# Patient Record
Sex: Female | Born: 1945
Health system: Southern US, Community
[De-identification: ages and names within clinical notes are randomized; demographics above are authoritative.]

## PROBLEM LIST (undated history)

## (undated) DIAGNOSIS — J45909 Unspecified asthma, uncomplicated: Secondary | ICD-10-CM

## (undated) DIAGNOSIS — G4730 Sleep apnea, unspecified: Secondary | ICD-10-CM

## (undated) DIAGNOSIS — E119 Type 2 diabetes mellitus without complications: Secondary | ICD-10-CM

## (undated) DIAGNOSIS — M1990 Unspecified osteoarthritis, unspecified site: Secondary | ICD-10-CM

## (undated) HISTORY — PX: ABDOMINAL HYSTERECTOMY: SHX81

## (undated) HISTORY — DX: Unspecified asthma, uncomplicated: J45.909

## (undated) HISTORY — PX: BACK SURGERY: SHX140

## (undated) HISTORY — PX: BREAST SURGERY: SHX581

## (undated) HISTORY — PX: NASAL SINUS SURGERY: SHX719

## (undated) HISTORY — DX: Type 2 diabetes mellitus without complications: E11.9

## (undated) HISTORY — DX: Unspecified osteoarthritis, unspecified site: M19.90

## (undated) HISTORY — PX: CHOLECYSTECTOMY: SHX55

## (undated) HISTORY — PX: SHOULDER ARTHROSCOPY W/ ROTATOR CUFF REPAIR: SHX2400

---

## 1999-03-13 DIAGNOSIS — C4491 Basal cell carcinoma of skin, unspecified: Secondary | ICD-10-CM

## 1999-03-13 HISTORY — DX: Basal cell carcinoma of skin, unspecified: C44.91

## 2001-03-04 ENCOUNTER — Other Ambulatory Visit: Admission: RE | Admit: 2001-03-04 | Discharge: 2001-03-04 | Payer: Self-pay | Admitting: Obstetrics and Gynecology

## 2004-10-12 ENCOUNTER — Ambulatory Visit: Payer: Self-pay | Admitting: Internal Medicine

## 2004-10-12 ENCOUNTER — Ambulatory Visit (HOSPITAL_COMMUNITY): Admission: RE | Admit: 2004-10-12 | Discharge: 2004-10-12 | Payer: Self-pay | Admitting: Internal Medicine

## 2006-03-21 DIAGNOSIS — D229 Melanocytic nevi, unspecified: Secondary | ICD-10-CM

## 2006-03-21 HISTORY — DX: Melanocytic nevi, unspecified: D22.9

## 2007-09-22 ENCOUNTER — Other Ambulatory Visit: Admission: RE | Admit: 2007-09-22 | Discharge: 2007-09-22 | Payer: Self-pay | Admitting: Obstetrics and Gynecology

## 2007-12-01 ENCOUNTER — Ambulatory Visit: Payer: Self-pay | Admitting: Cardiology

## 2010-05-07 ENCOUNTER — Ambulatory Visit: Payer: Self-pay | Admitting: Cardiology

## 2010-12-21 NOTE — Op Note (Signed)
NAME:  Dawn Anderson, Dawn Anderson               ACCOUNT NO.:  [id number]   MEDICAL RECORD NO.:  [id number]          PATIENT TYPE:  AMB   LOCATION:  DAY                           FACILITY:  [facility]   PHYSICIAN:  Lionel December, M.D.    DATE OF BIRTH:  [birth year]   DATE OF PROCEDURE:  10/12/2004  DATE OF DISCHARGE:                                 OPERATIVE REPORT   PROCEDURE:  Total colonoscopy.   INDICATIONS:  Dawn Anderson is a 65 year old Caucasian female who is here for  surveillance colonoscopy.  She had two small tubular adenomas removed from  her colon, last exam of January 2001.  Family history is negative for colon  carcinoma, but her mother has had polyps as well.   The procedure risks were reviewed the patient, informed consent was  obtained.   PREMEDICATION:  Demerol 25 mg IV, Versed 8 mg IV in divided dose.   FINDINGS:  Procedure performed in endoscopy suite.  The patient's vital  signs and O2 saturation were monitored during procedure and remained stable.  The patient was placed in the left lateral recumbent position and rectal  examination performed.  No abnormality noted on an external or digital exam.  The Olympus video scope was placed in the rectum and advanced under vision  into sigmoid colon and beyond.  Preparation was satisfactory.  She had mild  pigmentation mainly in distal half the colon, consistent with melanosis  coli.  Redundant colon.  The scope was advanced into cecum, which was  identified by appendiceal orifice and ileocecal valve.  Pictures taken for  the record.  As the scope was withdrawn, colonic mucosa was carefully  examined and there was only a small polyp at rectum, which was ablated via  cold biopsy, and a tiny bump next to it.  The scope was retroflexed to  examine anorectal junction, and small hemorrhoids were noted below the  dentate line.  Endoscope was straightened and withdrawn.  The patient  tolerated the procedure well.   FINAL DIAGNOSES:  1.  Redundant  colon with mild melanosis coli.  2.  Single small rectal polyp, which was ablated via cold biopsy.  3.  Small external hemorrhoids.   RECOMMENDATIONS:  1.  High-fiber diet. She will continue Metamucil or equivalent one      tablespoonful daily.  2.  I will contact the patient with biopsy results and will bring her back      in five years from now.      NR/MEDQ  D:  10/12/2004  T:  10/12/2004  Job:  [id number]   cc:   Selinda Flavin  [address]  [place]  [place] [address]  Fax: [phone]

## 2011-08-07 DIAGNOSIS — H918X9 Other specified hearing loss, unspecified ear: Secondary | ICD-10-CM | POA: Diagnosis not present

## 2011-08-07 DIAGNOSIS — R259 Unspecified abnormal involuntary movements: Secondary | ICD-10-CM | POA: Diagnosis not present

## 2011-08-07 DIAGNOSIS — R0609 Other forms of dyspnea: Secondary | ICD-10-CM | POA: Diagnosis not present

## 2011-08-13 DIAGNOSIS — D2370 Other benign neoplasm of skin of unspecified lower limb, including hip: Secondary | ICD-10-CM | POA: Diagnosis not present

## 2011-08-13 DIAGNOSIS — D485 Neoplasm of uncertain behavior of skin: Secondary | ICD-10-CM | POA: Diagnosis not present

## 2011-08-13 DIAGNOSIS — L820 Inflamed seborrheic keratosis: Secondary | ICD-10-CM | POA: Diagnosis not present

## 2011-08-15 DIAGNOSIS — J45909 Unspecified asthma, uncomplicated: Secondary | ICD-10-CM | POA: Diagnosis not present

## 2011-08-27 DIAGNOSIS — J310 Chronic rhinitis: Secondary | ICD-10-CM | POA: Diagnosis not present

## 2011-08-27 DIAGNOSIS — J3089 Other allergic rhinitis: Secondary | ICD-10-CM | POA: Diagnosis not present

## 2011-08-27 DIAGNOSIS — J301 Allergic rhinitis due to pollen: Secondary | ICD-10-CM | POA: Diagnosis not present

## 2011-08-27 DIAGNOSIS — J45909 Unspecified asthma, uncomplicated: Secondary | ICD-10-CM | POA: Diagnosis not present

## 2011-09-12 DIAGNOSIS — J45909 Unspecified asthma, uncomplicated: Secondary | ICD-10-CM | POA: Diagnosis not present

## 2011-09-26 DIAGNOSIS — J3089 Other allergic rhinitis: Secondary | ICD-10-CM | POA: Diagnosis not present

## 2011-10-18 DIAGNOSIS — J3089 Other allergic rhinitis: Secondary | ICD-10-CM | POA: Diagnosis not present

## 2011-10-18 DIAGNOSIS — J45909 Unspecified asthma, uncomplicated: Secondary | ICD-10-CM | POA: Diagnosis not present

## 2011-11-06 DIAGNOSIS — M999 Biomechanical lesion, unspecified: Secondary | ICD-10-CM | POA: Diagnosis not present

## 2011-11-06 DIAGNOSIS — S335XXA Sprain of ligaments of lumbar spine, initial encounter: Secondary | ICD-10-CM | POA: Diagnosis not present

## 2011-11-21 DIAGNOSIS — J45909 Unspecified asthma, uncomplicated: Secondary | ICD-10-CM | POA: Diagnosis not present

## 2011-11-26 DIAGNOSIS — Z1212 Encounter for screening for malignant neoplasm of rectum: Secondary | ICD-10-CM | POA: Diagnosis not present

## 2011-11-26 DIAGNOSIS — Z124 Encounter for screening for malignant neoplasm of cervix: Secondary | ICD-10-CM | POA: Diagnosis not present

## 2011-11-26 DIAGNOSIS — Z1389 Encounter for screening for other disorder: Secondary | ICD-10-CM | POA: Diagnosis not present

## 2011-12-03 DIAGNOSIS — J3081 Allergic rhinitis due to animal (cat) (dog) hair and dander: Secondary | ICD-10-CM | POA: Diagnosis not present

## 2011-12-03 DIAGNOSIS — J45909 Unspecified asthma, uncomplicated: Secondary | ICD-10-CM | POA: Diagnosis not present

## 2011-12-03 DIAGNOSIS — J310 Chronic rhinitis: Secondary | ICD-10-CM | POA: Diagnosis not present

## 2011-12-03 DIAGNOSIS — J3089 Other allergic rhinitis: Secondary | ICD-10-CM | POA: Diagnosis not present

## 2011-12-03 DIAGNOSIS — J301 Allergic rhinitis due to pollen: Secondary | ICD-10-CM | POA: Diagnosis not present

## 2011-12-09 DIAGNOSIS — M999 Biomechanical lesion, unspecified: Secondary | ICD-10-CM | POA: Diagnosis not present

## 2011-12-09 DIAGNOSIS — S335XXA Sprain of ligaments of lumbar spine, initial encounter: Secondary | ICD-10-CM | POA: Diagnosis not present

## 2011-12-23 DIAGNOSIS — J45909 Unspecified asthma, uncomplicated: Secondary | ICD-10-CM | POA: Diagnosis not present

## 2011-12-31 DIAGNOSIS — M999 Biomechanical lesion, unspecified: Secondary | ICD-10-CM | POA: Diagnosis not present

## 2011-12-31 DIAGNOSIS — S335XXA Sprain of ligaments of lumbar spine, initial encounter: Secondary | ICD-10-CM | POA: Diagnosis not present

## 2012-01-09 DIAGNOSIS — Z1231 Encounter for screening mammogram for malignant neoplasm of breast: Secondary | ICD-10-CM | POA: Diagnosis not present

## 2012-01-16 DIAGNOSIS — J45909 Unspecified asthma, uncomplicated: Secondary | ICD-10-CM | POA: Diagnosis not present

## 2012-02-03 DIAGNOSIS — M47817 Spondylosis without myelopathy or radiculopathy, lumbosacral region: Secondary | ICD-10-CM | POA: Diagnosis not present

## 2012-02-03 DIAGNOSIS — M5430 Sciatica, unspecified side: Secondary | ICD-10-CM | POA: Diagnosis not present

## 2012-02-03 DIAGNOSIS — M999 Biomechanical lesion, unspecified: Secondary | ICD-10-CM | POA: Diagnosis not present

## 2012-02-14 DIAGNOSIS — J45909 Unspecified asthma, uncomplicated: Secondary | ICD-10-CM | POA: Diagnosis not present

## 2012-03-09 DIAGNOSIS — M47817 Spondylosis without myelopathy or radiculopathy, lumbosacral region: Secondary | ICD-10-CM | POA: Diagnosis not present

## 2012-03-09 DIAGNOSIS — M999 Biomechanical lesion, unspecified: Secondary | ICD-10-CM | POA: Diagnosis not present

## 2012-03-17 DIAGNOSIS — L570 Actinic keratosis: Secondary | ICD-10-CM | POA: Diagnosis not present

## 2012-03-18 DIAGNOSIS — J45909 Unspecified asthma, uncomplicated: Secondary | ICD-10-CM | POA: Diagnosis not present

## 2012-03-23 DIAGNOSIS — M47817 Spondylosis without myelopathy or radiculopathy, lumbosacral region: Secondary | ICD-10-CM | POA: Diagnosis not present

## 2012-03-23 DIAGNOSIS — M999 Biomechanical lesion, unspecified: Secondary | ICD-10-CM | POA: Diagnosis not present

## 2012-03-25 DIAGNOSIS — M999 Biomechanical lesion, unspecified: Secondary | ICD-10-CM | POA: Diagnosis not present

## 2012-03-25 DIAGNOSIS — M47817 Spondylosis without myelopathy or radiculopathy, lumbosacral region: Secondary | ICD-10-CM | POA: Diagnosis not present

## 2012-03-27 DIAGNOSIS — M999 Biomechanical lesion, unspecified: Secondary | ICD-10-CM | POA: Diagnosis not present

## 2012-03-27 DIAGNOSIS — M47817 Spondylosis without myelopathy or radiculopathy, lumbosacral region: Secondary | ICD-10-CM | POA: Diagnosis not present

## 2012-03-30 DIAGNOSIS — M47817 Spondylosis without myelopathy or radiculopathy, lumbosacral region: Secondary | ICD-10-CM | POA: Diagnosis not present

## 2012-03-30 DIAGNOSIS — M999 Biomechanical lesion, unspecified: Secondary | ICD-10-CM | POA: Diagnosis not present

## 2012-04-01 DIAGNOSIS — M47817 Spondylosis without myelopathy or radiculopathy, lumbosacral region: Secondary | ICD-10-CM | POA: Diagnosis not present

## 2012-04-01 DIAGNOSIS — M999 Biomechanical lesion, unspecified: Secondary | ICD-10-CM | POA: Diagnosis not present

## 2012-04-14 DIAGNOSIS — L570 Actinic keratosis: Secondary | ICD-10-CM | POA: Diagnosis not present

## 2012-04-20 DIAGNOSIS — J45909 Unspecified asthma, uncomplicated: Secondary | ICD-10-CM | POA: Diagnosis not present

## 2012-04-21 DIAGNOSIS — L820 Inflamed seborrheic keratosis: Secondary | ICD-10-CM | POA: Diagnosis not present

## 2012-04-21 DIAGNOSIS — D235 Other benign neoplasm of skin of trunk: Secondary | ICD-10-CM | POA: Diagnosis not present

## 2012-04-21 DIAGNOSIS — D239 Other benign neoplasm of skin, unspecified: Secondary | ICD-10-CM | POA: Diagnosis not present

## 2012-04-21 DIAGNOSIS — D179 Benign lipomatous neoplasm, unspecified: Secondary | ICD-10-CM | POA: Diagnosis not present

## 2012-04-23 DIAGNOSIS — J45909 Unspecified asthma, uncomplicated: Secondary | ICD-10-CM | POA: Diagnosis not present

## 2012-04-23 DIAGNOSIS — J3089 Other allergic rhinitis: Secondary | ICD-10-CM | POA: Diagnosis not present

## 2012-04-23 DIAGNOSIS — J339 Nasal polyp, unspecified: Secondary | ICD-10-CM | POA: Diagnosis not present

## 2012-04-23 DIAGNOSIS — T39095A Adverse effect of salicylates, initial encounter: Secondary | ICD-10-CM | POA: Diagnosis not present

## 2012-04-28 DIAGNOSIS — L570 Actinic keratosis: Secondary | ICD-10-CM | POA: Diagnosis not present

## 2012-05-07 DIAGNOSIS — Z23 Encounter for immunization: Secondary | ICD-10-CM | POA: Diagnosis not present

## 2012-05-18 DIAGNOSIS — J45909 Unspecified asthma, uncomplicated: Secondary | ICD-10-CM | POA: Diagnosis not present

## 2012-05-26 DIAGNOSIS — L570 Actinic keratosis: Secondary | ICD-10-CM | POA: Diagnosis not present

## 2012-06-02 DIAGNOSIS — H2510 Age-related nuclear cataract, unspecified eye: Secondary | ICD-10-CM | POA: Diagnosis not present

## 2012-06-02 DIAGNOSIS — H40009 Preglaucoma, unspecified, unspecified eye: Secondary | ICD-10-CM | POA: Diagnosis not present

## 2012-06-11 DIAGNOSIS — S335XXA Sprain of ligaments of lumbar spine, initial encounter: Secondary | ICD-10-CM | POA: Diagnosis not present

## 2012-06-11 DIAGNOSIS — M999 Biomechanical lesion, unspecified: Secondary | ICD-10-CM | POA: Diagnosis not present

## 2012-06-11 DIAGNOSIS — M47817 Spondylosis without myelopathy or radiculopathy, lumbosacral region: Secondary | ICD-10-CM | POA: Diagnosis not present

## 2012-06-16 DIAGNOSIS — L570 Actinic keratosis: Secondary | ICD-10-CM | POA: Diagnosis not present

## 2012-06-18 DIAGNOSIS — J45909 Unspecified asthma, uncomplicated: Secondary | ICD-10-CM | POA: Diagnosis not present

## 2012-07-07 DIAGNOSIS — L570 Actinic keratosis: Secondary | ICD-10-CM | POA: Diagnosis not present

## 2012-07-16 DIAGNOSIS — J45909 Unspecified asthma, uncomplicated: Secondary | ICD-10-CM | POA: Diagnosis not present

## 2012-07-30 ENCOUNTER — Encounter (INDEPENDENT_AMBULATORY_CARE_PROVIDER_SITE_OTHER): Payer: Self-pay

## 2012-08-03 DIAGNOSIS — S335XXA Sprain of ligaments of lumbar spine, initial encounter: Secondary | ICD-10-CM | POA: Diagnosis not present

## 2012-08-03 DIAGNOSIS — L570 Actinic keratosis: Secondary | ICD-10-CM | POA: Diagnosis not present

## 2012-08-03 DIAGNOSIS — M999 Biomechanical lesion, unspecified: Secondary | ICD-10-CM | POA: Diagnosis not present

## 2012-08-03 DIAGNOSIS — M47817 Spondylosis without myelopathy or radiculopathy, lumbosacral region: Secondary | ICD-10-CM | POA: Diagnosis not present

## 2012-08-17 DIAGNOSIS — L570 Actinic keratosis: Secondary | ICD-10-CM | POA: Diagnosis not present

## 2012-08-21 DIAGNOSIS — R0989 Other specified symptoms and signs involving the circulatory and respiratory systems: Secondary | ICD-10-CM | POA: Diagnosis not present

## 2012-08-21 DIAGNOSIS — I6529 Occlusion and stenosis of unspecified carotid artery: Secondary | ICD-10-CM | POA: Diagnosis not present

## 2012-09-01 DIAGNOSIS — S335XXA Sprain of ligaments of lumbar spine, initial encounter: Secondary | ICD-10-CM | POA: Diagnosis not present

## 2012-09-01 DIAGNOSIS — M47817 Spondylosis without myelopathy or radiculopathy, lumbosacral region: Secondary | ICD-10-CM | POA: Diagnosis not present

## 2012-09-01 DIAGNOSIS — M999 Biomechanical lesion, unspecified: Secondary | ICD-10-CM | POA: Diagnosis not present

## 2012-09-15 DIAGNOSIS — L570 Actinic keratosis: Secondary | ICD-10-CM | POA: Diagnosis not present

## 2012-10-01 DIAGNOSIS — H6120 Impacted cerumen, unspecified ear: Secondary | ICD-10-CM | POA: Diagnosis not present

## 2012-10-01 DIAGNOSIS — H6980 Other specified disorders of Eustachian tube, unspecified ear: Secondary | ICD-10-CM | POA: Diagnosis not present

## 2012-10-06 DIAGNOSIS — L570 Actinic keratosis: Secondary | ICD-10-CM | POA: Diagnosis not present

## 2012-10-12 DIAGNOSIS — M999 Biomechanical lesion, unspecified: Secondary | ICD-10-CM | POA: Diagnosis not present

## 2012-10-12 DIAGNOSIS — M47817 Spondylosis without myelopathy or radiculopathy, lumbosacral region: Secondary | ICD-10-CM | POA: Diagnosis not present

## 2012-10-12 DIAGNOSIS — M5430 Sciatica, unspecified side: Secondary | ICD-10-CM | POA: Diagnosis not present

## 2012-10-27 DIAGNOSIS — L570 Actinic keratosis: Secondary | ICD-10-CM | POA: Diagnosis not present

## 2012-10-29 DIAGNOSIS — M5430 Sciatica, unspecified side: Secondary | ICD-10-CM | POA: Diagnosis not present

## 2012-10-29 DIAGNOSIS — M999 Biomechanical lesion, unspecified: Secondary | ICD-10-CM | POA: Diagnosis not present

## 2012-10-29 DIAGNOSIS — M47817 Spondylosis without myelopathy or radiculopathy, lumbosacral region: Secondary | ICD-10-CM | POA: Diagnosis not present

## 2012-11-02 DIAGNOSIS — M47817 Spondylosis without myelopathy or radiculopathy, lumbosacral region: Secondary | ICD-10-CM | POA: Diagnosis not present

## 2012-11-02 DIAGNOSIS — M5430 Sciatica, unspecified side: Secondary | ICD-10-CM | POA: Diagnosis not present

## 2012-11-02 DIAGNOSIS — M999 Biomechanical lesion, unspecified: Secondary | ICD-10-CM | POA: Diagnosis not present

## 2012-11-03 DIAGNOSIS — M47817 Spondylosis without myelopathy or radiculopathy, lumbosacral region: Secondary | ICD-10-CM | POA: Diagnosis not present

## 2012-11-03 DIAGNOSIS — M999 Biomechanical lesion, unspecified: Secondary | ICD-10-CM | POA: Diagnosis not present

## 2012-11-03 DIAGNOSIS — M5430 Sciatica, unspecified side: Secondary | ICD-10-CM | POA: Diagnosis not present

## 2012-11-05 DIAGNOSIS — M5430 Sciatica, unspecified side: Secondary | ICD-10-CM | POA: Diagnosis not present

## 2012-11-05 DIAGNOSIS — M47817 Spondylosis without myelopathy or radiculopathy, lumbosacral region: Secondary | ICD-10-CM | POA: Diagnosis not present

## 2012-11-05 DIAGNOSIS — M999 Biomechanical lesion, unspecified: Secondary | ICD-10-CM | POA: Diagnosis not present

## 2012-11-10 DIAGNOSIS — M47817 Spondylosis without myelopathy or radiculopathy, lumbosacral region: Secondary | ICD-10-CM | POA: Diagnosis not present

## 2012-11-10 DIAGNOSIS — M5430 Sciatica, unspecified side: Secondary | ICD-10-CM | POA: Diagnosis not present

## 2012-11-10 DIAGNOSIS — M999 Biomechanical lesion, unspecified: Secondary | ICD-10-CM | POA: Diagnosis not present

## 2012-11-17 DIAGNOSIS — M5430 Sciatica, unspecified side: Secondary | ICD-10-CM | POA: Diagnosis not present

## 2012-11-17 DIAGNOSIS — M47817 Spondylosis without myelopathy or radiculopathy, lumbosacral region: Secondary | ICD-10-CM | POA: Diagnosis not present

## 2012-11-17 DIAGNOSIS — M999 Biomechanical lesion, unspecified: Secondary | ICD-10-CM | POA: Diagnosis not present

## 2012-11-18 DIAGNOSIS — G4700 Insomnia, unspecified: Secondary | ICD-10-CM | POA: Diagnosis not present

## 2012-11-18 DIAGNOSIS — Z23 Encounter for immunization: Secondary | ICD-10-CM | POA: Diagnosis not present

## 2012-11-18 DIAGNOSIS — J45909 Unspecified asthma, uncomplicated: Secondary | ICD-10-CM | POA: Diagnosis not present

## 2012-11-19 DIAGNOSIS — J45909 Unspecified asthma, uncomplicated: Secondary | ICD-10-CM | POA: Diagnosis not present

## 2012-11-19 DIAGNOSIS — J3089 Other allergic rhinitis: Secondary | ICD-10-CM | POA: Diagnosis not present

## 2012-11-19 DIAGNOSIS — G4700 Insomnia, unspecified: Secondary | ICD-10-CM | POA: Diagnosis not present

## 2012-11-19 DIAGNOSIS — T39095A Adverse effect of salicylates, initial encounter: Secondary | ICD-10-CM | POA: Diagnosis not present

## 2012-11-19 DIAGNOSIS — J339 Nasal polyp, unspecified: Secondary | ICD-10-CM | POA: Diagnosis not present

## 2012-12-01 DIAGNOSIS — L570 Actinic keratosis: Secondary | ICD-10-CM | POA: Diagnosis not present

## 2012-12-07 DIAGNOSIS — M47817 Spondylosis without myelopathy or radiculopathy, lumbosacral region: Secondary | ICD-10-CM | POA: Diagnosis not present

## 2012-12-07 DIAGNOSIS — M999 Biomechanical lesion, unspecified: Secondary | ICD-10-CM | POA: Diagnosis not present

## 2012-12-07 DIAGNOSIS — M5430 Sciatica, unspecified side: Secondary | ICD-10-CM | POA: Diagnosis not present

## 2012-12-08 DIAGNOSIS — M5430 Sciatica, unspecified side: Secondary | ICD-10-CM | POA: Diagnosis not present

## 2012-12-08 DIAGNOSIS — M999 Biomechanical lesion, unspecified: Secondary | ICD-10-CM | POA: Diagnosis not present

## 2012-12-08 DIAGNOSIS — M47817 Spondylosis without myelopathy or radiculopathy, lumbosacral region: Secondary | ICD-10-CM | POA: Diagnosis not present

## 2012-12-16 DIAGNOSIS — J45909 Unspecified asthma, uncomplicated: Secondary | ICD-10-CM | POA: Diagnosis not present

## 2013-01-05 DIAGNOSIS — M5430 Sciatica, unspecified side: Secondary | ICD-10-CM | POA: Diagnosis not present

## 2013-01-05 DIAGNOSIS — M47817 Spondylosis without myelopathy or radiculopathy, lumbosacral region: Secondary | ICD-10-CM | POA: Diagnosis not present

## 2013-01-05 DIAGNOSIS — M999 Biomechanical lesion, unspecified: Secondary | ICD-10-CM | POA: Diagnosis not present

## 2013-01-05 DIAGNOSIS — M722 Plantar fascial fibromatosis: Secondary | ICD-10-CM | POA: Diagnosis not present

## 2013-01-11 DIAGNOSIS — Z1231 Encounter for screening mammogram for malignant neoplasm of breast: Secondary | ICD-10-CM | POA: Diagnosis not present

## 2013-01-14 DIAGNOSIS — J45909 Unspecified asthma, uncomplicated: Secondary | ICD-10-CM | POA: Diagnosis not present

## 2013-02-16 DIAGNOSIS — J45909 Unspecified asthma, uncomplicated: Secondary | ICD-10-CM | POA: Diagnosis not present

## 2013-03-22 DIAGNOSIS — J45909 Unspecified asthma, uncomplicated: Secondary | ICD-10-CM | POA: Diagnosis not present

## 2013-03-29 DIAGNOSIS — M5430 Sciatica, unspecified side: Secondary | ICD-10-CM | POA: Diagnosis not present

## 2013-03-29 DIAGNOSIS — M47817 Spondylosis without myelopathy or radiculopathy, lumbosacral region: Secondary | ICD-10-CM | POA: Diagnosis not present

## 2013-03-29 DIAGNOSIS — M999 Biomechanical lesion, unspecified: Secondary | ICD-10-CM | POA: Diagnosis not present

## 2013-03-29 DIAGNOSIS — M722 Plantar fascial fibromatosis: Secondary | ICD-10-CM | POA: Diagnosis not present

## 2013-03-30 DIAGNOSIS — D235 Other benign neoplasm of skin of trunk: Secondary | ICD-10-CM | POA: Diagnosis not present

## 2013-03-30 DIAGNOSIS — L820 Inflamed seborrheic keratosis: Secondary | ICD-10-CM | POA: Diagnosis not present

## 2013-04-19 DIAGNOSIS — H6980 Other specified disorders of Eustachian tube, unspecified ear: Secondary | ICD-10-CM | POA: Diagnosis not present

## 2013-04-22 DIAGNOSIS — M5430 Sciatica, unspecified side: Secondary | ICD-10-CM | POA: Diagnosis not present

## 2013-04-22 DIAGNOSIS — M722 Plantar fascial fibromatosis: Secondary | ICD-10-CM | POA: Diagnosis not present

## 2013-04-22 DIAGNOSIS — M47817 Spondylosis without myelopathy or radiculopathy, lumbosacral region: Secondary | ICD-10-CM | POA: Diagnosis not present

## 2013-04-22 DIAGNOSIS — M999 Biomechanical lesion, unspecified: Secondary | ICD-10-CM | POA: Diagnosis not present

## 2013-04-26 DIAGNOSIS — J45909 Unspecified asthma, uncomplicated: Secondary | ICD-10-CM | POA: Diagnosis not present

## 2013-05-20 DIAGNOSIS — J339 Nasal polyp, unspecified: Secondary | ICD-10-CM | POA: Diagnosis not present

## 2013-05-20 DIAGNOSIS — T39095A Adverse effect of salicylates, initial encounter: Secondary | ICD-10-CM | POA: Diagnosis not present

## 2013-05-20 DIAGNOSIS — J3089 Other allergic rhinitis: Secondary | ICD-10-CM | POA: Diagnosis not present

## 2013-05-20 DIAGNOSIS — J45909 Unspecified asthma, uncomplicated: Secondary | ICD-10-CM | POA: Diagnosis not present

## 2013-05-20 DIAGNOSIS — Z23 Encounter for immunization: Secondary | ICD-10-CM | POA: Diagnosis not present

## 2013-05-24 DIAGNOSIS — J45909 Unspecified asthma, uncomplicated: Secondary | ICD-10-CM | POA: Diagnosis not present

## 2013-05-27 DIAGNOSIS — H2510 Age-related nuclear cataract, unspecified eye: Secondary | ICD-10-CM | POA: Diagnosis not present

## 2013-05-27 DIAGNOSIS — H538 Other visual disturbances: Secondary | ICD-10-CM | POA: Diagnosis not present

## 2013-05-27 DIAGNOSIS — H40009 Preglaucoma, unspecified, unspecified eye: Secondary | ICD-10-CM | POA: Diagnosis not present

## 2013-06-24 DIAGNOSIS — J45909 Unspecified asthma, uncomplicated: Secondary | ICD-10-CM | POA: Diagnosis not present

## 2013-07-07 DIAGNOSIS — M47817 Spondylosis without myelopathy or radiculopathy, lumbosacral region: Secondary | ICD-10-CM | POA: Diagnosis not present

## 2013-07-07 DIAGNOSIS — M722 Plantar fascial fibromatosis: Secondary | ICD-10-CM | POA: Diagnosis not present

## 2013-07-07 DIAGNOSIS — M999 Biomechanical lesion, unspecified: Secondary | ICD-10-CM | POA: Diagnosis not present

## 2013-07-07 DIAGNOSIS — M5430 Sciatica, unspecified side: Secondary | ICD-10-CM | POA: Diagnosis not present

## 2013-07-19 DIAGNOSIS — M9981 Other biomechanical lesions of cervical region: Secondary | ICD-10-CM | POA: Diagnosis not present

## 2013-07-19 DIAGNOSIS — M999 Biomechanical lesion, unspecified: Secondary | ICD-10-CM | POA: Diagnosis not present

## 2013-07-19 DIAGNOSIS — M4712 Other spondylosis with myelopathy, cervical region: Secondary | ICD-10-CM | POA: Diagnosis not present

## 2013-07-19 DIAGNOSIS — M546 Pain in thoracic spine: Secondary | ICD-10-CM | POA: Diagnosis not present

## 2013-07-19 DIAGNOSIS — M722 Plantar fascial fibromatosis: Secondary | ICD-10-CM | POA: Diagnosis not present

## 2013-07-19 DIAGNOSIS — M47817 Spondylosis without myelopathy or radiculopathy, lumbosacral region: Secondary | ICD-10-CM | POA: Diagnosis not present

## 2013-07-22 DIAGNOSIS — M1710 Unilateral primary osteoarthritis, unspecified knee: Secondary | ICD-10-CM | POA: Diagnosis not present

## 2013-07-22 DIAGNOSIS — J45909 Unspecified asthma, uncomplicated: Secondary | ICD-10-CM | POA: Diagnosis not present

## 2013-08-25 DIAGNOSIS — J45909 Unspecified asthma, uncomplicated: Secondary | ICD-10-CM | POA: Diagnosis not present

## 2013-09-23 DIAGNOSIS — J45909 Unspecified asthma, uncomplicated: Secondary | ICD-10-CM | POA: Diagnosis not present

## 2013-09-27 DIAGNOSIS — M722 Plantar fascial fibromatosis: Secondary | ICD-10-CM | POA: Diagnosis not present

## 2013-09-27 DIAGNOSIS — M9981 Other biomechanical lesions of cervical region: Secondary | ICD-10-CM | POA: Diagnosis not present

## 2013-09-27 DIAGNOSIS — M4712 Other spondylosis with myelopathy, cervical region: Secondary | ICD-10-CM | POA: Diagnosis not present

## 2013-09-27 DIAGNOSIS — M999 Biomechanical lesion, unspecified: Secondary | ICD-10-CM | POA: Diagnosis not present

## 2013-09-27 DIAGNOSIS — M47817 Spondylosis without myelopathy or radiculopathy, lumbosacral region: Secondary | ICD-10-CM | POA: Diagnosis not present

## 2013-09-27 DIAGNOSIS — M546 Pain in thoracic spine: Secondary | ICD-10-CM | POA: Diagnosis not present

## 2013-10-04 DIAGNOSIS — M722 Plantar fascial fibromatosis: Secondary | ICD-10-CM | POA: Diagnosis not present

## 2013-10-04 DIAGNOSIS — M4712 Other spondylosis with myelopathy, cervical region: Secondary | ICD-10-CM | POA: Diagnosis not present

## 2013-10-04 DIAGNOSIS — M999 Biomechanical lesion, unspecified: Secondary | ICD-10-CM | POA: Diagnosis not present

## 2013-10-04 DIAGNOSIS — M9981 Other biomechanical lesions of cervical region: Secondary | ICD-10-CM | POA: Diagnosis not present

## 2013-10-04 DIAGNOSIS — M546 Pain in thoracic spine: Secondary | ICD-10-CM | POA: Diagnosis not present

## 2013-10-04 DIAGNOSIS — M47817 Spondylosis without myelopathy or radiculopathy, lumbosacral region: Secondary | ICD-10-CM | POA: Diagnosis not present

## 2013-10-06 DIAGNOSIS — M546 Pain in thoracic spine: Secondary | ICD-10-CM | POA: Diagnosis not present

## 2013-10-06 DIAGNOSIS — M4712 Other spondylosis with myelopathy, cervical region: Secondary | ICD-10-CM | POA: Diagnosis not present

## 2013-10-06 DIAGNOSIS — M722 Plantar fascial fibromatosis: Secondary | ICD-10-CM | POA: Diagnosis not present

## 2013-10-06 DIAGNOSIS — M47817 Spondylosis without myelopathy or radiculopathy, lumbosacral region: Secondary | ICD-10-CM | POA: Diagnosis not present

## 2013-10-06 DIAGNOSIS — M9981 Other biomechanical lesions of cervical region: Secondary | ICD-10-CM | POA: Diagnosis not present

## 2013-10-06 DIAGNOSIS — M999 Biomechanical lesion, unspecified: Secondary | ICD-10-CM | POA: Diagnosis not present

## 2013-10-18 DIAGNOSIS — M4712 Other spondylosis with myelopathy, cervical region: Secondary | ICD-10-CM | POA: Diagnosis not present

## 2013-10-18 DIAGNOSIS — M722 Plantar fascial fibromatosis: Secondary | ICD-10-CM | POA: Diagnosis not present

## 2013-10-18 DIAGNOSIS — M546 Pain in thoracic spine: Secondary | ICD-10-CM | POA: Diagnosis not present

## 2013-10-18 DIAGNOSIS — M5430 Sciatica, unspecified side: Secondary | ICD-10-CM | POA: Diagnosis not present

## 2013-10-18 DIAGNOSIS — M999 Biomechanical lesion, unspecified: Secondary | ICD-10-CM | POA: Diagnosis not present

## 2013-10-18 DIAGNOSIS — M47817 Spondylosis without myelopathy or radiculopathy, lumbosacral region: Secondary | ICD-10-CM | POA: Diagnosis not present

## 2013-10-18 DIAGNOSIS — M9981 Other biomechanical lesions of cervical region: Secondary | ICD-10-CM | POA: Diagnosis not present

## 2013-10-20 DIAGNOSIS — J45909 Unspecified asthma, uncomplicated: Secondary | ICD-10-CM | POA: Diagnosis not present

## 2013-11-12 DIAGNOSIS — L259 Unspecified contact dermatitis, unspecified cause: Secondary | ICD-10-CM | POA: Diagnosis not present

## 2013-11-12 DIAGNOSIS — L919 Hypertrophic disorder of the skin, unspecified: Secondary | ICD-10-CM | POA: Diagnosis not present

## 2013-11-12 DIAGNOSIS — L909 Atrophic disorder of skin, unspecified: Secondary | ICD-10-CM | POA: Diagnosis not present

## 2013-11-12 DIAGNOSIS — D235 Other benign neoplasm of skin of trunk: Secondary | ICD-10-CM | POA: Diagnosis not present

## 2013-11-22 DIAGNOSIS — J45909 Unspecified asthma, uncomplicated: Secondary | ICD-10-CM | POA: Diagnosis not present

## 2013-11-23 DIAGNOSIS — T39095A Adverse effect of salicylates, initial encounter: Secondary | ICD-10-CM | POA: Diagnosis not present

## 2013-11-23 DIAGNOSIS — J45909 Unspecified asthma, uncomplicated: Secondary | ICD-10-CM | POA: Diagnosis not present

## 2013-11-23 DIAGNOSIS — J339 Nasal polyp, unspecified: Secondary | ICD-10-CM | POA: Diagnosis not present

## 2013-11-23 DIAGNOSIS — J3089 Other allergic rhinitis: Secondary | ICD-10-CM | POA: Diagnosis not present

## 2013-11-29 ENCOUNTER — Ambulatory Visit (INDEPENDENT_AMBULATORY_CARE_PROVIDER_SITE_OTHER): Payer: Medicare Other | Admitting: Adult Health

## 2013-11-29 ENCOUNTER — Encounter: Payer: Self-pay | Admitting: Adult Health

## 2013-11-29 VITALS — BP 132/84 | Ht 68.5 in

## 2013-11-29 DIAGNOSIS — Z01419 Encounter for gynecological examination (general) (routine) without abnormal findings: Secondary | ICD-10-CM

## 2013-11-29 DIAGNOSIS — Z1212 Encounter for screening for malignant neoplasm of rectum: Secondary | ICD-10-CM

## 2013-11-29 NOTE — Progress Notes (Signed)
Patient ID: Dawn Anderson, female   DOB: 1945-12-08, 68 y.o.   MRN: [id number] History of Present Illness: Dawn Anderson is a 68 year old white female, in for a physical.No complaints today.   Current Medications, Allergies, Past Medical History, Past Surgical History, Family History and Social History were reviewed in [employer] record.   Past Medical History  Diagnosis Date  . Asthma    Past Surgical History  Procedure Laterality Date  . Abdominal hysterectomy    . Shoulder arthroscopy w/ rotator cuff repair Right   . Back surgery      x2  . Breast surgery Left     lump removed  . Cholecystectomy    . Nasal sinus surgery    Current outpatient prescriptions:montelukast (SINGULAIR) 10 MG tablet, , Disp: , Rfl: ;  SYMBICORT 160-4.5 MCG/ACT inhaler, , Disp: , Rfl: ;  XOLAIR 150 MG injection, , Disp: , Rfl: ;  XOPENEX HFA 45 MCG/ACT inhaler, , Disp: , Rfl:   Review of Systems: Patient denies any headaches, blurred vision, shortness of breath, chest pain, abdominal pain, problems with bowel movements, urination, or intercourse, not having sex.No joint swelling or mood swings.Son in [place] had [place] in February and she has cared for him.    Physical Exam:BP 132/84  Ht 5' 8.5" (1.74 m) General:  Well developed, well nourished, no acute distress Skin:  Warm and dry Neck:  Midline trachea, normal thyroid Lungs; Clear to auscultation bilaterally Breast:  No dominant palpable mass, retraction, or nipple discharge Cardiovascular: Regular rate and rhythm Abdomen:  Soft, non tender, no hepatosplenomegaly Pelvic:  External genitalia is normal in appearance.  The vagina is normal in appearance for age.. The cervix and uterus are absent.No  adnexal masses or tenderness noted. Rectal: Good sphincter tone, no polyps, or hemorrhoids felt.  Hemoccult negative. Extremities:  No swelling or varicosities noted Psych:  No mood changes,alert and cooperative,seems  happy   Impression:  Yearly gyn exam no pap   Plan: Physical in 2 years Mammogram yearly Colonoscopy per Dr Laural Golden Labs with Dr Nadara Mustard

## 2013-11-29 NOTE — Patient Instructions (Signed)
Physical in  2 years Mammogram yearly  Colonoscopy as per Dr Trenton Gammon with PCP

## 2013-12-16 DIAGNOSIS — Z23 Encounter for immunization: Secondary | ICD-10-CM | POA: Diagnosis not present

## 2013-12-16 DIAGNOSIS — J45909 Unspecified asthma, uncomplicated: Secondary | ICD-10-CM | POA: Diagnosis not present

## 2014-01-06 DIAGNOSIS — J45902 Unspecified asthma with status asthmaticus: Secondary | ICD-10-CM | POA: Diagnosis not present

## 2014-01-06 DIAGNOSIS — J0110 Acute frontal sinusitis, unspecified: Secondary | ICD-10-CM | POA: Diagnosis not present

## 2014-01-12 DIAGNOSIS — M47817 Spondylosis without myelopathy or radiculopathy, lumbosacral region: Secondary | ICD-10-CM | POA: Diagnosis not present

## 2014-01-12 DIAGNOSIS — M4712 Other spondylosis with myelopathy, cervical region: Secondary | ICD-10-CM | POA: Diagnosis not present

## 2014-01-12 DIAGNOSIS — M722 Plantar fascial fibromatosis: Secondary | ICD-10-CM | POA: Diagnosis not present

## 2014-01-12 DIAGNOSIS — Z1231 Encounter for screening mammogram for malignant neoplasm of breast: Secondary | ICD-10-CM | POA: Diagnosis not present

## 2014-01-12 DIAGNOSIS — M9981 Other biomechanical lesions of cervical region: Secondary | ICD-10-CM | POA: Diagnosis not present

## 2014-01-12 DIAGNOSIS — M999 Biomechanical lesion, unspecified: Secondary | ICD-10-CM | POA: Diagnosis not present

## 2014-01-12 DIAGNOSIS — M546 Pain in thoracic spine: Secondary | ICD-10-CM | POA: Diagnosis not present

## 2014-01-13 DIAGNOSIS — J45909 Unspecified asthma, uncomplicated: Secondary | ICD-10-CM | POA: Diagnosis not present

## 2014-02-15 DIAGNOSIS — J45909 Unspecified asthma, uncomplicated: Secondary | ICD-10-CM | POA: Diagnosis not present

## 2014-02-17 DIAGNOSIS — M546 Pain in thoracic spine: Secondary | ICD-10-CM | POA: Diagnosis not present

## 2014-02-17 DIAGNOSIS — M722 Plantar fascial fibromatosis: Secondary | ICD-10-CM | POA: Diagnosis not present

## 2014-02-17 DIAGNOSIS — M47817 Spondylosis without myelopathy or radiculopathy, lumbosacral region: Secondary | ICD-10-CM | POA: Diagnosis not present

## 2014-02-17 DIAGNOSIS — M9981 Other biomechanical lesions of cervical region: Secondary | ICD-10-CM | POA: Diagnosis not present

## 2014-02-17 DIAGNOSIS — M4712 Other spondylosis with myelopathy, cervical region: Secondary | ICD-10-CM | POA: Diagnosis not present

## 2014-02-17 DIAGNOSIS — M999 Biomechanical lesion, unspecified: Secondary | ICD-10-CM | POA: Diagnosis not present

## 2014-03-07 DIAGNOSIS — M999 Biomechanical lesion, unspecified: Secondary | ICD-10-CM | POA: Diagnosis not present

## 2014-03-07 DIAGNOSIS — M546 Pain in thoracic spine: Secondary | ICD-10-CM | POA: Diagnosis not present

## 2014-03-07 DIAGNOSIS — M9981 Other biomechanical lesions of cervical region: Secondary | ICD-10-CM | POA: Diagnosis not present

## 2014-03-07 DIAGNOSIS — M47817 Spondylosis without myelopathy or radiculopathy, lumbosacral region: Secondary | ICD-10-CM | POA: Diagnosis not present

## 2014-03-07 DIAGNOSIS — M722 Plantar fascial fibromatosis: Secondary | ICD-10-CM | POA: Diagnosis not present

## 2014-03-07 DIAGNOSIS — M4712 Other spondylosis with myelopathy, cervical region: Secondary | ICD-10-CM | POA: Diagnosis not present

## 2014-03-14 DIAGNOSIS — J45909 Unspecified asthma, uncomplicated: Secondary | ICD-10-CM | POA: Diagnosis not present

## 2014-03-28 DIAGNOSIS — M47817 Spondylosis without myelopathy or radiculopathy, lumbosacral region: Secondary | ICD-10-CM | POA: Diagnosis not present

## 2014-03-28 DIAGNOSIS — M9981 Other biomechanical lesions of cervical region: Secondary | ICD-10-CM | POA: Diagnosis not present

## 2014-03-28 DIAGNOSIS — M722 Plantar fascial fibromatosis: Secondary | ICD-10-CM | POA: Diagnosis not present

## 2014-03-28 DIAGNOSIS — M999 Biomechanical lesion, unspecified: Secondary | ICD-10-CM | POA: Diagnosis not present

## 2014-03-28 DIAGNOSIS — M546 Pain in thoracic spine: Secondary | ICD-10-CM | POA: Diagnosis not present

## 2014-03-28 DIAGNOSIS — M4712 Other spondylosis with myelopathy, cervical region: Secondary | ICD-10-CM | POA: Diagnosis not present

## 2014-04-18 DIAGNOSIS — J45909 Unspecified asthma, uncomplicated: Secondary | ICD-10-CM | POA: Diagnosis not present

## 2014-05-11 DIAGNOSIS — Z23 Encounter for immunization: Secondary | ICD-10-CM | POA: Diagnosis not present

## 2014-05-11 DIAGNOSIS — J4540 Moderate persistent asthma, uncomplicated: Secondary | ICD-10-CM | POA: Diagnosis not present

## 2014-05-11 DIAGNOSIS — T39095A Adverse effect of salicylates, initial encounter: Secondary | ICD-10-CM | POA: Diagnosis not present

## 2014-05-11 DIAGNOSIS — J309 Allergic rhinitis, unspecified: Secondary | ICD-10-CM | POA: Diagnosis not present

## 2014-05-19 DIAGNOSIS — J4542 Moderate persistent asthma with status asthmaticus: Secondary | ICD-10-CM | POA: Diagnosis not present

## 2014-06-06 ENCOUNTER — Encounter: Payer: Self-pay | Admitting: Adult Health

## 2014-06-15 DIAGNOSIS — J4542 Moderate persistent asthma with status asthmaticus: Secondary | ICD-10-CM | POA: Diagnosis not present

## 2014-06-21 DIAGNOSIS — J4542 Moderate persistent asthma with status asthmaticus: Secondary | ICD-10-CM | POA: Diagnosis not present

## 2014-06-22 DIAGNOSIS — H04123 Dry eye syndrome of bilateral lacrimal glands: Secondary | ICD-10-CM | POA: Diagnosis not present

## 2014-06-22 DIAGNOSIS — H2513 Age-related nuclear cataract, bilateral: Secondary | ICD-10-CM | POA: Diagnosis not present

## 2014-06-22 DIAGNOSIS — H538 Other visual disturbances: Secondary | ICD-10-CM | POA: Diagnosis not present

## 2014-06-24 DIAGNOSIS — J4540 Moderate persistent asthma, uncomplicated: Secondary | ICD-10-CM | POA: Diagnosis not present

## 2014-07-18 DIAGNOSIS — H40003 Preglaucoma, unspecified, bilateral: Secondary | ICD-10-CM | POA: Diagnosis not present

## 2014-07-25 DIAGNOSIS — J4550 Severe persistent asthma, uncomplicated: Secondary | ICD-10-CM | POA: Diagnosis not present

## 2014-07-25 DIAGNOSIS — H40003 Preglaucoma, unspecified, bilateral: Secondary | ICD-10-CM | POA: Diagnosis not present

## 2014-07-26 DIAGNOSIS — L859 Epidermal thickening, unspecified: Secondary | ICD-10-CM | POA: Diagnosis not present

## 2014-07-26 DIAGNOSIS — L820 Inflamed seborrheic keratosis: Secondary | ICD-10-CM | POA: Diagnosis not present

## 2014-07-26 DIAGNOSIS — D485 Neoplasm of uncertain behavior of skin: Secondary | ICD-10-CM | POA: Diagnosis not present

## 2014-07-26 DIAGNOSIS — D239 Other benign neoplasm of skin, unspecified: Secondary | ICD-10-CM | POA: Diagnosis not present

## 2014-08-01 DIAGNOSIS — Z9102 Food additives allergy status: Secondary | ICD-10-CM | POA: Diagnosis not present

## 2014-08-01 DIAGNOSIS — R0602 Shortness of breath: Secondary | ICD-10-CM | POA: Diagnosis not present

## 2014-08-01 DIAGNOSIS — E669 Obesity, unspecified: Secondary | ICD-10-CM | POA: Diagnosis not present

## 2014-08-01 DIAGNOSIS — Z885 Allergy status to narcotic agent status: Secondary | ICD-10-CM | POA: Diagnosis not present

## 2014-08-01 DIAGNOSIS — Z79899 Other long term (current) drug therapy: Secondary | ICD-10-CM | POA: Diagnosis not present

## 2014-08-01 DIAGNOSIS — M5137 Other intervertebral disc degeneration, lumbosacral region: Secondary | ICD-10-CM | POA: Diagnosis not present

## 2014-08-01 DIAGNOSIS — Z880 Allergy status to penicillin: Secondary | ICD-10-CM | POA: Diagnosis not present

## 2014-08-01 DIAGNOSIS — R200 Anesthesia of skin: Secondary | ICD-10-CM | POA: Diagnosis not present

## 2014-08-01 DIAGNOSIS — I10 Essential (primary) hypertension: Secondary | ICD-10-CM | POA: Diagnosis not present

## 2014-08-01 DIAGNOSIS — R079 Chest pain, unspecified: Secondary | ICD-10-CM | POA: Diagnosis not present

## 2014-08-01 DIAGNOSIS — J45909 Unspecified asthma, uncomplicated: Secondary | ICD-10-CM | POA: Diagnosis not present

## 2014-08-01 DIAGNOSIS — R05 Cough: Secondary | ICD-10-CM | POA: Diagnosis not present

## 2014-08-11 DIAGNOSIS — R9431 Abnormal electrocardiogram [ECG] [EKG]: Secondary | ICD-10-CM | POA: Diagnosis not present

## 2014-08-11 DIAGNOSIS — R0602 Shortness of breath: Secondary | ICD-10-CM | POA: Diagnosis not present

## 2014-08-11 DIAGNOSIS — R079 Chest pain, unspecified: Secondary | ICD-10-CM | POA: Diagnosis not present

## 2014-08-25 DIAGNOSIS — J4550 Severe persistent asthma, uncomplicated: Secondary | ICD-10-CM | POA: Diagnosis not present

## 2014-08-25 DIAGNOSIS — R0602 Shortness of breath: Secondary | ICD-10-CM | POA: Diagnosis not present

## 2014-08-25 DIAGNOSIS — I480 Paroxysmal atrial fibrillation: Secondary | ICD-10-CM | POA: Diagnosis not present

## 2014-08-25 DIAGNOSIS — R079 Chest pain, unspecified: Secondary | ICD-10-CM | POA: Diagnosis not present

## 2014-08-25 DIAGNOSIS — R9431 Abnormal electrocardiogram [ECG] [EKG]: Secondary | ICD-10-CM | POA: Diagnosis not present

## 2014-08-25 DIAGNOSIS — I4891 Unspecified atrial fibrillation: Secondary | ICD-10-CM | POA: Diagnosis not present

## 2014-09-28 DIAGNOSIS — J4550 Severe persistent asthma, uncomplicated: Secondary | ICD-10-CM | POA: Diagnosis not present

## 2014-09-29 DIAGNOSIS — I480 Paroxysmal atrial fibrillation: Secondary | ICD-10-CM | POA: Diagnosis not present

## 2014-09-29 DIAGNOSIS — I10 Essential (primary) hypertension: Secondary | ICD-10-CM | POA: Diagnosis not present

## 2014-10-12 DIAGNOSIS — M9902 Segmental and somatic dysfunction of thoracic region: Secondary | ICD-10-CM | POA: Diagnosis not present

## 2014-10-12 DIAGNOSIS — M47816 Spondylosis without myelopathy or radiculopathy, lumbar region: Secondary | ICD-10-CM | POA: Diagnosis not present

## 2014-10-12 DIAGNOSIS — M47812 Spondylosis without myelopathy or radiculopathy, cervical region: Secondary | ICD-10-CM | POA: Diagnosis not present

## 2014-10-12 DIAGNOSIS — M9903 Segmental and somatic dysfunction of lumbar region: Secondary | ICD-10-CM | POA: Diagnosis not present

## 2014-10-12 DIAGNOSIS — M9901 Segmental and somatic dysfunction of cervical region: Secondary | ICD-10-CM | POA: Diagnosis not present

## 2014-10-12 DIAGNOSIS — M4004 Postural kyphosis, thoracic region: Secondary | ICD-10-CM | POA: Diagnosis not present

## 2014-10-13 DIAGNOSIS — M9901 Segmental and somatic dysfunction of cervical region: Secondary | ICD-10-CM | POA: Diagnosis not present

## 2014-10-13 DIAGNOSIS — M47816 Spondylosis without myelopathy or radiculopathy, lumbar region: Secondary | ICD-10-CM | POA: Diagnosis not present

## 2014-10-13 DIAGNOSIS — M5441 Lumbago with sciatica, right side: Secondary | ICD-10-CM | POA: Diagnosis not present

## 2014-10-13 DIAGNOSIS — M47812 Spondylosis without myelopathy or radiculopathy, cervical region: Secondary | ICD-10-CM | POA: Diagnosis not present

## 2014-10-13 DIAGNOSIS — M9903 Segmental and somatic dysfunction of lumbar region: Secondary | ICD-10-CM | POA: Diagnosis not present

## 2014-10-13 DIAGNOSIS — M9902 Segmental and somatic dysfunction of thoracic region: Secondary | ICD-10-CM | POA: Diagnosis not present

## 2014-10-13 DIAGNOSIS — M4004 Postural kyphosis, thoracic region: Secondary | ICD-10-CM | POA: Diagnosis not present

## 2014-10-14 DIAGNOSIS — M9903 Segmental and somatic dysfunction of lumbar region: Secondary | ICD-10-CM | POA: Diagnosis not present

## 2014-10-14 DIAGNOSIS — M5441 Lumbago with sciatica, right side: Secondary | ICD-10-CM | POA: Diagnosis not present

## 2014-10-14 DIAGNOSIS — M9901 Segmental and somatic dysfunction of cervical region: Secondary | ICD-10-CM | POA: Diagnosis not present

## 2014-10-14 DIAGNOSIS — M47816 Spondylosis without myelopathy or radiculopathy, lumbar region: Secondary | ICD-10-CM | POA: Diagnosis not present

## 2014-10-14 DIAGNOSIS — M47812 Spondylosis without myelopathy or radiculopathy, cervical region: Secondary | ICD-10-CM | POA: Diagnosis not present

## 2014-10-14 DIAGNOSIS — M9902 Segmental and somatic dysfunction of thoracic region: Secondary | ICD-10-CM | POA: Diagnosis not present

## 2014-10-14 DIAGNOSIS — M4004 Postural kyphosis, thoracic region: Secondary | ICD-10-CM | POA: Diagnosis not present

## 2014-10-17 DIAGNOSIS — M9902 Segmental and somatic dysfunction of thoracic region: Secondary | ICD-10-CM | POA: Diagnosis not present

## 2014-10-17 DIAGNOSIS — M47812 Spondylosis without myelopathy or radiculopathy, cervical region: Secondary | ICD-10-CM | POA: Diagnosis not present

## 2014-10-17 DIAGNOSIS — M25561 Pain in right knee: Secondary | ICD-10-CM | POA: Diagnosis not present

## 2014-10-17 DIAGNOSIS — M5441 Lumbago with sciatica, right side: Secondary | ICD-10-CM | POA: Diagnosis not present

## 2014-10-17 DIAGNOSIS — M47816 Spondylosis without myelopathy or radiculopathy, lumbar region: Secondary | ICD-10-CM | POA: Diagnosis not present

## 2014-10-17 DIAGNOSIS — M4004 Postural kyphosis, thoracic region: Secondary | ICD-10-CM | POA: Diagnosis not present

## 2014-10-17 DIAGNOSIS — Z981 Arthrodesis status: Secondary | ICD-10-CM | POA: Diagnosis not present

## 2014-10-17 DIAGNOSIS — M9901 Segmental and somatic dysfunction of cervical region: Secondary | ICD-10-CM | POA: Diagnosis not present

## 2014-10-17 DIAGNOSIS — M9903 Segmental and somatic dysfunction of lumbar region: Secondary | ICD-10-CM | POA: Diagnosis not present

## 2014-10-17 DIAGNOSIS — M1611 Unilateral primary osteoarthritis, right hip: Secondary | ICD-10-CM | POA: Diagnosis not present

## 2014-10-18 DIAGNOSIS — M9902 Segmental and somatic dysfunction of thoracic region: Secondary | ICD-10-CM | POA: Diagnosis not present

## 2014-10-18 DIAGNOSIS — M47816 Spondylosis without myelopathy or radiculopathy, lumbar region: Secondary | ICD-10-CM | POA: Diagnosis not present

## 2014-10-18 DIAGNOSIS — M47812 Spondylosis without myelopathy or radiculopathy, cervical region: Secondary | ICD-10-CM | POA: Diagnosis not present

## 2014-10-18 DIAGNOSIS — M9903 Segmental and somatic dysfunction of lumbar region: Secondary | ICD-10-CM | POA: Diagnosis not present

## 2014-10-18 DIAGNOSIS — M9901 Segmental and somatic dysfunction of cervical region: Secondary | ICD-10-CM | POA: Diagnosis not present

## 2014-10-18 DIAGNOSIS — M5441 Lumbago with sciatica, right side: Secondary | ICD-10-CM | POA: Diagnosis not present

## 2014-10-18 DIAGNOSIS — M4004 Postural kyphosis, thoracic region: Secondary | ICD-10-CM | POA: Diagnosis not present

## 2014-10-19 DIAGNOSIS — M47816 Spondylosis without myelopathy or radiculopathy, lumbar region: Secondary | ICD-10-CM | POA: Diagnosis not present

## 2014-10-19 DIAGNOSIS — M9901 Segmental and somatic dysfunction of cervical region: Secondary | ICD-10-CM | POA: Diagnosis not present

## 2014-10-19 DIAGNOSIS — M9903 Segmental and somatic dysfunction of lumbar region: Secondary | ICD-10-CM | POA: Diagnosis not present

## 2014-10-19 DIAGNOSIS — M5441 Lumbago with sciatica, right side: Secondary | ICD-10-CM | POA: Diagnosis not present

## 2014-10-19 DIAGNOSIS — M47812 Spondylosis without myelopathy or radiculopathy, cervical region: Secondary | ICD-10-CM | POA: Diagnosis not present

## 2014-10-19 DIAGNOSIS — M9902 Segmental and somatic dysfunction of thoracic region: Secondary | ICD-10-CM | POA: Diagnosis not present

## 2014-10-19 DIAGNOSIS — M4004 Postural kyphosis, thoracic region: Secondary | ICD-10-CM | POA: Diagnosis not present

## 2014-10-20 DIAGNOSIS — S336XXA Sprain of sacroiliac joint, initial encounter: Secondary | ICD-10-CM | POA: Diagnosis not present

## 2014-10-20 DIAGNOSIS — M9901 Segmental and somatic dysfunction of cervical region: Secondary | ICD-10-CM | POA: Diagnosis not present

## 2014-10-20 DIAGNOSIS — M9902 Segmental and somatic dysfunction of thoracic region: Secondary | ICD-10-CM | POA: Diagnosis not present

## 2014-10-20 DIAGNOSIS — M47816 Spondylosis without myelopathy or radiculopathy, lumbar region: Secondary | ICD-10-CM | POA: Diagnosis not present

## 2014-10-20 DIAGNOSIS — M9903 Segmental and somatic dysfunction of lumbar region: Secondary | ICD-10-CM | POA: Diagnosis not present

## 2014-10-20 DIAGNOSIS — M5441 Lumbago with sciatica, right side: Secondary | ICD-10-CM | POA: Diagnosis not present

## 2014-10-20 DIAGNOSIS — M47812 Spondylosis without myelopathy or radiculopathy, cervical region: Secondary | ICD-10-CM | POA: Diagnosis not present

## 2014-10-20 DIAGNOSIS — M4004 Postural kyphosis, thoracic region: Secondary | ICD-10-CM | POA: Diagnosis not present

## 2014-10-21 DIAGNOSIS — M9903 Segmental and somatic dysfunction of lumbar region: Secondary | ICD-10-CM | POA: Diagnosis not present

## 2014-10-21 DIAGNOSIS — M5441 Lumbago with sciatica, right side: Secondary | ICD-10-CM | POA: Diagnosis not present

## 2014-10-21 DIAGNOSIS — M47816 Spondylosis without myelopathy or radiculopathy, lumbar region: Secondary | ICD-10-CM | POA: Diagnosis not present

## 2014-10-21 DIAGNOSIS — M9902 Segmental and somatic dysfunction of thoracic region: Secondary | ICD-10-CM | POA: Diagnosis not present

## 2014-10-21 DIAGNOSIS — M47812 Spondylosis without myelopathy or radiculopathy, cervical region: Secondary | ICD-10-CM | POA: Diagnosis not present

## 2014-10-21 DIAGNOSIS — M9901 Segmental and somatic dysfunction of cervical region: Secondary | ICD-10-CM | POA: Diagnosis not present

## 2014-10-21 DIAGNOSIS — M4004 Postural kyphosis, thoracic region: Secondary | ICD-10-CM | POA: Diagnosis not present

## 2014-10-21 DIAGNOSIS — S336XXA Sprain of sacroiliac joint, initial encounter: Secondary | ICD-10-CM | POA: Diagnosis not present

## 2014-10-24 DIAGNOSIS — M9902 Segmental and somatic dysfunction of thoracic region: Secondary | ICD-10-CM | POA: Diagnosis not present

## 2014-10-24 DIAGNOSIS — S336XXA Sprain of sacroiliac joint, initial encounter: Secondary | ICD-10-CM | POA: Diagnosis not present

## 2014-10-24 DIAGNOSIS — M9903 Segmental and somatic dysfunction of lumbar region: Secondary | ICD-10-CM | POA: Diagnosis not present

## 2014-10-24 DIAGNOSIS — M47812 Spondylosis without myelopathy or radiculopathy, cervical region: Secondary | ICD-10-CM | POA: Diagnosis not present

## 2014-10-24 DIAGNOSIS — M4004 Postural kyphosis, thoracic region: Secondary | ICD-10-CM | POA: Diagnosis not present

## 2014-10-24 DIAGNOSIS — M47816 Spondylosis without myelopathy or radiculopathy, lumbar region: Secondary | ICD-10-CM | POA: Diagnosis not present

## 2014-10-24 DIAGNOSIS — M9901 Segmental and somatic dysfunction of cervical region: Secondary | ICD-10-CM | POA: Diagnosis not present

## 2014-10-24 DIAGNOSIS — M5441 Lumbago with sciatica, right side: Secondary | ICD-10-CM | POA: Diagnosis not present

## 2014-10-25 DIAGNOSIS — J4550 Severe persistent asthma, uncomplicated: Secondary | ICD-10-CM | POA: Diagnosis not present

## 2014-10-26 DIAGNOSIS — M9902 Segmental and somatic dysfunction of thoracic region: Secondary | ICD-10-CM | POA: Diagnosis not present

## 2014-10-26 DIAGNOSIS — M47816 Spondylosis without myelopathy or radiculopathy, lumbar region: Secondary | ICD-10-CM | POA: Diagnosis not present

## 2014-10-26 DIAGNOSIS — S336XXA Sprain of sacroiliac joint, initial encounter: Secondary | ICD-10-CM | POA: Diagnosis not present

## 2014-10-26 DIAGNOSIS — M9903 Segmental and somatic dysfunction of lumbar region: Secondary | ICD-10-CM | POA: Diagnosis not present

## 2014-10-26 DIAGNOSIS — M47812 Spondylosis without myelopathy or radiculopathy, cervical region: Secondary | ICD-10-CM | POA: Diagnosis not present

## 2014-10-26 DIAGNOSIS — M5441 Lumbago with sciatica, right side: Secondary | ICD-10-CM | POA: Diagnosis not present

## 2014-10-26 DIAGNOSIS — M9901 Segmental and somatic dysfunction of cervical region: Secondary | ICD-10-CM | POA: Diagnosis not present

## 2014-10-26 DIAGNOSIS — M4004 Postural kyphosis, thoracic region: Secondary | ICD-10-CM | POA: Diagnosis not present

## 2014-10-31 ENCOUNTER — Encounter (INDEPENDENT_AMBULATORY_CARE_PROVIDER_SITE_OTHER): Payer: Self-pay | Admitting: *Deleted

## 2014-11-01 DIAGNOSIS — M9901 Segmental and somatic dysfunction of cervical region: Secondary | ICD-10-CM | POA: Diagnosis not present

## 2014-11-01 DIAGNOSIS — M47812 Spondylosis without myelopathy or radiculopathy, cervical region: Secondary | ICD-10-CM | POA: Diagnosis not present

## 2014-11-01 DIAGNOSIS — M4004 Postural kyphosis, thoracic region: Secondary | ICD-10-CM | POA: Diagnosis not present

## 2014-11-01 DIAGNOSIS — M9903 Segmental and somatic dysfunction of lumbar region: Secondary | ICD-10-CM | POA: Diagnosis not present

## 2014-11-01 DIAGNOSIS — S336XXA Sprain of sacroiliac joint, initial encounter: Secondary | ICD-10-CM | POA: Diagnosis not present

## 2014-11-01 DIAGNOSIS — M47816 Spondylosis without myelopathy or radiculopathy, lumbar region: Secondary | ICD-10-CM | POA: Diagnosis not present

## 2014-11-01 DIAGNOSIS — M5441 Lumbago with sciatica, right side: Secondary | ICD-10-CM | POA: Diagnosis not present

## 2014-11-01 DIAGNOSIS — M9902 Segmental and somatic dysfunction of thoracic region: Secondary | ICD-10-CM | POA: Diagnosis not present

## 2014-11-04 DIAGNOSIS — M9903 Segmental and somatic dysfunction of lumbar region: Secondary | ICD-10-CM | POA: Diagnosis not present

## 2014-11-04 DIAGNOSIS — S336XXA Sprain of sacroiliac joint, initial encounter: Secondary | ICD-10-CM | POA: Diagnosis not present

## 2014-11-04 DIAGNOSIS — M9902 Segmental and somatic dysfunction of thoracic region: Secondary | ICD-10-CM | POA: Diagnosis not present

## 2014-11-04 DIAGNOSIS — M47816 Spondylosis without myelopathy or radiculopathy, lumbar region: Secondary | ICD-10-CM | POA: Diagnosis not present

## 2014-11-04 DIAGNOSIS — M9901 Segmental and somatic dysfunction of cervical region: Secondary | ICD-10-CM | POA: Diagnosis not present

## 2014-11-04 DIAGNOSIS — M5441 Lumbago with sciatica, right side: Secondary | ICD-10-CM | POA: Diagnosis not present

## 2014-11-04 DIAGNOSIS — M4004 Postural kyphosis, thoracic region: Secondary | ICD-10-CM | POA: Diagnosis not present

## 2014-11-04 DIAGNOSIS — M47812 Spondylosis without myelopathy or radiculopathy, cervical region: Secondary | ICD-10-CM | POA: Diagnosis not present

## 2014-11-07 DIAGNOSIS — M9903 Segmental and somatic dysfunction of lumbar region: Secondary | ICD-10-CM | POA: Diagnosis not present

## 2014-11-07 DIAGNOSIS — M47816 Spondylosis without myelopathy or radiculopathy, lumbar region: Secondary | ICD-10-CM | POA: Diagnosis not present

## 2014-11-07 DIAGNOSIS — M47812 Spondylosis without myelopathy or radiculopathy, cervical region: Secondary | ICD-10-CM | POA: Diagnosis not present

## 2014-11-07 DIAGNOSIS — M9901 Segmental and somatic dysfunction of cervical region: Secondary | ICD-10-CM | POA: Diagnosis not present

## 2014-11-07 DIAGNOSIS — R42 Dizziness and giddiness: Secondary | ICD-10-CM | POA: Diagnosis not present

## 2014-11-07 DIAGNOSIS — M546 Pain in thoracic spine: Secondary | ICD-10-CM | POA: Diagnosis not present

## 2014-11-07 DIAGNOSIS — M5441 Lumbago with sciatica, right side: Secondary | ICD-10-CM | POA: Diagnosis not present

## 2014-11-07 DIAGNOSIS — M4004 Postural kyphosis, thoracic region: Secondary | ICD-10-CM | POA: Diagnosis not present

## 2014-11-07 DIAGNOSIS — M9902 Segmental and somatic dysfunction of thoracic region: Secondary | ICD-10-CM | POA: Diagnosis not present

## 2014-11-07 DIAGNOSIS — S336XXA Sprain of sacroiliac joint, initial encounter: Secondary | ICD-10-CM | POA: Diagnosis not present

## 2014-11-10 DIAGNOSIS — M47816 Spondylosis without myelopathy or radiculopathy, lumbar region: Secondary | ICD-10-CM | POA: Diagnosis not present

## 2014-11-10 DIAGNOSIS — M5441 Lumbago with sciatica, right side: Secondary | ICD-10-CM | POA: Diagnosis not present

## 2014-11-10 DIAGNOSIS — S336XXA Sprain of sacroiliac joint, initial encounter: Secondary | ICD-10-CM | POA: Diagnosis not present

## 2014-11-10 DIAGNOSIS — R42 Dizziness and giddiness: Secondary | ICD-10-CM | POA: Diagnosis not present

## 2014-11-10 DIAGNOSIS — M47812 Spondylosis without myelopathy or radiculopathy, cervical region: Secondary | ICD-10-CM | POA: Diagnosis not present

## 2014-11-10 DIAGNOSIS — M9903 Segmental and somatic dysfunction of lumbar region: Secondary | ICD-10-CM | POA: Diagnosis not present

## 2014-11-10 DIAGNOSIS — M4004 Postural kyphosis, thoracic region: Secondary | ICD-10-CM | POA: Diagnosis not present

## 2014-11-10 DIAGNOSIS — M9902 Segmental and somatic dysfunction of thoracic region: Secondary | ICD-10-CM | POA: Diagnosis not present

## 2014-11-10 DIAGNOSIS — M9901 Segmental and somatic dysfunction of cervical region: Secondary | ICD-10-CM | POA: Diagnosis not present

## 2014-11-10 DIAGNOSIS — M546 Pain in thoracic spine: Secondary | ICD-10-CM | POA: Diagnosis not present

## 2014-11-14 DIAGNOSIS — M9903 Segmental and somatic dysfunction of lumbar region: Secondary | ICD-10-CM | POA: Diagnosis not present

## 2014-11-14 DIAGNOSIS — M4004 Postural kyphosis, thoracic region: Secondary | ICD-10-CM | POA: Diagnosis not present

## 2014-11-14 DIAGNOSIS — M5441 Lumbago with sciatica, right side: Secondary | ICD-10-CM | POA: Diagnosis not present

## 2014-11-14 DIAGNOSIS — M9901 Segmental and somatic dysfunction of cervical region: Secondary | ICD-10-CM | POA: Diagnosis not present

## 2014-11-14 DIAGNOSIS — M47812 Spondylosis without myelopathy or radiculopathy, cervical region: Secondary | ICD-10-CM | POA: Diagnosis not present

## 2014-11-14 DIAGNOSIS — M546 Pain in thoracic spine: Secondary | ICD-10-CM | POA: Diagnosis not present

## 2014-11-14 DIAGNOSIS — S336XXA Sprain of sacroiliac joint, initial encounter: Secondary | ICD-10-CM | POA: Diagnosis not present

## 2014-11-14 DIAGNOSIS — M9902 Segmental and somatic dysfunction of thoracic region: Secondary | ICD-10-CM | POA: Diagnosis not present

## 2014-11-14 DIAGNOSIS — M47816 Spondylosis without myelopathy or radiculopathy, lumbar region: Secondary | ICD-10-CM | POA: Diagnosis not present

## 2014-11-14 DIAGNOSIS — R42 Dizziness and giddiness: Secondary | ICD-10-CM | POA: Diagnosis not present

## 2014-11-21 DIAGNOSIS — M9901 Segmental and somatic dysfunction of cervical region: Secondary | ICD-10-CM | POA: Diagnosis not present

## 2014-11-21 DIAGNOSIS — M9902 Segmental and somatic dysfunction of thoracic region: Secondary | ICD-10-CM | POA: Diagnosis not present

## 2014-11-21 DIAGNOSIS — M546 Pain in thoracic spine: Secondary | ICD-10-CM | POA: Diagnosis not present

## 2014-11-21 DIAGNOSIS — M9903 Segmental and somatic dysfunction of lumbar region: Secondary | ICD-10-CM | POA: Diagnosis not present

## 2014-11-21 DIAGNOSIS — M5441 Lumbago with sciatica, right side: Secondary | ICD-10-CM | POA: Diagnosis not present

## 2014-11-21 DIAGNOSIS — M47816 Spondylosis without myelopathy or radiculopathy, lumbar region: Secondary | ICD-10-CM | POA: Diagnosis not present

## 2014-11-21 DIAGNOSIS — M47812 Spondylosis without myelopathy or radiculopathy, cervical region: Secondary | ICD-10-CM | POA: Diagnosis not present

## 2014-11-21 DIAGNOSIS — R42 Dizziness and giddiness: Secondary | ICD-10-CM | POA: Diagnosis not present

## 2014-11-21 DIAGNOSIS — M4004 Postural kyphosis, thoracic region: Secondary | ICD-10-CM | POA: Diagnosis not present

## 2014-11-21 DIAGNOSIS — S336XXA Sprain of sacroiliac joint, initial encounter: Secondary | ICD-10-CM | POA: Diagnosis not present

## 2014-11-23 ENCOUNTER — Other Ambulatory Visit (INDEPENDENT_AMBULATORY_CARE_PROVIDER_SITE_OTHER): Payer: Self-pay | Admitting: *Deleted

## 2014-11-23 DIAGNOSIS — Z86010 Personal history of colonic polyps: Secondary | ICD-10-CM

## 2014-11-23 DIAGNOSIS — J4550 Severe persistent asthma, uncomplicated: Secondary | ICD-10-CM | POA: Diagnosis not present

## 2014-11-28 DIAGNOSIS — M9901 Segmental and somatic dysfunction of cervical region: Secondary | ICD-10-CM | POA: Diagnosis not present

## 2014-11-28 DIAGNOSIS — M47816 Spondylosis without myelopathy or radiculopathy, lumbar region: Secondary | ICD-10-CM | POA: Diagnosis not present

## 2014-11-28 DIAGNOSIS — M9903 Segmental and somatic dysfunction of lumbar region: Secondary | ICD-10-CM | POA: Diagnosis not present

## 2014-11-28 DIAGNOSIS — R42 Dizziness and giddiness: Secondary | ICD-10-CM | POA: Diagnosis not present

## 2014-11-28 DIAGNOSIS — M546 Pain in thoracic spine: Secondary | ICD-10-CM | POA: Diagnosis not present

## 2014-11-28 DIAGNOSIS — M9902 Segmental and somatic dysfunction of thoracic region: Secondary | ICD-10-CM | POA: Diagnosis not present

## 2014-11-28 DIAGNOSIS — M5441 Lumbago with sciatica, right side: Secondary | ICD-10-CM | POA: Diagnosis not present

## 2014-11-28 DIAGNOSIS — S336XXA Sprain of sacroiliac joint, initial encounter: Secondary | ICD-10-CM | POA: Diagnosis not present

## 2014-11-28 DIAGNOSIS — M47812 Spondylosis without myelopathy or radiculopathy, cervical region: Secondary | ICD-10-CM | POA: Diagnosis not present

## 2014-11-28 DIAGNOSIS — M4004 Postural kyphosis, thoracic region: Secondary | ICD-10-CM | POA: Diagnosis not present

## 2014-12-02 ENCOUNTER — Telehealth (INDEPENDENT_AMBULATORY_CARE_PROVIDER_SITE_OTHER): Payer: Self-pay | Admitting: *Deleted

## 2014-12-02 DIAGNOSIS — Z1211 Encounter for screening for malignant neoplasm of colon: Secondary | ICD-10-CM

## 2014-12-02 MED ORDER — PEG 3350-KCL-NA BICARB-NACL 420 G PO SOLR: 4000.0000 mL | Freq: Once | ORAL | Status: DC

## 2014-12-02 NOTE — Telephone Encounter (Signed)
Patient needs trilyte 

## 2014-12-05 DIAGNOSIS — M9902 Segmental and somatic dysfunction of thoracic region: Secondary | ICD-10-CM | POA: Diagnosis not present

## 2014-12-05 DIAGNOSIS — S336XXA Sprain of sacroiliac joint, initial encounter: Secondary | ICD-10-CM | POA: Diagnosis not present

## 2014-12-05 DIAGNOSIS — M9903 Segmental and somatic dysfunction of lumbar region: Secondary | ICD-10-CM | POA: Diagnosis not present

## 2014-12-05 DIAGNOSIS — R42 Dizziness and giddiness: Secondary | ICD-10-CM | POA: Diagnosis not present

## 2014-12-05 DIAGNOSIS — M47812 Spondylosis without myelopathy or radiculopathy, cervical region: Secondary | ICD-10-CM | POA: Diagnosis not present

## 2014-12-05 DIAGNOSIS — M9901 Segmental and somatic dysfunction of cervical region: Secondary | ICD-10-CM | POA: Diagnosis not present

## 2014-12-05 DIAGNOSIS — M546 Pain in thoracic spine: Secondary | ICD-10-CM | POA: Diagnosis not present

## 2014-12-05 DIAGNOSIS — M5441 Lumbago with sciatica, right side: Secondary | ICD-10-CM | POA: Diagnosis not present

## 2014-12-05 DIAGNOSIS — M47816 Spondylosis without myelopathy or radiculopathy, lumbar region: Secondary | ICD-10-CM | POA: Diagnosis not present

## 2014-12-05 DIAGNOSIS — M4004 Postural kyphosis, thoracic region: Secondary | ICD-10-CM | POA: Diagnosis not present

## 2014-12-14 DIAGNOSIS — M9901 Segmental and somatic dysfunction of cervical region: Secondary | ICD-10-CM | POA: Diagnosis not present

## 2014-12-14 DIAGNOSIS — M47816 Spondylosis without myelopathy or radiculopathy, lumbar region: Secondary | ICD-10-CM | POA: Diagnosis not present

## 2014-12-14 DIAGNOSIS — M4004 Postural kyphosis, thoracic region: Secondary | ICD-10-CM | POA: Diagnosis not present

## 2014-12-14 DIAGNOSIS — M9902 Segmental and somatic dysfunction of thoracic region: Secondary | ICD-10-CM | POA: Diagnosis not present

## 2014-12-14 DIAGNOSIS — M9903 Segmental and somatic dysfunction of lumbar region: Secondary | ICD-10-CM | POA: Diagnosis not present

## 2014-12-14 DIAGNOSIS — S336XXA Sprain of sacroiliac joint, initial encounter: Secondary | ICD-10-CM | POA: Diagnosis not present

## 2014-12-14 DIAGNOSIS — M546 Pain in thoracic spine: Secondary | ICD-10-CM | POA: Diagnosis not present

## 2014-12-14 DIAGNOSIS — M47812 Spondylosis without myelopathy or radiculopathy, cervical region: Secondary | ICD-10-CM | POA: Diagnosis not present

## 2014-12-14 DIAGNOSIS — R42 Dizziness and giddiness: Secondary | ICD-10-CM | POA: Diagnosis not present

## 2014-12-14 DIAGNOSIS — M5441 Lumbago with sciatica, right side: Secondary | ICD-10-CM | POA: Diagnosis not present

## 2014-12-20 ENCOUNTER — Telehealth (INDEPENDENT_AMBULATORY_CARE_PROVIDER_SITE_OTHER): Payer: Self-pay | Admitting: *Deleted

## 2014-12-20 NOTE — Telephone Encounter (Signed)
Referring MD/PCP: howard   Procedure: tcs  Reason/Indication:  Hx polyps  Has patient had this procedure before?  Yes, 2011 -- scanned  If so, when, by whom and where?    Is there a family history of colon cancer?  no  Who?  What age when diagnosed?    Is patient diabetic?   no      Does patient have prosthetic heart valve?  no  Do you have a pacemaker?  no  Has patient ever had endocarditis? no  Has patient had joint replacement within last 12 months?  no  Does patient tend to be constipated or take laxatives? sometimes  Is patient on Coumadin, Plavix and/or Aspirin? yes  Medications: xarelto 20 mg daily, symbicort 2 uffs bid, singulair 10 mg daily, zopinex prn, xolair injection 1 monthly, vitamins  Allergies: asa, most pain meds, pcn  Medication Adjustment: xarelto 2 days  Procedure date & time: 01/19/15 at 830

## 2014-12-21 DIAGNOSIS — J4550 Severe persistent asthma, uncomplicated: Secondary | ICD-10-CM | POA: Diagnosis not present

## 2014-12-21 DIAGNOSIS — M9901 Segmental and somatic dysfunction of cervical region: Secondary | ICD-10-CM | POA: Diagnosis not present

## 2014-12-21 DIAGNOSIS — M47812 Spondylosis without myelopathy or radiculopathy, cervical region: Secondary | ICD-10-CM | POA: Diagnosis not present

## 2014-12-21 DIAGNOSIS — M4004 Postural kyphosis, thoracic region: Secondary | ICD-10-CM | POA: Diagnosis not present

## 2014-12-21 DIAGNOSIS — M5441 Lumbago with sciatica, right side: Secondary | ICD-10-CM | POA: Diagnosis not present

## 2014-12-21 DIAGNOSIS — M9902 Segmental and somatic dysfunction of thoracic region: Secondary | ICD-10-CM | POA: Diagnosis not present

## 2014-12-21 DIAGNOSIS — M47816 Spondylosis without myelopathy or radiculopathy, lumbar region: Secondary | ICD-10-CM | POA: Diagnosis not present

## 2014-12-21 DIAGNOSIS — M546 Pain in thoracic spine: Secondary | ICD-10-CM | POA: Diagnosis not present

## 2014-12-21 DIAGNOSIS — M9903 Segmental and somatic dysfunction of lumbar region: Secondary | ICD-10-CM | POA: Diagnosis not present

## 2014-12-21 DIAGNOSIS — S336XXA Sprain of sacroiliac joint, initial encounter: Secondary | ICD-10-CM | POA: Diagnosis not present

## 2014-12-21 DIAGNOSIS — R42 Dizziness and giddiness: Secondary | ICD-10-CM | POA: Diagnosis not present

## 2014-12-21 NOTE — Telephone Encounter (Signed)
agree

## 2014-12-28 DIAGNOSIS — H2513 Age-related nuclear cataract, bilateral: Secondary | ICD-10-CM | POA: Diagnosis not present

## 2014-12-28 DIAGNOSIS — I480 Paroxysmal atrial fibrillation: Secondary | ICD-10-CM | POA: Insufficient documentation

## 2015-01-04 DIAGNOSIS — R42 Dizziness and giddiness: Secondary | ICD-10-CM | POA: Diagnosis not present

## 2015-01-04 DIAGNOSIS — M47812 Spondylosis without myelopathy or radiculopathy, cervical region: Secondary | ICD-10-CM | POA: Diagnosis not present

## 2015-01-04 DIAGNOSIS — M47816 Spondylosis without myelopathy or radiculopathy, lumbar region: Secondary | ICD-10-CM | POA: Diagnosis not present

## 2015-01-04 DIAGNOSIS — S336XXA Sprain of sacroiliac joint, initial encounter: Secondary | ICD-10-CM | POA: Diagnosis not present

## 2015-01-04 DIAGNOSIS — M546 Pain in thoracic spine: Secondary | ICD-10-CM | POA: Diagnosis not present

## 2015-01-04 DIAGNOSIS — M4004 Postural kyphosis, thoracic region: Secondary | ICD-10-CM | POA: Diagnosis not present

## 2015-01-04 DIAGNOSIS — M9901 Segmental and somatic dysfunction of cervical region: Secondary | ICD-10-CM | POA: Diagnosis not present

## 2015-01-04 DIAGNOSIS — M5441 Lumbago with sciatica, right side: Secondary | ICD-10-CM | POA: Diagnosis not present

## 2015-01-04 DIAGNOSIS — M9903 Segmental and somatic dysfunction of lumbar region: Secondary | ICD-10-CM | POA: Diagnosis not present

## 2015-01-04 DIAGNOSIS — M9902 Segmental and somatic dysfunction of thoracic region: Secondary | ICD-10-CM | POA: Diagnosis not present

## 2015-01-18 DIAGNOSIS — R42 Dizziness and giddiness: Secondary | ICD-10-CM | POA: Diagnosis not present

## 2015-01-18 DIAGNOSIS — S336XXA Sprain of sacroiliac joint, initial encounter: Secondary | ICD-10-CM | POA: Diagnosis not present

## 2015-01-18 DIAGNOSIS — J4550 Severe persistent asthma, uncomplicated: Secondary | ICD-10-CM | POA: Diagnosis not present

## 2015-01-18 DIAGNOSIS — M47816 Spondylosis without myelopathy or radiculopathy, lumbar region: Secondary | ICD-10-CM | POA: Diagnosis not present

## 2015-01-18 DIAGNOSIS — M9903 Segmental and somatic dysfunction of lumbar region: Secondary | ICD-10-CM | POA: Diagnosis not present

## 2015-01-18 DIAGNOSIS — M9902 Segmental and somatic dysfunction of thoracic region: Secondary | ICD-10-CM | POA: Diagnosis not present

## 2015-01-18 DIAGNOSIS — M5441 Lumbago with sciatica, right side: Secondary | ICD-10-CM | POA: Diagnosis not present

## 2015-01-18 DIAGNOSIS — M47812 Spondylosis without myelopathy or radiculopathy, cervical region: Secondary | ICD-10-CM | POA: Diagnosis not present

## 2015-01-18 DIAGNOSIS — M9901 Segmental and somatic dysfunction of cervical region: Secondary | ICD-10-CM | POA: Diagnosis not present

## 2015-01-18 DIAGNOSIS — M546 Pain in thoracic spine: Secondary | ICD-10-CM | POA: Diagnosis not present

## 2015-01-18 DIAGNOSIS — M4004 Postural kyphosis, thoracic region: Secondary | ICD-10-CM | POA: Diagnosis not present

## 2015-01-31 ENCOUNTER — Encounter (INDEPENDENT_AMBULATORY_CARE_PROVIDER_SITE_OTHER): Payer: Self-pay | Admitting: *Deleted

## 2015-02-07 DIAGNOSIS — Z1231 Encounter for screening mammogram for malignant neoplasm of breast: Secondary | ICD-10-CM | POA: Diagnosis not present

## 2015-02-07 DIAGNOSIS — R928 Other abnormal and inconclusive findings on diagnostic imaging of breast: Secondary | ICD-10-CM | POA: Diagnosis not present

## 2015-02-08 DIAGNOSIS — M5136 Other intervertebral disc degeneration, lumbar region: Secondary | ICD-10-CM | POA: Diagnosis not present

## 2015-02-08 DIAGNOSIS — M47812 Spondylosis without myelopathy or radiculopathy, cervical region: Secondary | ICD-10-CM | POA: Diagnosis not present

## 2015-02-08 DIAGNOSIS — M4004 Postural kyphosis, thoracic region: Secondary | ICD-10-CM | POA: Diagnosis not present

## 2015-02-08 DIAGNOSIS — R42 Dizziness and giddiness: Secondary | ICD-10-CM | POA: Diagnosis not present

## 2015-02-08 DIAGNOSIS — M9902 Segmental and somatic dysfunction of thoracic region: Secondary | ICD-10-CM | POA: Diagnosis not present

## 2015-02-08 DIAGNOSIS — M9903 Segmental and somatic dysfunction of lumbar region: Secondary | ICD-10-CM | POA: Diagnosis not present

## 2015-02-08 DIAGNOSIS — M546 Pain in thoracic spine: Secondary | ICD-10-CM | POA: Diagnosis not present

## 2015-02-08 DIAGNOSIS — M9901 Segmental and somatic dysfunction of cervical region: Secondary | ICD-10-CM | POA: Diagnosis not present

## 2015-02-08 DIAGNOSIS — M47816 Spondylosis without myelopathy or radiculopathy, lumbar region: Secondary | ICD-10-CM | POA: Diagnosis not present

## 2015-02-08 DIAGNOSIS — S336XXA Sprain of sacroiliac joint, initial encounter: Secondary | ICD-10-CM | POA: Diagnosis not present

## 2015-02-08 DIAGNOSIS — M5441 Lumbago with sciatica, right side: Secondary | ICD-10-CM | POA: Diagnosis not present

## 2015-02-15 DIAGNOSIS — N6001 Solitary cyst of right breast: Secondary | ICD-10-CM | POA: Diagnosis not present

## 2015-02-15 DIAGNOSIS — J4550 Severe persistent asthma, uncomplicated: Secondary | ICD-10-CM | POA: Diagnosis not present

## 2015-02-15 DIAGNOSIS — R922 Inconclusive mammogram: Secondary | ICD-10-CM | POA: Diagnosis not present

## 2015-02-20 ENCOUNTER — Telehealth (INDEPENDENT_AMBULATORY_CARE_PROVIDER_SITE_OTHER): Payer: Self-pay | Admitting: *Deleted

## 2015-02-20 NOTE — Telephone Encounter (Signed)
Referring MD/PCP: howard   Procedure: tcs  Reason/Indication:  Hx polyps  Has patient had this procedure before?  Yes, 2011 -- scanned  If so, when, by whom and where?    Is there a family history of colon cancer?  no  Who?  What age when diagnosed?    Is patient diabetic?   no      Does patient have prosthetic heart valve?  no  Do you have a pacemaker?  no  Has patient ever had endocarditis? no  Has patient had joint replacement within last 12 months?  no  Does patient tend to be constipated or take laxatives? sometime  Is patient on Coumadin, Plavix and/or Aspirin? yes  Medications: xarelto 20 mg daily, symbicort 2 puffs bid, singulair 10 mg daily, zopinex prn, xolair injection monthly, vitamins  Allergies: asa most pain meds, pcn  Medication Adjustment: xarelto 2 days  Procedure date & time: 03/22/15 at 730

## 2015-02-22 DIAGNOSIS — I480 Paroxysmal atrial fibrillation: Secondary | ICD-10-CM | POA: Diagnosis not present

## 2015-02-22 DIAGNOSIS — T887XXA Unspecified adverse effect of drug or medicament, initial encounter: Secondary | ICD-10-CM | POA: Diagnosis not present

## 2015-02-22 DIAGNOSIS — J309 Allergic rhinitis, unspecified: Secondary | ICD-10-CM | POA: Diagnosis not present

## 2015-02-22 DIAGNOSIS — J4540 Moderate persistent asthma, uncomplicated: Secondary | ICD-10-CM | POA: Diagnosis not present

## 2015-02-22 DIAGNOSIS — Z886 Allergy status to analgesic agent status: Secondary | ICD-10-CM | POA: Diagnosis not present

## 2015-02-22 NOTE — Telephone Encounter (Signed)
agree

## 2015-02-23 DIAGNOSIS — R42 Dizziness and giddiness: Secondary | ICD-10-CM | POA: Diagnosis not present

## 2015-02-23 DIAGNOSIS — M9901 Segmental and somatic dysfunction of cervical region: Secondary | ICD-10-CM | POA: Diagnosis not present

## 2015-02-23 DIAGNOSIS — M47816 Spondylosis without myelopathy or radiculopathy, lumbar region: Secondary | ICD-10-CM | POA: Diagnosis not present

## 2015-02-23 DIAGNOSIS — M9902 Segmental and somatic dysfunction of thoracic region: Secondary | ICD-10-CM | POA: Diagnosis not present

## 2015-02-23 DIAGNOSIS — M9903 Segmental and somatic dysfunction of lumbar region: Secondary | ICD-10-CM | POA: Diagnosis not present

## 2015-02-23 DIAGNOSIS — M5136 Other intervertebral disc degeneration, lumbar region: Secondary | ICD-10-CM | POA: Diagnosis not present

## 2015-02-23 DIAGNOSIS — S336XXA Sprain of sacroiliac joint, initial encounter: Secondary | ICD-10-CM | POA: Diagnosis not present

## 2015-02-23 DIAGNOSIS — M546 Pain in thoracic spine: Secondary | ICD-10-CM | POA: Diagnosis not present

## 2015-02-23 DIAGNOSIS — M4004 Postural kyphosis, thoracic region: Secondary | ICD-10-CM | POA: Diagnosis not present

## 2015-02-23 DIAGNOSIS — M5441 Lumbago with sciatica, right side: Secondary | ICD-10-CM | POA: Diagnosis not present

## 2015-02-23 DIAGNOSIS — M47812 Spondylosis without myelopathy or radiculopathy, cervical region: Secondary | ICD-10-CM | POA: Diagnosis not present

## 2015-03-13 DIAGNOSIS — M5136 Other intervertebral disc degeneration, lumbar region: Secondary | ICD-10-CM | POA: Diagnosis not present

## 2015-03-13 DIAGNOSIS — M9901 Segmental and somatic dysfunction of cervical region: Secondary | ICD-10-CM | POA: Diagnosis not present

## 2015-03-13 DIAGNOSIS — R42 Dizziness and giddiness: Secondary | ICD-10-CM | POA: Diagnosis not present

## 2015-03-13 DIAGNOSIS — M9902 Segmental and somatic dysfunction of thoracic region: Secondary | ICD-10-CM | POA: Diagnosis not present

## 2015-03-13 DIAGNOSIS — S336XXA Sprain of sacroiliac joint, initial encounter: Secondary | ICD-10-CM | POA: Diagnosis not present

## 2015-03-13 DIAGNOSIS — M47816 Spondylosis without myelopathy or radiculopathy, lumbar region: Secondary | ICD-10-CM | POA: Diagnosis not present

## 2015-03-13 DIAGNOSIS — M546 Pain in thoracic spine: Secondary | ICD-10-CM | POA: Diagnosis not present

## 2015-03-13 DIAGNOSIS — M9903 Segmental and somatic dysfunction of lumbar region: Secondary | ICD-10-CM | POA: Diagnosis not present

## 2015-03-13 DIAGNOSIS — M47812 Spondylosis without myelopathy or radiculopathy, cervical region: Secondary | ICD-10-CM | POA: Diagnosis not present

## 2015-03-13 DIAGNOSIS — M5441 Lumbago with sciatica, right side: Secondary | ICD-10-CM | POA: Diagnosis not present

## 2015-03-13 DIAGNOSIS — M4004 Postural kyphosis, thoracic region: Secondary | ICD-10-CM | POA: Diagnosis not present

## 2015-03-14 DIAGNOSIS — M546 Pain in thoracic spine: Secondary | ICD-10-CM | POA: Diagnosis not present

## 2015-03-14 DIAGNOSIS — M47816 Spondylosis without myelopathy or radiculopathy, lumbar region: Secondary | ICD-10-CM | POA: Diagnosis not present

## 2015-03-14 DIAGNOSIS — M5441 Lumbago with sciatica, right side: Secondary | ICD-10-CM | POA: Diagnosis not present

## 2015-03-14 DIAGNOSIS — R42 Dizziness and giddiness: Secondary | ICD-10-CM | POA: Diagnosis not present

## 2015-03-14 DIAGNOSIS — M47812 Spondylosis without myelopathy or radiculopathy, cervical region: Secondary | ICD-10-CM | POA: Diagnosis not present

## 2015-03-14 DIAGNOSIS — M9901 Segmental and somatic dysfunction of cervical region: Secondary | ICD-10-CM | POA: Diagnosis not present

## 2015-03-14 DIAGNOSIS — M9903 Segmental and somatic dysfunction of lumbar region: Secondary | ICD-10-CM | POA: Diagnosis not present

## 2015-03-14 DIAGNOSIS — M4004 Postural kyphosis, thoracic region: Secondary | ICD-10-CM | POA: Diagnosis not present

## 2015-03-14 DIAGNOSIS — M9902 Segmental and somatic dysfunction of thoracic region: Secondary | ICD-10-CM | POA: Diagnosis not present

## 2015-03-14 DIAGNOSIS — M5136 Other intervertebral disc degeneration, lumbar region: Secondary | ICD-10-CM | POA: Diagnosis not present

## 2015-03-14 DIAGNOSIS — S336XXA Sprain of sacroiliac joint, initial encounter: Secondary | ICD-10-CM | POA: Diagnosis not present

## 2015-03-15 DIAGNOSIS — M9902 Segmental and somatic dysfunction of thoracic region: Secondary | ICD-10-CM | POA: Diagnosis not present

## 2015-03-15 DIAGNOSIS — M5136 Other intervertebral disc degeneration, lumbar region: Secondary | ICD-10-CM | POA: Diagnosis not present

## 2015-03-15 DIAGNOSIS — M9903 Segmental and somatic dysfunction of lumbar region: Secondary | ICD-10-CM | POA: Diagnosis not present

## 2015-03-15 DIAGNOSIS — M5441 Lumbago with sciatica, right side: Secondary | ICD-10-CM | POA: Diagnosis not present

## 2015-03-15 DIAGNOSIS — M47816 Spondylosis without myelopathy or radiculopathy, lumbar region: Secondary | ICD-10-CM | POA: Diagnosis not present

## 2015-03-15 DIAGNOSIS — S336XXA Sprain of sacroiliac joint, initial encounter: Secondary | ICD-10-CM | POA: Diagnosis not present

## 2015-03-15 DIAGNOSIS — M9901 Segmental and somatic dysfunction of cervical region: Secondary | ICD-10-CM | POA: Diagnosis not present

## 2015-03-15 DIAGNOSIS — M47812 Spondylosis without myelopathy or radiculopathy, cervical region: Secondary | ICD-10-CM | POA: Diagnosis not present

## 2015-03-15 DIAGNOSIS — M546 Pain in thoracic spine: Secondary | ICD-10-CM | POA: Diagnosis not present

## 2015-03-15 DIAGNOSIS — R42 Dizziness and giddiness: Secondary | ICD-10-CM | POA: Diagnosis not present

## 2015-03-15 DIAGNOSIS — M4004 Postural kyphosis, thoracic region: Secondary | ICD-10-CM | POA: Diagnosis not present

## 2015-03-17 DIAGNOSIS — M4004 Postural kyphosis, thoracic region: Secondary | ICD-10-CM | POA: Diagnosis not present

## 2015-03-17 DIAGNOSIS — M5136 Other intervertebral disc degeneration, lumbar region: Secondary | ICD-10-CM | POA: Diagnosis not present

## 2015-03-17 DIAGNOSIS — R42 Dizziness and giddiness: Secondary | ICD-10-CM | POA: Diagnosis not present

## 2015-03-17 DIAGNOSIS — M9903 Segmental and somatic dysfunction of lumbar region: Secondary | ICD-10-CM | POA: Diagnosis not present

## 2015-03-17 DIAGNOSIS — M5441 Lumbago with sciatica, right side: Secondary | ICD-10-CM | POA: Diagnosis not present

## 2015-03-17 DIAGNOSIS — M9902 Segmental and somatic dysfunction of thoracic region: Secondary | ICD-10-CM | POA: Diagnosis not present

## 2015-03-17 DIAGNOSIS — S336XXA Sprain of sacroiliac joint, initial encounter: Secondary | ICD-10-CM | POA: Diagnosis not present

## 2015-03-17 DIAGNOSIS — M546 Pain in thoracic spine: Secondary | ICD-10-CM | POA: Diagnosis not present

## 2015-03-17 DIAGNOSIS — M47816 Spondylosis without myelopathy or radiculopathy, lumbar region: Secondary | ICD-10-CM | POA: Diagnosis not present

## 2015-03-17 DIAGNOSIS — M47812 Spondylosis without myelopathy or radiculopathy, cervical region: Secondary | ICD-10-CM | POA: Diagnosis not present

## 2015-03-17 DIAGNOSIS — M9901 Segmental and somatic dysfunction of cervical region: Secondary | ICD-10-CM | POA: Diagnosis not present

## 2015-03-20 DIAGNOSIS — M47816 Spondylosis without myelopathy or radiculopathy, lumbar region: Secondary | ICD-10-CM | POA: Diagnosis not present

## 2015-03-20 DIAGNOSIS — M5441 Lumbago with sciatica, right side: Secondary | ICD-10-CM | POA: Diagnosis not present

## 2015-03-20 DIAGNOSIS — R42 Dizziness and giddiness: Secondary | ICD-10-CM | POA: Diagnosis not present

## 2015-03-20 DIAGNOSIS — M9901 Segmental and somatic dysfunction of cervical region: Secondary | ICD-10-CM | POA: Diagnosis not present

## 2015-03-20 DIAGNOSIS — M9903 Segmental and somatic dysfunction of lumbar region: Secondary | ICD-10-CM | POA: Diagnosis not present

## 2015-03-20 DIAGNOSIS — M5136 Other intervertebral disc degeneration, lumbar region: Secondary | ICD-10-CM | POA: Diagnosis not present

## 2015-03-20 DIAGNOSIS — M4004 Postural kyphosis, thoracic region: Secondary | ICD-10-CM | POA: Diagnosis not present

## 2015-03-20 DIAGNOSIS — M47812 Spondylosis without myelopathy or radiculopathy, cervical region: Secondary | ICD-10-CM | POA: Diagnosis not present

## 2015-03-20 DIAGNOSIS — S336XXA Sprain of sacroiliac joint, initial encounter: Secondary | ICD-10-CM | POA: Diagnosis not present

## 2015-03-20 DIAGNOSIS — M546 Pain in thoracic spine: Secondary | ICD-10-CM | POA: Diagnosis not present

## 2015-03-20 DIAGNOSIS — M9902 Segmental and somatic dysfunction of thoracic region: Secondary | ICD-10-CM | POA: Diagnosis not present

## 2015-03-21 DIAGNOSIS — M170 Bilateral primary osteoarthritis of knee: Secondary | ICD-10-CM | POA: Diagnosis not present

## 2015-03-21 DIAGNOSIS — I10 Essential (primary) hypertension: Secondary | ICD-10-CM | POA: Diagnosis not present

## 2015-03-21 DIAGNOSIS — J4542 Moderate persistent asthma with status asthmaticus: Secondary | ICD-10-CM | POA: Diagnosis not present

## 2015-03-22 ENCOUNTER — Encounter (HOSPITAL_COMMUNITY)
Admission: RE | Disposition: A | Discharge status: Home / Self Care | Payer: Self-pay | Source: Ambulatory Visit | Attending: Internal Medicine

## 2015-03-22 ENCOUNTER — Ambulatory Visit (HOSPITAL_COMMUNITY)
Admission: RE | Admit: 2015-03-22 | Discharge: 2015-03-22 | Disposition: A | Discharge status: Home / Self Care | Payer: Medicare Other | Source: Ambulatory Visit | Attending: Internal Medicine | Admitting: Internal Medicine

## 2015-03-22 DIAGNOSIS — D128 Benign neoplasm of rectum: Secondary | ICD-10-CM | POA: Diagnosis not present

## 2015-03-22 DIAGNOSIS — Z86010 Personal history of colonic polyps: Secondary | ICD-10-CM

## 2015-03-22 DIAGNOSIS — J45909 Unspecified asthma, uncomplicated: Secondary | ICD-10-CM | POA: Insufficient documentation

## 2015-03-22 DIAGNOSIS — Z79899 Other long term (current) drug therapy: Secondary | ICD-10-CM | POA: Insufficient documentation

## 2015-03-22 DIAGNOSIS — K644 Residual hemorrhoidal skin tags: Secondary | ICD-10-CM | POA: Diagnosis not present

## 2015-03-22 DIAGNOSIS — K621 Rectal polyp: Secondary | ICD-10-CM | POA: Diagnosis not present

## 2015-03-22 DIAGNOSIS — Z1211 Encounter for screening for malignant neoplasm of colon: Secondary | ICD-10-CM | POA: Insufficient documentation

## 2015-03-22 HISTORY — PX: COLONOSCOPY: SHX5424

## 2015-03-22 SURGERY — COLONOSCOPY
Anesthesia: Moderate Sedation

## 2015-03-22 MED ORDER — MIDAZOLAM HCL 5 MG/5ML IJ SOLN
INTRAMUSCULAR | Status: DC | PRN
  Administered 2015-03-22: 2 mg via INTRAVENOUS
  Administered 2015-03-22: 3 mg via INTRAVENOUS
  Administered 2015-03-22 (×2): 2 mg via INTRAVENOUS
  Administered 2015-03-22: 1 mg via INTRAVENOUS
  Administered 2015-03-22: 2 mg via INTRAVENOUS

## 2015-03-22 MED ORDER — MEPERIDINE HCL 50 MG/ML IJ SOLN
INTRAMUSCULAR | Status: AC
  Filled 2015-03-22: qty 1

## 2015-03-22 MED ORDER — SODIUM CHLORIDE 0.9 % IV SOLN
INTRAVENOUS | Status: DC
  Administered 2015-03-22: 07:00:00 via INTRAVENOUS

## 2015-03-22 MED ORDER — STERILE WATER FOR IRRIGATION IR SOLN
Status: DC | PRN
  Administered 2015-03-22: 08:00:00

## 2015-03-22 MED ORDER — MIDAZOLAM HCL 5 MG/5ML IJ SOLN
INTRAMUSCULAR | Status: AC
  Filled 2015-03-22: qty 5

## 2015-03-22 MED ORDER — MIDAZOLAM HCL 5 MG/5ML IJ SOLN
INTRAMUSCULAR | Status: AC
  Filled 2015-03-22: qty 10

## 2015-03-22 MED ORDER — MEPERIDINE HCL 50 MG/ML IJ SOLN
INTRAMUSCULAR | Status: DC | PRN
  Administered 2015-03-22 (×2): 25 mg via INTRAVENOUS

## 2015-03-22 NOTE — Discharge Instructions (Signed)
Resume usual medications including Xarelto. Resume usual diet. No driving for 24 hours. Physician will call with biopsy results.  Colonoscopy, Care After These instructions give you information on caring for yourself after your procedure. Your doctor may also give you more specific instructions. Call your doctor if you have any problems or questions after your procedure. HOME CARE  Do not drive for 24 hours.  Do not sign important papers or use machinery for 24 hours.  You may shower.  You may go back to your usual activities, but go slower for the first 24 hours.  Take rest breaks often during the first 24 hours.  Walk around or use warm packs on your belly (abdomen) if you have belly cramping or gas.  Drink enough fluids to keep your pee (urine) clear or pale yellow.  Resume your normal diet. Avoid heavy or fried foods.  Avoid drinking alcohol for 24 hours or as told by your doctor.  Only take medicines as told by your doctor. If a tissue sample (biopsy) was taken during the procedure:   Do not take aspirin or blood thinners for 7 days, or as told by your doctor.  Do not drink alcohol for 7 days, or as told by your doctor.  Eat soft foods for the first 24 hours. GET HELP IF: You still have a small amount of blood in your poop (stool) 2-3 days after the procedure. GET HELP RIGHT AWAY IF:  You have more than a small amount of blood in your poop.  You see clumps of tissue (blood clots) in your poop.  Your belly is puffy (swollen).  You feel sick to your stomach (nauseous) or throw up (vomit).  You have a fever.  You have belly pain that gets worse and medicine does not help. MAKE SURE YOU:  Understand these instructions.  Will watch your condition.  Will get help right away if you are not doing well or get worse. Document Released: 08/24/2010 Document Revised: 07/27/2013 Document Reviewed: 03/29/2013 [facility] Patient Information 2015 [place], [place]. This  information is not intended to replace advice given to you by your health care provider. Make sure you discuss any questions you have with your health care provider.

## 2015-03-22 NOTE — Op Note (Signed)
COLONOSCOPY PROCEDURE REPORT  PATIENT:  Dawn Anderson  MR#:  [id number] Birthdate:  1945-10-06, 69 y.o., female Endoscopist:  Dr. Rogene Houston, MD Referred By:  Dr. Rory Percy, MD  Procedure Date: 03/22/2015  Procedure:   Colonoscopy  Indications:  Patient is 69 year old Caucasian female was history of colonic adenomas and is here for surveillance colonoscopy. Last exam was in April 2011 with removal of small polyp which was hyperplastic but she's had adenomas removed on prior exam.  Informed Consent:  The procedure and risks were reviewed with the patient and informed consent was obtained.  Medications:  Demerol 50 mg IV Versed 12 mg IV  Description of procedure:  After a digital rectal exam was performed, that colonoscope was advanced from the anus through the rectum and colon to the area of the cecum, ileocecal valve and appendiceal orifice. The cecum was deeply intubated. These structures were well-seen and photographed for the record. From the level of the cecum and ileocecal valve, the scope was slowly and cautiously withdrawn. The mucosal surfaces were carefully surveyed utilizing scope tip to flexion to facilitate fold flattening as needed. The scope was pulled down into the rectum where a thorough exam including retroflexion was performed.  Findings:   Prep satisfactory. Normal mucosa of cecum, ascending colon, hepatic flexure, transverse colon, splenic flexure, descending and sigmoid colon. Small rectal polyp was cold snared and Tuesday other polyps or ablated via cold biopsy. These polyps were submitted together. Small hemorrhoids below the dentate line.   Therapeutic/Diagnostic Maneuvers Performed:  See above  Complications:  None  Cecal Withdrawal Time:  11 minutes  Impression:  Examination performed to cecum. Three small polyps removed from rectum and submitted together. One was cold snared and the others ablated with cold biopsy forceps. Small external  hemorrhoids  Recommendations:  Standard instructions given. I will contact patient with biopsy results and further recommendations.  REHMAN,NAJEEB U  03/22/2015 9:44 AM  CC: Dr. Rory Percy, MD & Dr. Rayne Du ref. provider found

## 2015-03-22 NOTE — H&P (Signed)
Dawn Anderson is an 69 y.o. female.   Chief Complaint: Patient's here for colonoscopy. HPI: Patient is 69 year old Caucasian female with history of colonic adenomas and is here for surveillance colonoscopy. She denies abdominal pain change in her bowel habits or rectal bleeding. She is on Xarelto for history of A. fib she developed transiently while doing a stress test. Last colonoscopy was April 2011 with removal of single polyp that was hyperplastic. Family history is negative for CRC.  Past Medical History  Diagnosis Date  . Asthma     Past Surgical History  Procedure Laterality Date  . Abdominal hysterectomy    . Shoulder arthroscopy w/ rotator cuff repair Right   . Back surgery      x2  . Breast surgery Left     lump removed  . Cholecystectomy    . Nasal sinus surgery      Family History  Problem Relation Age of Onset  . Kidney disease Father   . Heart disease Father   . Ehlers-Danlos syndrome Son    Social History:  reports that she has never smoked. She has never used smokeless tobacco. She reports that she drinks alcohol. She reports that she does not use illicit drugs.  Allergies:  Allergies  Allergen Reactions  . Asa [Aspirin]   . Penicillins Nausea Only    Medications Prior to Admission  Medication Sig Dispense Refill  . B Complex Vitamins (VITAMIN-B COMPLEX PO) Take 1 tablet by mouth daily.    . montelukast (SINGULAIR) 10 MG tablet Take 10 mg by mouth at bedtime.     . polyethylene glycol-electrolytes (NULYTELY/GOLYTELY) 420 G solution Take 4,000 mLs by mouth once. 4000 mL 0  . rivaroxaban (XARELTO) 20 MG TABS tablet Take 20 mg by mouth daily with supper.    . SYMBICORT 160-4.5 MCG/ACT inhaler Inhale 2 puffs into the lungs 2 (two) times daily.     Dawn Anderson Right 150 MG injection Inject 150 mg into the skin every 28 (twenty-eight) days.     Dawn Anderson HFA 45 MCG/ACT inhaler Inhale 2 puffs into the lungs 4 (four) times daily.       No results found for this or  any previous visit (from the past 48 hour(s)). No results found.  ROS  Blood pressure 157/74, pulse 82, temperature 99.1 F (37.3 C), temperature source Oral, resp. rate 17, height 5' 8.5" (1.74 m), weight 217 lb (98.431 kg), SpO2 95 %. Physical Exam  Constitutional: She appears well-developed and well-nourished.  HENT:  Mouth/Throat: Oropharynx is clear and moist.  Eyes: Conjunctivae are normal. No scleral icterus.  Neck: No thyromegaly present.  Cardiovascular: Normal rate, regular rhythm and normal heart sounds.   No murmur heard. Respiratory: Effort normal and breath sounds normal.  GI: Soft. She exhibits no distension and no mass. There is no tenderness.  Musculoskeletal: She exhibits no edema.  Lymphadenopathy:    She has no cervical adenopathy.  Neurological: She is alert.  Skin: Skin is warm and dry.     Assessment/Plan History of colonic adenomas. Surveillance colonoscopy.  Jawaan Adachi U 03/22/2015, 7:32 AM

## 2015-03-23 DIAGNOSIS — M9901 Segmental and somatic dysfunction of cervical region: Secondary | ICD-10-CM | POA: Diagnosis not present

## 2015-03-23 DIAGNOSIS — M4004 Postural kyphosis, thoracic region: Secondary | ICD-10-CM | POA: Diagnosis not present

## 2015-03-23 DIAGNOSIS — M5441 Lumbago with sciatica, right side: Secondary | ICD-10-CM | POA: Diagnosis not present

## 2015-03-23 DIAGNOSIS — M9902 Segmental and somatic dysfunction of thoracic region: Secondary | ICD-10-CM | POA: Diagnosis not present

## 2015-03-23 DIAGNOSIS — S336XXA Sprain of sacroiliac joint, initial encounter: Secondary | ICD-10-CM | POA: Diagnosis not present

## 2015-03-23 DIAGNOSIS — M5136 Other intervertebral disc degeneration, lumbar region: Secondary | ICD-10-CM | POA: Diagnosis not present

## 2015-03-23 DIAGNOSIS — M546 Pain in thoracic spine: Secondary | ICD-10-CM | POA: Diagnosis not present

## 2015-03-23 DIAGNOSIS — R42 Dizziness and giddiness: Secondary | ICD-10-CM | POA: Diagnosis not present

## 2015-03-23 DIAGNOSIS — M9903 Segmental and somatic dysfunction of lumbar region: Secondary | ICD-10-CM | POA: Diagnosis not present

## 2015-03-23 DIAGNOSIS — M47816 Spondylosis without myelopathy or radiculopathy, lumbar region: Secondary | ICD-10-CM | POA: Diagnosis not present

## 2015-03-23 DIAGNOSIS — M47812 Spondylosis without myelopathy or radiculopathy, cervical region: Secondary | ICD-10-CM | POA: Diagnosis not present

## 2015-03-24 ENCOUNTER — Encounter (HOSPITAL_COMMUNITY): Payer: Self-pay | Admitting: Internal Medicine

## 2015-04-21 DIAGNOSIS — J4542 Moderate persistent asthma with status asthmaticus: Secondary | ICD-10-CM | POA: Diagnosis not present

## 2015-04-21 DIAGNOSIS — M170 Bilateral primary osteoarthritis of knee: Secondary | ICD-10-CM | POA: Diagnosis not present

## 2015-04-21 DIAGNOSIS — I10 Essential (primary) hypertension: Secondary | ICD-10-CM | POA: Diagnosis not present

## 2015-05-01 DIAGNOSIS — M47812 Spondylosis without myelopathy or radiculopathy, cervical region: Secondary | ICD-10-CM | POA: Diagnosis not present

## 2015-05-01 DIAGNOSIS — R42 Dizziness and giddiness: Secondary | ICD-10-CM | POA: Diagnosis not present

## 2015-05-01 DIAGNOSIS — S336XXA Sprain of sacroiliac joint, initial encounter: Secondary | ICD-10-CM | POA: Diagnosis not present

## 2015-05-01 DIAGNOSIS — M5136 Other intervertebral disc degeneration, lumbar region: Secondary | ICD-10-CM | POA: Diagnosis not present

## 2015-05-01 DIAGNOSIS — M546 Pain in thoracic spine: Secondary | ICD-10-CM | POA: Diagnosis not present

## 2015-05-01 DIAGNOSIS — M5441 Lumbago with sciatica, right side: Secondary | ICD-10-CM | POA: Diagnosis not present

## 2015-05-01 DIAGNOSIS — M9903 Segmental and somatic dysfunction of lumbar region: Secondary | ICD-10-CM | POA: Diagnosis not present

## 2015-05-01 DIAGNOSIS — M4004 Postural kyphosis, thoracic region: Secondary | ICD-10-CM | POA: Diagnosis not present

## 2015-05-01 DIAGNOSIS — M9901 Segmental and somatic dysfunction of cervical region: Secondary | ICD-10-CM | POA: Diagnosis not present

## 2015-05-01 DIAGNOSIS — M47816 Spondylosis without myelopathy or radiculopathy, lumbar region: Secondary | ICD-10-CM | POA: Diagnosis not present

## 2015-05-01 DIAGNOSIS — M9902 Segmental and somatic dysfunction of thoracic region: Secondary | ICD-10-CM | POA: Diagnosis not present

## 2015-05-05 DIAGNOSIS — D239 Other benign neoplasm of skin, unspecified: Secondary | ICD-10-CM | POA: Diagnosis not present

## 2015-05-05 DIAGNOSIS — L570 Actinic keratosis: Secondary | ICD-10-CM | POA: Diagnosis not present

## 2015-05-22 DIAGNOSIS — J4542 Moderate persistent asthma with status asthmaticus: Secondary | ICD-10-CM | POA: Diagnosis not present

## 2015-05-22 DIAGNOSIS — M170 Bilateral primary osteoarthritis of knee: Secondary | ICD-10-CM | POA: Diagnosis not present

## 2015-05-22 DIAGNOSIS — I10 Essential (primary) hypertension: Secondary | ICD-10-CM | POA: Diagnosis not present

## 2015-06-08 DIAGNOSIS — L820 Inflamed seborrheic keratosis: Secondary | ICD-10-CM | POA: Diagnosis not present

## 2015-06-08 DIAGNOSIS — L570 Actinic keratosis: Secondary | ICD-10-CM | POA: Diagnosis not present

## 2015-06-12 DIAGNOSIS — M5441 Lumbago with sciatica, right side: Secondary | ICD-10-CM | POA: Diagnosis not present

## 2015-06-12 DIAGNOSIS — M9902 Segmental and somatic dysfunction of thoracic region: Secondary | ICD-10-CM | POA: Diagnosis not present

## 2015-06-12 DIAGNOSIS — H6692 Otitis media, unspecified, left ear: Secondary | ICD-10-CM | POA: Diagnosis not present

## 2015-06-12 DIAGNOSIS — M5136 Other intervertebral disc degeneration, lumbar region: Secondary | ICD-10-CM | POA: Diagnosis not present

## 2015-06-12 DIAGNOSIS — J0110 Acute frontal sinusitis, unspecified: Secondary | ICD-10-CM | POA: Diagnosis not present

## 2015-06-12 DIAGNOSIS — S336XXA Sprain of sacroiliac joint, initial encounter: Secondary | ICD-10-CM | POA: Diagnosis not present

## 2015-06-12 DIAGNOSIS — M47816 Spondylosis without myelopathy or radiculopathy, lumbar region: Secondary | ICD-10-CM | POA: Diagnosis not present

## 2015-06-12 DIAGNOSIS — R42 Dizziness and giddiness: Secondary | ICD-10-CM | POA: Diagnosis not present

## 2015-06-12 DIAGNOSIS — M47812 Spondylosis without myelopathy or radiculopathy, cervical region: Secondary | ICD-10-CM | POA: Diagnosis not present

## 2015-06-12 DIAGNOSIS — M4004 Postural kyphosis, thoracic region: Secondary | ICD-10-CM | POA: Diagnosis not present

## 2015-06-12 DIAGNOSIS — M546 Pain in thoracic spine: Secondary | ICD-10-CM | POA: Diagnosis not present

## 2015-06-12 DIAGNOSIS — M9903 Segmental and somatic dysfunction of lumbar region: Secondary | ICD-10-CM | POA: Diagnosis not present

## 2015-06-12 DIAGNOSIS — M9901 Segmental and somatic dysfunction of cervical region: Secondary | ICD-10-CM | POA: Diagnosis not present

## 2015-06-22 DIAGNOSIS — M170 Bilateral primary osteoarthritis of knee: Secondary | ICD-10-CM | POA: Diagnosis not present

## 2015-06-22 DIAGNOSIS — Z23 Encounter for immunization: Secondary | ICD-10-CM | POA: Diagnosis not present

## 2015-06-22 DIAGNOSIS — I10 Essential (primary) hypertension: Secondary | ICD-10-CM | POA: Diagnosis not present

## 2015-06-22 DIAGNOSIS — J4542 Moderate persistent asthma with status asthmaticus: Secondary | ICD-10-CM | POA: Diagnosis not present

## 2015-07-11 DIAGNOSIS — J4540 Moderate persistent asthma, uncomplicated: Secondary | ICD-10-CM | POA: Diagnosis not present

## 2015-07-11 DIAGNOSIS — J0110 Acute frontal sinusitis, unspecified: Secondary | ICD-10-CM | POA: Diagnosis not present

## 2015-07-11 DIAGNOSIS — I10 Essential (primary) hypertension: Secondary | ICD-10-CM | POA: Diagnosis not present

## 2015-07-25 DIAGNOSIS — M5441 Lumbago with sciatica, right side: Secondary | ICD-10-CM | POA: Diagnosis not present

## 2015-07-25 DIAGNOSIS — M546 Pain in thoracic spine: Secondary | ICD-10-CM | POA: Diagnosis not present

## 2015-07-25 DIAGNOSIS — M9903 Segmental and somatic dysfunction of lumbar region: Secondary | ICD-10-CM | POA: Diagnosis not present

## 2015-07-25 DIAGNOSIS — M5136 Other intervertebral disc degeneration, lumbar region: Secondary | ICD-10-CM | POA: Diagnosis not present

## 2015-07-25 DIAGNOSIS — M47812 Spondylosis without myelopathy or radiculopathy, cervical region: Secondary | ICD-10-CM | POA: Diagnosis not present

## 2015-07-25 DIAGNOSIS — M9901 Segmental and somatic dysfunction of cervical region: Secondary | ICD-10-CM | POA: Diagnosis not present

## 2015-07-25 DIAGNOSIS — R42 Dizziness and giddiness: Secondary | ICD-10-CM | POA: Diagnosis not present

## 2015-07-25 DIAGNOSIS — S336XXA Sprain of sacroiliac joint, initial encounter: Secondary | ICD-10-CM | POA: Diagnosis not present

## 2015-07-25 DIAGNOSIS — M47816 Spondylosis without myelopathy or radiculopathy, lumbar region: Secondary | ICD-10-CM | POA: Diagnosis not present

## 2015-07-25 DIAGNOSIS — M9902 Segmental and somatic dysfunction of thoracic region: Secondary | ICD-10-CM | POA: Diagnosis not present

## 2015-07-25 DIAGNOSIS — M4004 Postural kyphosis, thoracic region: Secondary | ICD-10-CM | POA: Diagnosis not present

## 2015-07-26 DIAGNOSIS — J4542 Moderate persistent asthma with status asthmaticus: Secondary | ICD-10-CM | POA: Diagnosis not present

## 2015-08-03 DIAGNOSIS — H6532 Chronic mucoid otitis media, left ear: Secondary | ICD-10-CM | POA: Diagnosis not present

## 2015-08-30 DIAGNOSIS — M546 Pain in thoracic spine: Secondary | ICD-10-CM | POA: Diagnosis not present

## 2015-08-30 DIAGNOSIS — M9902 Segmental and somatic dysfunction of thoracic region: Secondary | ICD-10-CM | POA: Diagnosis not present

## 2015-08-30 DIAGNOSIS — S338XXA Sprain of other parts of lumbar spine and pelvis, initial encounter: Secondary | ICD-10-CM | POA: Diagnosis not present

## 2015-08-30 DIAGNOSIS — S134XXA Sprain of ligaments of cervical spine, initial encounter: Secondary | ICD-10-CM | POA: Diagnosis not present

## 2015-08-30 DIAGNOSIS — M9901 Segmental and somatic dysfunction of cervical region: Secondary | ICD-10-CM | POA: Diagnosis not present

## 2015-08-30 DIAGNOSIS — M9903 Segmental and somatic dysfunction of lumbar region: Secondary | ICD-10-CM | POA: Diagnosis not present

## 2015-10-16 DIAGNOSIS — M546 Pain in thoracic spine: Secondary | ICD-10-CM | POA: Diagnosis not present

## 2015-10-16 DIAGNOSIS — M47816 Spondylosis without myelopathy or radiculopathy, lumbar region: Secondary | ICD-10-CM | POA: Diagnosis not present

## 2015-10-16 DIAGNOSIS — M9902 Segmental and somatic dysfunction of thoracic region: Secondary | ICD-10-CM | POA: Diagnosis not present

## 2015-10-16 DIAGNOSIS — M9903 Segmental and somatic dysfunction of lumbar region: Secondary | ICD-10-CM | POA: Diagnosis not present

## 2015-10-16 DIAGNOSIS — S134XXA Sprain of ligaments of cervical spine, initial encounter: Secondary | ICD-10-CM | POA: Diagnosis not present

## 2015-10-16 DIAGNOSIS — M9901 Segmental and somatic dysfunction of cervical region: Secondary | ICD-10-CM | POA: Diagnosis not present

## 2015-10-16 DIAGNOSIS — S338XXA Sprain of other parts of lumbar spine and pelvis, initial encounter: Secondary | ICD-10-CM | POA: Diagnosis not present

## 2015-10-17 DIAGNOSIS — M546 Pain in thoracic spine: Secondary | ICD-10-CM | POA: Diagnosis not present

## 2015-10-17 DIAGNOSIS — M9902 Segmental and somatic dysfunction of thoracic region: Secondary | ICD-10-CM | POA: Diagnosis not present

## 2015-10-17 DIAGNOSIS — M9901 Segmental and somatic dysfunction of cervical region: Secondary | ICD-10-CM | POA: Diagnosis not present

## 2015-10-17 DIAGNOSIS — S338XXA Sprain of other parts of lumbar spine and pelvis, initial encounter: Secondary | ICD-10-CM | POA: Diagnosis not present

## 2015-10-17 DIAGNOSIS — S134XXA Sprain of ligaments of cervical spine, initial encounter: Secondary | ICD-10-CM | POA: Diagnosis not present

## 2015-10-17 DIAGNOSIS — M9903 Segmental and somatic dysfunction of lumbar region: Secondary | ICD-10-CM | POA: Diagnosis not present

## 2015-10-17 DIAGNOSIS — M47816 Spondylosis without myelopathy or radiculopathy, lumbar region: Secondary | ICD-10-CM | POA: Diagnosis not present

## 2015-10-18 DIAGNOSIS — S338XXA Sprain of other parts of lumbar spine and pelvis, initial encounter: Secondary | ICD-10-CM | POA: Diagnosis not present

## 2015-10-18 DIAGNOSIS — M9902 Segmental and somatic dysfunction of thoracic region: Secondary | ICD-10-CM | POA: Diagnosis not present

## 2015-10-18 DIAGNOSIS — S134XXA Sprain of ligaments of cervical spine, initial encounter: Secondary | ICD-10-CM | POA: Diagnosis not present

## 2015-10-18 DIAGNOSIS — M9903 Segmental and somatic dysfunction of lumbar region: Secondary | ICD-10-CM | POA: Diagnosis not present

## 2015-10-18 DIAGNOSIS — M546 Pain in thoracic spine: Secondary | ICD-10-CM | POA: Diagnosis not present

## 2015-10-18 DIAGNOSIS — M9901 Segmental and somatic dysfunction of cervical region: Secondary | ICD-10-CM | POA: Diagnosis not present

## 2015-10-18 DIAGNOSIS — M47816 Spondylosis without myelopathy or radiculopathy, lumbar region: Secondary | ICD-10-CM | POA: Diagnosis not present

## 2015-10-19 DIAGNOSIS — M9902 Segmental and somatic dysfunction of thoracic region: Secondary | ICD-10-CM | POA: Diagnosis not present

## 2015-10-19 DIAGNOSIS — M9903 Segmental and somatic dysfunction of lumbar region: Secondary | ICD-10-CM | POA: Diagnosis not present

## 2015-10-19 DIAGNOSIS — M546 Pain in thoracic spine: Secondary | ICD-10-CM | POA: Diagnosis not present

## 2015-10-19 DIAGNOSIS — S338XXA Sprain of other parts of lumbar spine and pelvis, initial encounter: Secondary | ICD-10-CM | POA: Diagnosis not present

## 2015-10-19 DIAGNOSIS — M9901 Segmental and somatic dysfunction of cervical region: Secondary | ICD-10-CM | POA: Diagnosis not present

## 2015-10-19 DIAGNOSIS — S134XXA Sprain of ligaments of cervical spine, initial encounter: Secondary | ICD-10-CM | POA: Diagnosis not present

## 2015-10-19 DIAGNOSIS — M47816 Spondylosis without myelopathy or radiculopathy, lumbar region: Secondary | ICD-10-CM | POA: Diagnosis not present

## 2015-10-23 DIAGNOSIS — S338XXA Sprain of other parts of lumbar spine and pelvis, initial encounter: Secondary | ICD-10-CM | POA: Diagnosis not present

## 2015-10-23 DIAGNOSIS — S134XXA Sprain of ligaments of cervical spine, initial encounter: Secondary | ICD-10-CM | POA: Diagnosis not present

## 2015-10-23 DIAGNOSIS — M9903 Segmental and somatic dysfunction of lumbar region: Secondary | ICD-10-CM | POA: Diagnosis not present

## 2015-10-23 DIAGNOSIS — M546 Pain in thoracic spine: Secondary | ICD-10-CM | POA: Diagnosis not present

## 2015-10-23 DIAGNOSIS — M9902 Segmental and somatic dysfunction of thoracic region: Secondary | ICD-10-CM | POA: Diagnosis not present

## 2015-10-23 DIAGNOSIS — M9901 Segmental and somatic dysfunction of cervical region: Secondary | ICD-10-CM | POA: Diagnosis not present

## 2015-10-23 DIAGNOSIS — M47816 Spondylosis without myelopathy or radiculopathy, lumbar region: Secondary | ICD-10-CM | POA: Diagnosis not present

## 2015-11-07 DIAGNOSIS — S134XXA Sprain of ligaments of cervical spine, initial encounter: Secondary | ICD-10-CM | POA: Diagnosis not present

## 2015-11-07 DIAGNOSIS — M546 Pain in thoracic spine: Secondary | ICD-10-CM | POA: Diagnosis not present

## 2015-11-07 DIAGNOSIS — M9902 Segmental and somatic dysfunction of thoracic region: Secondary | ICD-10-CM | POA: Diagnosis not present

## 2015-11-07 DIAGNOSIS — M47816 Spondylosis without myelopathy or radiculopathy, lumbar region: Secondary | ICD-10-CM | POA: Diagnosis not present

## 2015-11-07 DIAGNOSIS — M9901 Segmental and somatic dysfunction of cervical region: Secondary | ICD-10-CM | POA: Diagnosis not present

## 2015-11-07 DIAGNOSIS — M9903 Segmental and somatic dysfunction of lumbar region: Secondary | ICD-10-CM | POA: Diagnosis not present

## 2015-11-07 DIAGNOSIS — S338XXA Sprain of other parts of lumbar spine and pelvis, initial encounter: Secondary | ICD-10-CM | POA: Diagnosis not present

## 2015-12-04 ENCOUNTER — Ambulatory Visit (INDEPENDENT_AMBULATORY_CARE_PROVIDER_SITE_OTHER): Payer: PPO | Admitting: Adult Health

## 2015-12-04 ENCOUNTER — Encounter: Payer: Self-pay | Admitting: Adult Health

## 2015-12-04 VITALS — BP 130/70 | HR 86 | Ht 69.0 in

## 2015-12-04 DIAGNOSIS — Z01419 Encounter for gynecological examination (general) (routine) without abnormal findings: Secondary | ICD-10-CM | POA: Diagnosis not present

## 2015-12-04 DIAGNOSIS — Z1211 Encounter for screening for malignant neoplasm of colon: Secondary | ICD-10-CM

## 2015-12-04 LAB — HEMOCCULT GUIAC POC 1CARD (OFFICE): Fecal Occult Blood, POC: NEGATIVE

## 2015-12-04 NOTE — Progress Notes (Signed)
Patient ID: [name], female   DOB: 05-18-1946, 70 y.o.   MRN: [id number] History of Present Illness:  Dawn Anderson is a 70 year old white female in for a well  Woman gyn exam, she is sp hysterectomy. PCP is Dr Nadara Mustard.  Current Medications, Allergies, Past Medical History, Past Surgical History, Family History and Social History were reviewed in [employer] record.     Review of Systems: Patient denies any headaches, hearing loss, fatigue, blurred vision, shortness of breath, chest pain, abdominal pain, problems with bowel movements(occasional constipation), urination, or intercourse.Not having sex. No joint pain or mood swings.    Physical Exam:BP 130/70 mmHg  Pulse 86  Ht 5\' 9"  (1.753 m) General:  Well developed, well nourished, no acute distress Skin:  Warm and dry Neck:  Midline trachea, normal thyroid, good ROM, no lymphadenopathy,no carotid bruits heard Lungs; Clear to auscultation bilaterally Breast:  No dominant palpable mass, retraction, or nipple discharge Cardiovascular: Regular rate and rhythm Abdomen:  Soft, non tender, no hepatosplenomegaly Pelvic:  External genitalia is normal in appearance, no lesions.  The vagina has decreased color, moisture and rugae. Urethra has no lesions or masses.The cervix and uterus are absent.  No adnexal masses or tenderness noted.Bladder is non tender, no masses felt. Rectal: Good sphincter tone, no polyps, or hemorrhoids felt.  Hemoccult negative. Extremities/musculoskeletal:  No swelling or varicosities noted, no clubbing or cyanosis Psych:  No mood changes, alert and cooperative,seems happy   Impression: Well woman gyn exam no pap    Plan: Physical in 2 years Mammogram yearly Labs with PCP

## 2015-12-04 NOTE — Patient Instructions (Signed)
Physical in 2 years Mammogram yearly  Labs with PCP 

## 2015-12-18 DIAGNOSIS — I10 Essential (primary) hypertension: Secondary | ICD-10-CM | POA: Diagnosis not present

## 2015-12-18 DIAGNOSIS — I480 Paroxysmal atrial fibrillation: Secondary | ICD-10-CM | POA: Diagnosis not present

## 2015-12-27 ENCOUNTER — Telehealth: Payer: Self-pay | Admitting: *Deleted

## 2015-12-27 NOTE — Telephone Encounter (Signed)
Pt informed no pap done at her 12/04/2015 annual exam per Derrek Monaco, NP note.

## 2016-05-07 ENCOUNTER — Other Ambulatory Visit: Payer: Self-pay | Admitting: Physician Assistant

## 2016-06-17 ENCOUNTER — Ambulatory Visit: Payer: Medicare Other | Admitting: Cardiovascular Disease

## 2016-06-18 ENCOUNTER — Ambulatory Visit: Payer: Medicare Other | Admitting: Cardiovascular Disease

## 2016-07-12 ENCOUNTER — Ambulatory Visit (INDEPENDENT_AMBULATORY_CARE_PROVIDER_SITE_OTHER): Payer: PPO | Admitting: Cardiovascular Disease

## 2016-07-12 ENCOUNTER — Encounter: Payer: Self-pay | Admitting: Cardiovascular Disease

## 2016-07-12 VITALS — BP 148/70 | HR 91 | Ht 68.5 in | Wt 200.0 lb

## 2016-07-12 DIAGNOSIS — Z5181 Encounter for therapeutic drug level monitoring: Secondary | ICD-10-CM

## 2016-07-12 DIAGNOSIS — I10 Essential (primary) hypertension: Secondary | ICD-10-CM

## 2016-07-12 DIAGNOSIS — Z7901 Long term (current) use of anticoagulants: Secondary | ICD-10-CM | POA: Diagnosis not present

## 2016-07-12 DIAGNOSIS — I480 Paroxysmal atrial fibrillation: Secondary | ICD-10-CM | POA: Diagnosis not present

## 2016-07-12 NOTE — Patient Instructions (Signed)
Your physician wants you to follow-up in: 1 YEAR WITH DR KONESWARAN You will receive a reminder letter in the mail two months in advance. If you don't receive a letter, please call our office to schedule the follow-up appointment.  Your physician recommends that you continue on your current medications as directed. Please refer to the Current Medication list given to you today.  Thank you for choosing [facility]!!    

## 2016-07-12 NOTE — Progress Notes (Signed)
CARDIOLOGY CONSULT NOTE  Patient ID: Dawn Anderson MRN: [id number] DOB/AGE: [birth year] 70 y.o.  Admit date: (Not on file) Primary Physician: Rory Percy, MD Referring Physician:   Reason for Consultation: PAF  HPI: The patient is a 70 year old woman with a history of asthma and paroxysmal atrial fibrillation. She is anticoagulated with Xarelto. She was previously seen by [facility].  She has multiple drug and food allergies. She says oregano is deadly. She can eat up to one strawberry without having difficulties.  She currently denies exertional chest pain. She has chronic exertional dyspnea due to asthma.  She says her blood pressure fluctuates. Yesterday it was 127/70.  ECG performed today shows sinus rhythm with possible old anteroseptal infarct. No known history of prior MI.  Soc: Retired high school world history Pharmacist, hospital. 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.   Allergies  Allergen Reactions  . Apple Anaphylaxis  . Asa [Aspirin] Anaphylaxis  . Coconut Oil Itching  . Origanum Oil Anaphylaxis  . Pineapple Anaphylaxis  . Plastibase Itching  . Prunus Persica Anaphylaxis  . Salicylates Anaphylaxis  . Salmeterol Anaphylaxis  . Strawberry (Diagnostic) Anaphylaxis  . Sulfamethizole Itching  . Chocolate Hives  . Clarithromycin Rash  . Penicillins Nausea Only and Rash    Current Outpatient Prescriptions  Medication Sig Dispense Refill  . B Complex Vitamins (VITAMIN-B COMPLEX PO) Take 1 tablet by mouth daily.    Marland Kitchen MILK THISTLE PO Take by mouth daily.    . montelukast (SINGULAIR) 10 MG tablet Take 10 mg by mouth at bedtime.     . Multiple Vitamin (MULTIVITAMIN) tablet Take 1 tablet by mouth daily.    . rivaroxaban (XARELTO) 20 MG TABS tablet Take 20 mg by mouth daily with supper.    . SYMBICORT 160-4.5 MCG/ACT inhaler Inhale 2 puffs into the lungs 2 (two) times daily.     [name] 150 MG injection Inject 150 mg into the skin every 28  (twenty-eight) days.     [name] HFA 45 MCG/ACT inhaler Inhale 2 puffs into the lungs as needed.      No current facility-administered medications for this visit.     Past Medical History:  Diagnosis Date  . Asthma     Past Surgical History:  Procedure Laterality Date  . ABDOMINAL HYSTERECTOMY    . BACK SURGERY     x2  . BREAST SURGERY Left    lump removed  . CHOLECYSTECTOMY    . COLONOSCOPY N/A 03/22/2015   Procedure: COLONOSCOPY;  Surgeon: Rogene Houston, MD;  Location: [facility] SUITE;  Service: Endoscopy;  Laterality: N/A;  830 - moved to 8/17 @ 7:30 - Ann to notify pt  . NASAL SINUS SURGERY    . SHOULDER ARTHROSCOPY W/ ROTATOR CUFF REPAIR Right     Social History   Social History  . Marital status: Widowed    Spouse name: N/A  . Number of children: N/A  . Years of education: N/A   Occupational History  . Not on file.   Social History Main Topics  . Smoking status: Never Smoker  . Smokeless tobacco: Never Used  . Alcohol use Yes     Comment: occ.   . Drug use: No  . Sexual activity: Not Currently    Birth control/ protection: Surgical     Comment: hyst   Other Topics Concern  . Not on file   Social History Narrative  . No narrative on  file     No family history of premature CAD in 1st degree relatives.  Prior to Admission medications   Medication Sig Start Date End Date Taking? Authorizing Provider  B Complex Vitamins (VITAMIN-B COMPLEX PO) Take 1 tablet by mouth daily.    Historical Provider, MD  GARLIC PO Take by mouth daily.    Historical Provider, MD  MILK THISTLE PO Take by mouth daily.    Historical Provider, MD  montelukast (SINGULAIR) 10 MG tablet Take 10 mg by mouth at bedtime.  11/03/13   Historical Provider, MD  Multiple Vitamin (MULTIVITAMIN) tablet Take 1 tablet by mouth daily.    Historical Provider, MD  rivaroxaban (XARELTO) 20 MG TABS tablet Take 20 mg by mouth daily with supper.    Historical Provider, MD  SYMBICORT 160-4.5 MCG/ACT  inhaler Inhale 2 puffs into the lungs 2 (two) times daily.  11/15/13   Historical Provider, MD  XOLAIR 150 MG injection Inject 150 mg into the skin every 28 (twenty-eight) days.  10/18/13   Historical Provider, MD  XOPENEX HFA 45 MCG/ACT inhaler Inhale 2 puffs into the lungs as needed.  11/15/13   Historical Provider, MD     Review of systems complete and found to be negative unless listed above in HPI     Physical exam Blood pressure (!) 148/70, pulse 91, height 5' 8.5" (1.74 m), weight 200 lb (90.7 kg), SpO2 98 %. General: NAD Neck: No JVD, no thyromegaly or thyroid nodule.  Lungs: No rales/wheezes. CV: Nondisplaced PMI. Regular rate and rhythm, normal S1/S2, no S3/S4, no murmur.  No peripheral edema.  No carotid bruit.  Normal pedal pulses.  Abdomen: Soft, nontender, no hepatosplenomegaly, no distention.  Skin: Intact without lesions or rashes.  Neurologic: Alert and oriented x 3.  Psych: Normal affect. Extremities: No clubbing or cyanosis.  HEENT: Normal.   ECG: Most recent ECG reviewed.  Labs:  No results found for: WBC, HGB, HCT, MCV, PLT No results for input(s): NA, K, CL, CO2, BUN, CREATININE, CALCIUM, PROT, BILITOT, ALKPHOS, ALT, AST, GLUCOSE in the last 168 hours.  Invalid input(s): LABALBU No results found for: CKTOTAL, CKMB, CKMBINDEX, TROPONINI No results found for: CHOL No results found for: HDL No results found for: LDLCALC No results found for: TRIG No results found for: CHOLHDL No results found for: LDLDIRECT       Studies: No results found.  ASSESSMENT AND PLAN:  1. Paroxysmal atrial fibrillation: Continue Xarelto to reduce thromboembolic risk. CHADSVASC at least 2 (age, gender) if not 3 (HTN).  2. Essential HTN: Elevated and not on antihypertensive therapy. BP reportedly fluctuates. Will monitor.  Dispo: fu 1 yr.   Signed: Kate Sable, M.D., F.A.C.C.  07/12/2016, 9:25 AM

## 2016-08-29 ENCOUNTER — Other Ambulatory Visit: Payer: Self-pay | Admitting: Physician Assistant

## 2016-09-05 ENCOUNTER — Other Ambulatory Visit: Payer: Self-pay | Admitting: Otolaryngology

## 2016-09-05 DIAGNOSIS — H9190 Unspecified hearing loss, unspecified ear: Secondary | ICD-10-CM

## 2016-09-12 ENCOUNTER — Other Ambulatory Visit: Payer: PPO

## 2016-10-02 ENCOUNTER — Other Ambulatory Visit: Payer: Self-pay | Admitting: Cardiovascular Disease

## 2016-10-02 MED ORDER — RIVAROXABAN 20 MG PO TABS: 20.0000 mg | ORAL_TABLET | Freq: Every day | ORAL | 6 refills | Status: DC

## 2016-10-02 NOTE — Telephone Encounter (Signed)
Medication sent to pharmacy  

## 2016-10-02 NOTE — Telephone Encounter (Signed)
rivaroxaban (XARELTO) 20 MG TABS tablet   [facility]

## 2016-10-30 ENCOUNTER — Other Ambulatory Visit: Payer: Self-pay | Admitting: Physician Assistant

## 2017-02-21 ENCOUNTER — Other Ambulatory Visit: Payer: Self-pay | Admitting: Physician Assistant

## 2017-04-11 ENCOUNTER — Other Ambulatory Visit: Payer: Self-pay | Admitting: Cardiovascular Disease

## 2017-04-11 MED ORDER — RIVAROXABAN 20 MG PO TABS: 20.0000 mg | ORAL_TABLET | Freq: Every day | ORAL | 3 refills | Status: DC

## 2017-04-11 NOTE — Telephone Encounter (Signed)
°*  STAT* If patient is at the pharmacy, call can be transferred to refill team.   1. Which medications need to be refilled? rivaroxaban (XARELTO) 20 MG TABS tablet    2. Which pharmacy/location (including street and city if local pharmacy) is medication to be sent to  [facility]  3. Do they need a 30 day or 90 day supply?

## 2017-04-11 NOTE — Telephone Encounter (Signed)
Done

## 2017-05-01 ENCOUNTER — Other Ambulatory Visit: Payer: Self-pay | Admitting: Cardiology

## 2017-05-29 ENCOUNTER — Other Ambulatory Visit: Payer: Self-pay | Admitting: Physician Assistant

## 2017-06-30 ENCOUNTER — Telehealth: Payer: Self-pay | Admitting: Cardiovascular Disease

## 2017-06-30 ENCOUNTER — Encounter: Payer: Self-pay | Admitting: *Deleted

## 2017-06-30 NOTE — Telephone Encounter (Signed)
Records not available in [place] will request. Pt says she went for chest pain and EKG and chest CT were done and was told everything was normal. Was not admitted. Pt denies any symptoms or chest pain since discharge. Pt is scheduled with Dr Bronson Ing on 12/18. Will request records and see if Dr Bronson Ing would like to see her prior to scheduled appt.

## 2017-06-30 NOTE — Telephone Encounter (Signed)
Was in [place] over the weekend,   Was told to contact our office to see if she needs to see Dr Bronson Ing or just change meds?   Dawn Anderson that records should be in [employer]

## 2017-07-22 ENCOUNTER — Encounter: Payer: Self-pay | Admitting: Cardiovascular Disease

## 2017-07-22 ENCOUNTER — Ambulatory Visit: Payer: PPO | Admitting: Cardiovascular Disease

## 2017-07-22 VITALS — BP 150/88 | HR 101 | Ht 67.0 in

## 2017-07-22 DIAGNOSIS — Z9289 Personal history of other medical treatment: Secondary | ICD-10-CM

## 2017-07-22 DIAGNOSIS — I10 Essential (primary) hypertension: Secondary | ICD-10-CM

## 2017-07-22 DIAGNOSIS — I480 Paroxysmal atrial fibrillation: Secondary | ICD-10-CM

## 2017-07-22 DIAGNOSIS — R079 Chest pain, unspecified: Secondary | ICD-10-CM

## 2017-07-22 NOTE — Patient Instructions (Signed)
Medication Instructions:  Continue all current medications.  Labwork: none  Testing/Procedures: none  Follow-Up: 2 months   Any Other Special Instructions Will Be Listed Below (If Applicable).  If you need a refill on your cardiac medications before your next appointment, please call your pharmacy.  

## 2017-07-22 NOTE — Progress Notes (Signed)
SUBJECTIVE: The patient presents for follow-up of paroxysmal atrial fibrillation.  She is anticoagulated with Xarelto.  She has multiple drug and food allergies.  ECG performed today which I personally interpreted demonstrated sinus rhythm with old anteroseptal infarct pattern.  She was apparently evaluated at [facility] a week before Thanksgiving for chest pain.  She tells me that they did blood tests, and ECG, and a CT angiogram of the chest and she was told all findings were normal.  I have requested records.  Upon further speaking with her, she had some cough, rhinorrhea, and nasal congestion a week before hand.  Her chest pain improved until the past 2 days when she has had some residual right-sided mild chest pressure.  It is worse with deep inspiration.    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.   Review of Systems: As per "subjective", otherwise negative.  Allergies  Allergen Reactions  . Apple Anaphylaxis  . Asa [Aspirin] Anaphylaxis  . Coconut Oil Itching  . Origanum Oil Anaphylaxis  . Pineapple Anaphylaxis  . Plastibase Itching  . Prunus Persica Anaphylaxis  . Salicylates Anaphylaxis  . Salmeterol Anaphylaxis  . Strawberry (Diagnostic) Anaphylaxis  . Sulfamethizole Itching  . Chocolate Hives  . Clarithromycin Rash  . Penicillins Nausea Only and Rash    Current Outpatient Medications  Medication Sig Dispense Refill  . B Complex Vitamins (VITAMIN-B COMPLEX PO) Take 1 tablet by mouth daily.    Marland Kitchen MILK THISTLE PO Take by mouth daily.    . montelukast (SINGULAIR) 10 MG tablet Take 10 mg by mouth at bedtime.     . Multiple Vitamin (MULTIVITAMIN) tablet Take 1 tablet by mouth daily.    . NON FORMULARY CPAP MACHINE    . SYMBICORT 160-4.5 MCG/ACT inhaler Inhale 2 puffs into the lungs 2 (two) times daily.     Alveda Reasons 20 MG TABS tablet TAKE 1 TABLET BY MOUTH DAILY WITH SUPPER. 30 tablet 3  . XOLAIR  150 MG injection Inject 150 mg into the skin every 28 (twenty-eight) days.     [name] HFA 45 MCG/ACT inhaler Inhale 2 puffs into the lungs as needed.      No current facility-administered medications for this visit.     Past Medical History:  Diagnosis Date  . Asthma     Past Surgical History:  Procedure Laterality Date  . ABDOMINAL HYSTERECTOMY    . BACK SURGERY     x2  . BREAST SURGERY Left    lump removed  . CHOLECYSTECTOMY    . COLONOSCOPY N/A 03/22/2015   Procedure: COLONOSCOPY;  Surgeon: Rogene Houston, MD;  Location[facility];  Service: Endoscopy;  Laterality: N/A;  830 - moved to 8/17 @ 7:30 - Ann to notify pt  . NASAL SINUS SURGERY    . SHOULDER ARTHROSCOPY W/ ROTATOR CUFF REPAIR Right     Social History   Socioeconomic History  . Marital status: Widowed    Spouse name: Not on file  . Number of children: Not on file  . Years of education: Not on file  . Highest education level: Not on file  Social Needs  . Financial resource strain: Not on file  . Food insecurity - worry: Not on file  . Food insecurity - inability: Not on file  . Transportation needs - medical: Not on file  . Transportation needs - non-medical: Not on file  Occupational  History  . Not on file  Tobacco Use  . Smoking status: Never Smoker  . Smokeless tobacco: Never Used  Substance and Sexual Activity  . Alcohol use: Yes    Comment: occ.   . Drug use: No  . Sexual activity: Not Currently    Birth control/protection: Surgical    Comment: hyst  Other Topics Concern  . Not on file  Social History Narrative  . Not on file     Vitals:   07/22/17 1558  BP: (!) 150/88  Pulse: (!) 101  SpO2: 98%  Height: 5\' 7"  (1.702 m)    Wt Readings from Last 3 Encounters:  07/12/16 200 lb (90.7 kg)  03/22/15 217 lb (98.4 kg)     PHYSICAL EXAM General: NAD HEENT: Normal. Neck: No JVD, no thyromegaly. Lungs: Clear to auscultation bilaterally with normal respiratory effort. CV:  Regular rate and rhythm, normal S1/S2, no S3/S4, no murmur. No pretibial or periankle edema. Abdomen: Soft, nontender, no distention.  Neurologic: Alert and oriented.  Psych: Normal affect. Skin: Normal. Musculoskeletal: No gross deformities.    ECG: Most recent ECG reviewed.   Labs: No results found for: K, BUN, CREATININE, ALT, TSH, HGB   Lipids: No results found for: LDLCALC, LDLDIRECT, CHOL, TRIG, HDL     ASSESSMENT AND PLAN: 1. Paroxysmal atrial fibrillation: Symptomatically stable.  Continue Xarelto to reduce thromboembolic risk. CHADSVASC is 3.  2. Essential HTN: Blood pressure is again elevated.  She said she was rushed today.  I recommended she continue to monitor her blood pressure as she may require antihypertensive therapy.  3.  Chest pain: I will request records (ECG, blood test, documentation, CT angiogram of the chest).  Given the fact that is worse with deep inspiration and it was preceded by what sounds like an upper respiratory viral infection, she likely has some degree of pleurisy.  There are no ECG findings nor pericardial rub to suggest pericarditis.  She is intolerant of salicylates/NSAIDs.  I told her should symptoms persist or worsen, one could consider treatment with prednisone.   Disposition: Follow up 2 months  Time spent: 40 minutes, of which greater than 50% was spent reviewing symptoms, relevant blood tests and studies, and discussing management plan with the patient.   Kate Sable, M.D., F.A.C.C.

## 2017-07-23 ENCOUNTER — Encounter: Payer: Self-pay | Admitting: *Deleted

## 2017-09-22 ENCOUNTER — Encounter: Payer: Self-pay | Admitting: Cardiovascular Disease

## 2017-09-22 ENCOUNTER — Ambulatory Visit: Payer: Medicare Other | Admitting: Cardiovascular Disease

## 2017-09-22 ENCOUNTER — Other Ambulatory Visit: Payer: Self-pay

## 2017-09-22 VITALS — BP 170/88 | HR 98 | Ht 68.0 in

## 2017-09-22 DIAGNOSIS — I480 Paroxysmal atrial fibrillation: Secondary | ICD-10-CM

## 2017-09-22 DIAGNOSIS — I10 Essential (primary) hypertension: Secondary | ICD-10-CM

## 2017-09-22 NOTE — Progress Notes (Signed)
SUBJECTIVE: The patient presents for follow-up of paroxysmal atrial fibrillation and hypertension.  She is anticoagulated with Xarelto.  She has multiple drug and food allergies.  The patient denies any symptoms of chest pain, palpitations, shortness of breath, lightheadedness, dizziness, leg swelling, orthopnea, PND, and syncope.    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.  Review of Systems: As per "subjective", otherwise negative.  Allergies  Allergen Reactions  . Apple Anaphylaxis  . Asa [Aspirin] Anaphylaxis  . Coconut Oil Itching  . Origanum Oil Anaphylaxis  . Pineapple Anaphylaxis  . Plastibase Itching  . Prunus Persica Anaphylaxis  . Salicylates Anaphylaxis  . Salmeterol Anaphylaxis  . Strawberry (Diagnostic) Anaphylaxis  . Sulfamethizole Itching  . Chocolate Hives  . Clarithromycin Rash  . Penicillins Nausea Only and Rash    Current Outpatient Medications  Medication Sig Dispense Refill  . B Complex Vitamins (VITAMIN-B COMPLEX PO) Take 1 tablet by mouth daily.    Marland Kitchen losartan-hydrochlorothiazide (HYZAAR) 50-12.5 MG tablet Take 1 tablet by mouth every morning.  2  . MILK THISTLE PO Take by mouth daily.    . montelukast (SINGULAIR) 10 MG tablet Take 10 mg by mouth at bedtime.     . Multiple Vitamin (MULTIVITAMIN) tablet Take 1 tablet by mouth daily.    . NON FORMULARY CPAP MACHINE    . SYMBICORT 160-4.5 MCG/ACT inhaler Inhale 2 puffs into the lungs 2 (two) times daily.     Alveda Reasons 20 MG TABS tablet TAKE 1 TABLET BY MOUTH DAILY WITH SUPPER. 30 tablet 3  . XOLAIR 150 MG injection Inject 150 mg into the skin every 28 (twenty-eight) days.     [name] HFA 45 MCG/ACT inhaler Inhale 2 puffs into the lungs as needed.      No current facility-administered medications for this visit.     Past Medical History:  Diagnosis Date  . Asthma     Past Surgical History:  Procedure Laterality Date  . ABDOMINAL  HYSTERECTOMY    . BACK SURGERY     x2  . BREAST SURGERY Left    lump removed  . CHOLECYSTECTOMY    . COLONOSCOPY N/A 03/22/2015   Procedure: COLONOSCOPY;  Surgeon: Rogene Houston, MD;  Location: [facility];  Service: Endoscopy;  Laterality: N/A;  830 - moved to 8/17 @ 7:30 - Ann to notify pt  . NASAL SINUS SURGERY    . SHOULDER ARTHROSCOPY W/ ROTATOR CUFF REPAIR Right     Social History   Socioeconomic History  . Marital status: Widowed    Spouse name: Not on file  . Number of children: Not on file  . Years of education: Not on file  . Highest education level: Not on file  Social Needs  . Financial resource strain: Not on file  . Food insecurity - worry: Not on file  . Food insecurity - inability: Not on file  . Transportation needs - medical: Not on file  . Transportation needs - non-medical: Not on file  Occupational History  . Not on file  Tobacco Use  . Smoking status: Never Smoker  . Smokeless tobacco: Never Used  Substance and Sexual Activity  . Alcohol use: Yes    Comment: occ.   . Drug use: No  . Sexual activity: Not Currently    Birth control/protection: Surgical    Comment: hyst  Other Topics Concern  . Not on file  Social History Narrative  . Not on file     Vitals:   09/22/17 1303  BP: (!) 170/88  Pulse: 98  SpO2: 95%  Height: 5\' 8"  (1.727 m)    Wt Readings from Last 3 Encounters:  07/12/16 200 lb (90.7 kg)  03/22/15 217 lb (98.4 kg)     PHYSICAL EXAM General: NAD HEENT: Normal. Neck: No JVD, no thyromegaly. Lungs: Clear to auscultation bilaterally with normal respiratory effort. CV: Regular rate and rhythm, normal S1/S2, no S3/S4, no murmur. No pretibial or periankle edema.  No carotid bruit.   Abdomen: Soft, nontender, no distention.  Neurologic: Alert and oriented.  Psych: Normal affect. Skin: Normal. Musculoskeletal: No gross deformities.    ECG: Most recent ECG reviewed.   Labs: No results found for: K, BUN, CREATININE,  ALT, TSH, HGB   Lipids: No results found for: LDLCALC, LDLDIRECT, CHOL, TRIG, HDL     ASSESSMENT AND PLAN:  1.Paroxysmal atrial fibrillation: Symptomatically stable.  Continue Xarelto to reduce thromboembolic risk. CHADSVASC is 3.  2. Essential HTN: Blood pressure is again elevated.  She said she was rushed today.  She is now on losartan-hydrochlorothiazide 50-12.5 mg daily.  She monitors blood pressures at home and they range from 109-130/60-70.  No changes to therapy.  3.  Chest pain: No recurrences.     Disposition: Follow up 6 months   Kate Sable, M.D., F.A.C.C.

## 2017-09-22 NOTE — Patient Instructions (Signed)

## 2017-12-04 ENCOUNTER — Other Ambulatory Visit: Payer: Medicare Other | Admitting: Adult Health

## 2017-12-10 ENCOUNTER — Encounter: Payer: Self-pay | Admitting: Adult Health

## 2017-12-10 ENCOUNTER — Other Ambulatory Visit: Payer: Medicare Other | Admitting: Adult Health

## 2017-12-10 ENCOUNTER — Ambulatory Visit: Payer: Medicare Other | Admitting: Adult Health

## 2017-12-10 ENCOUNTER — Encounter (INDEPENDENT_AMBULATORY_CARE_PROVIDER_SITE_OTHER): Payer: Self-pay

## 2017-12-10 VITALS — BP 128/60 | HR 78 | Ht 69.0 in

## 2017-12-10 DIAGNOSIS — Z01419 Encounter for gynecological examination (general) (routine) without abnormal findings: Secondary | ICD-10-CM

## 2017-12-10 DIAGNOSIS — Z1212 Encounter for screening for malignant neoplasm of rectum: Secondary | ICD-10-CM

## 2017-12-10 DIAGNOSIS — Z1211 Encounter for screening for malignant neoplasm of colon: Secondary | ICD-10-CM | POA: Diagnosis not present

## 2017-12-10 LAB — HEMOCCULT GUIAC POC 1CARD (OFFICE): FECAL OCCULT BLD: NEGATIVE

## 2017-12-10 MED ORDER — NYSTATIN-TRIAMCINOLONE 100000-0.1 UNIT/GM-% EX CREA: 1.0000 "application " | TOPICAL_CREAM | Freq: Two times a day (BID) | CUTANEOUS | 0 refills | Status: DC

## 2017-12-10 NOTE — Progress Notes (Signed)
Patient ID: Dawn Anderson, female   DOB: June 05, 1946, 72 y.o.   MRN: [id number] History of Present Illness:  Dawn Anderson is a 72 year old white female in for a well woman gyn exam,she is sp hysterectomy. PCPis Dr Nadara Mustard.  Current Medications, Allergies, Past Medical History, Past Surgical History, Family History and Social History were reviewed in [employer] record.     Review of Systems: Patient denies any headaches, hearing loss, fatigue, blurred vision, shortness of breath, chest pain, abdominal pain, problems with bowel movements, urination, or intercourse(not active). No joint pain or mood swings.    Physical Exam:BP 128/60 (BP Location: Left Arm, Patient Position: Sitting, Cuff Size: Large)   Pulse 78   Ht 5\' 9"  (1.753 m)   BMI 29.53 kg/m  General:  Well developed, well nourished, no acute distress Skin:  Warm and dry,tan Neck:  Midline trachea, normal thyroid, good ROM, no lymphadenopathy,no carotid bruits heard Lungs; Clear to auscultation bilaterally Breast:  No dominant palpable mass, retraction, or nipple discharge Cardiovascular: Regular rate and rhythm Abdomen:  Soft, non tender, no hepatosplenomegaly Pelvic:  External genitalia is normal in appearance, no lesions.  The vagina is pale with loss of moisture and rugae. The cervix and uterus are absent.  No adnexal masses or tenderness noted.Bladder is non tender, no masses felt. Rectal: Good sphincter tone, no polyps, or hemorrhoids felt.  Hemoccult negative. Extremities/musculoskeletal:  No swelling or varicosities noted, no clubbing or cyanosis Psych:  No mood changes, alert and cooperative,seems happy PHQ 2 score 1.   Impression: 1. Encounter for well woman exam with routine gynecological exam   2. Screening for colorectal cancer       Plan: Physical in 2 year Mammogram yearly Labs with PCP Colonoscopy per GI She said she had DEXA at [facility]

## 2017-12-31 ENCOUNTER — Other Ambulatory Visit: Payer: Self-pay | Admitting: Cardiovascular Disease

## 2018-05-04 ENCOUNTER — Telehealth: Payer: Self-pay | Admitting: Cardiovascular Disease

## 2018-05-04 ENCOUNTER — Other Ambulatory Visit: Payer: Self-pay | Admitting: Cardiovascular Disease

## 2018-05-04 NOTE — Telephone Encounter (Signed)
° ° ° °  1. Which medications need to be refilled? (please list name of each medication and dose if known) Xarelto   2. Which pharmacy/location (including street and city if local pharmacy) is medication to be sent to? [facility]   3. Do they need a 30 day or 90 day supply?

## 2018-05-05 NOTE — Telephone Encounter (Signed)
Please provide a 30-day supply and have her see an APP for follow up.

## 2018-05-06 MED ORDER — RIVAROXABAN 20 MG PO TABS: 20.0000 mg | ORAL_TABLET | Freq: Every day | ORAL | 0 refills | Status: DC

## 2018-05-11 ENCOUNTER — Other Ambulatory Visit: Payer: Self-pay | Admitting: Cardiovascular Disease

## 2018-05-11 MED ORDER — RIVAROXABAN 20 MG PO TABS: 20.0000 mg | ORAL_TABLET | Freq: Every day | ORAL | 1 refills | Status: DC

## 2018-05-11 NOTE — Telephone Encounter (Signed)
°*  STAT* If patient is at the pharmacy, call can be transferred to refill team.   1. Which medications need to be refilled?  rivaroxaban (XARELTO) 20 MG TABS tablet    2. Which pharmacy/location (including street and city if local pharmacy) is medication to be sent to?  [facility]  3. Do they need a 30 day or 90 day supply? Needs enough until her appointment with Dr Bronson Ing

## 2018-07-05 ENCOUNTER — Other Ambulatory Visit: Payer: Self-pay | Admitting: Cardiovascular Disease

## 2018-07-06 ENCOUNTER — Encounter: Payer: Self-pay | Admitting: Cardiovascular Disease

## 2018-07-06 ENCOUNTER — Ambulatory Visit: Payer: Medicare Other | Admitting: Cardiovascular Disease

## 2018-07-06 VITALS — BP 132/76 | HR 91 | Ht 68.0 in | Wt 220.0 lb

## 2018-07-06 DIAGNOSIS — Z7901 Long term (current) use of anticoagulants: Secondary | ICD-10-CM | POA: Diagnosis not present

## 2018-07-06 DIAGNOSIS — I10 Essential (primary) hypertension: Secondary | ICD-10-CM

## 2018-07-06 DIAGNOSIS — I480 Paroxysmal atrial fibrillation: Secondary | ICD-10-CM | POA: Diagnosis not present

## 2018-07-06 NOTE — Patient Instructions (Signed)

## 2018-07-06 NOTE — Progress Notes (Signed)
SUBJECTIVE: The patient presents for follow-up of paroxysmal atrial fibrillation and hypertension. She is anticoagulated with Xarelto. She has multiple drug and food allergies.  ECG performed in the office today which I ordered and personally interpreted demonstrates normal sinus rhythm, heart rate 100 bpm, with anteroseptal Q waves.  The patient denies any symptoms of chest pain, palpitations, shortness of breath, lightheadedness, dizziness, leg swelling, orthopnea, PND, and syncope.  She spent Thanksgiving at the [employer] and [place], [place].  In a week she will be leaving for [place] with her 71-year-old grandson.   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.  Review of Systems: As per "subjective", otherwise negative.  Allergies  Allergen Reactions  . Apple Anaphylaxis  . Asa [Aspirin] Anaphylaxis  . Coconut Oil Itching  . Origanum Oil Anaphylaxis  . Pineapple Anaphylaxis  . Plastibase Itching  . Prunus Persica Anaphylaxis  . Salicylates Anaphylaxis  . Salmeterol Anaphylaxis  . Strawberry (Diagnostic) Anaphylaxis  . Sulfamethizole Itching  . Chocolate Hives  . Clarithromycin Rash  . Penicillins Nausea Only and Rash    Current Outpatient Medications  Medication Sig Dispense Refill  . B Complex Vitamins (VITAMIN-B COMPLEX PO) Take 1 tablet by mouth daily.    Marland Kitchen losartan-hydrochlorothiazide (HYZAAR) 50-12.5 MG tablet Take 1 tablet by mouth every morning.  2  . MILK THISTLE PO Take by mouth daily.    . montelukast (SINGULAIR) 10 MG tablet Take 10 mg by mouth at bedtime.     . Multiple Vitamin (MULTIVITAMIN) tablet Take 1 tablet by mouth daily.    . NON FORMULARY CPAP MACHINE    . nystatin-triamcinolone (MYCOLOG II) cream Apply 1 application topically 2 (two) times daily. Apply thin layer bid prn 30 g 0  . SYMBICORT 160-4.5 MCG/ACT inhaler Inhale 2 puffs into the lungs 2 (two) times daily.     Alveda Reasons  20 MG TABS tablet TAKE 1 TABLET BY MOUTH DAILY WITH SUPPER 30 tablet 1  . XOLAIR 150 MG injection Inject 150 mg into the skin every 28 (twenty-eight) days.     [name] HFA 45 MCG/ACT inhaler Inhale 2 puffs into the lungs as needed.      No current facility-administered medications for this visit.     Past Medical History:  Diagnosis Date  . Asthma     Past Surgical History:  Procedure Laterality Date  . ABDOMINAL HYSTERECTOMY    . BACK SURGERY     x2  . BREAST SURGERY Left    lump removed  . CHOLECYSTECTOMY    . COLONOSCOPY N/A 03/22/2015   Procedure: COLONOSCOPY;  Surgeon: Rogene Houston, MD;  [facility];  Service: Endoscopy;  Laterality: N/A;  830 - moved to 8/17 @ 7:30 - Ann to notify pt  . NASAL SINUS SURGERY    . SHOULDER ARTHROSCOPY W/ ROTATOR CUFF REPAIR Right     Social History   Socioeconomic History  . Marital status: Widowed    Spouse name: Not on file  . Number of children: Not on file  . Years of education: Not on file  . Highest education level: Not on file  Occupational History  . Not on file  Social Needs  . Financial resource strain: Not on file  . Food insecurity:    Worry: Not on file    Inability: Not on file  . Transportation needs:    Medical: Not on file  Non-medical: Not on file  Tobacco Use  . Smoking status: Never Smoker  . Smokeless tobacco: Never Used  Substance and Sexual Activity  . Alcohol use: Yes    Comment: occ.   . Drug use: No  . Sexual activity: Not Currently    Birth control/protection: Surgical    Comment: hyst  Lifestyle  . Physical activity:    Days per week: Not on file    Minutes per session: Not on file  . Stress: Not on file  Relationships  . Social connections:    Talks on phone: Not on file    Gets together: Not on file    Attends religious service: Not on file    Active member of club or organization: Not on file    Attends meetings of clubs or organizations: Not on file    Relationship  status: Not on file  . Intimate partner violence:    Fear of current or ex partner: Not on file    Emotionally abused: Not on file    Physically abused: Not on file    Forced sexual activity: Not on file  Other Topics Concern  . Not on file  Social History Narrative  . Not on file     Vitals:   07/06/18 1515  BP: 132/76  Pulse: 91  SpO2: 93%  Weight: 220 lb (99.8 kg)  Height: 5\' 8"  (1.727 m)    Wt Readings from Last 3 Encounters:  07/06/18 220 lb (99.8 kg)  07/12/16 200 lb (90.7 kg)  03/22/15 217 lb (98.4 kg)     PHYSICAL EXAM General: NAD HEENT: Normal. Neck: No JVD, no thyromegaly. Lungs: Clear to auscultation bilaterally with normal respiratory effort. CV: Regular rate and rhythm, normal S1/S2, no S3/S4, no murmur. No pretibial or periankle edema.  No carotid bruit.   Abdomen: Soft, nontender, no distention.  Neurologic: Alert and oriented.  Psych: Normal affect. Skin: Normal. Musculoskeletal: No gross deformities.    ECG: Reviewed above under Subjective   Labs: No results found for: K, BUN, CREATININE, ALT, TSH, HGB   Lipids: No results found for: LDLCALC, LDLDIRECT, CHOL, TRIG, HDL     ASSESSMENT AND PLAN:  1.Paroxysmal atrial fibrillation:Symptomatically stable.Continue Xarelto to reduce thromboembolic risk. CHADSVASCis 3.  2. Essential XBD:ZHGDJ pressure is normal No changes to therapy.   Disposition: Follow up 1 year.   Kate Sable, M.D., F.A.C.C.

## 2018-10-05 ENCOUNTER — Other Ambulatory Visit: Payer: Self-pay | Admitting: Cardiovascular Disease

## 2019-04-15 ENCOUNTER — Ambulatory Visit (INDEPENDENT_AMBULATORY_CARE_PROVIDER_SITE_OTHER): Payer: Medicare Other | Admitting: Otolaryngology

## 2019-04-15 DIAGNOSIS — H903 Sensorineural hearing loss, bilateral: Secondary | ICD-10-CM | POA: Diagnosis not present

## 2019-04-15 DIAGNOSIS — H7202 Central perforation of tympanic membrane, left ear: Secondary | ICD-10-CM

## 2019-04-15 DIAGNOSIS — H9313 Tinnitus, bilateral: Secondary | ICD-10-CM

## 2019-04-15 DIAGNOSIS — R42 Dizziness and giddiness: Secondary | ICD-10-CM

## 2019-07-06 ENCOUNTER — Other Ambulatory Visit: Payer: Self-pay

## 2019-07-06 ENCOUNTER — Encounter: Payer: Self-pay | Admitting: Cardiovascular Disease

## 2019-07-06 ENCOUNTER — Ambulatory Visit: Payer: Medicare Other | Admitting: Cardiovascular Disease

## 2019-07-06 VITALS — BP 160/72 | HR 69 | Ht 68.0 in

## 2019-07-06 DIAGNOSIS — I10 Essential (primary) hypertension: Secondary | ICD-10-CM | POA: Diagnosis not present

## 2019-07-06 DIAGNOSIS — I480 Paroxysmal atrial fibrillation: Secondary | ICD-10-CM | POA: Diagnosis not present
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
## 2019-07-06 NOTE — Patient Instructions (Signed)

## 2019-08-30 DIAGNOSIS — J4550 Severe persistent asthma, uncomplicated: Secondary | ICD-10-CM | POA: Diagnosis not present

## 2019-08-30 DIAGNOSIS — J4542 Moderate persistent asthma with status asthmaticus: Secondary | ICD-10-CM | POA: Diagnosis not present

## 2019-09-13 DIAGNOSIS — H25813 Combined forms of age-related cataract, bilateral: Secondary | ICD-10-CM | POA: Diagnosis not present

## 2019-09-13 DIAGNOSIS — E119 Type 2 diabetes mellitus without complications: Secondary | ICD-10-CM | POA: Diagnosis not present

## 2019-09-13 DIAGNOSIS — H0102B Squamous blepharitis left eye, upper and lower eyelids: Secondary | ICD-10-CM | POA: Diagnosis not present

## 2019-09-13 DIAGNOSIS — H04123 Dry eye syndrome of bilateral lacrimal glands: Secondary | ICD-10-CM | POA: Diagnosis not present

## 2019-09-13 DIAGNOSIS — H0102A Squamous blepharitis right eye, upper and lower eyelids: Secondary | ICD-10-CM | POA: Diagnosis not present

## 2019-09-14 ENCOUNTER — Telehealth: Payer: Self-pay | Admitting: Cardiovascular Disease

## 2019-09-14 NOTE — Telephone Encounter (Signed)
Patient informed and verbalized understanding of plan. 

## 2019-09-14 NOTE — Telephone Encounter (Signed)
Patient called stating that she is having Cataract surgery on 10/01/2019 with Dr. Katy Fitch. She was reading information given to her. She is on Xarelto and was concerned about going off of it.

## 2019-09-14 NOTE — Telephone Encounter (Signed)
Ok to continue from my perspective. If ophthalmologist insists on stopping it, would do so morning of and night prior to procedure. It is a low risk procedure.

## 2019-09-27 DIAGNOSIS — J4542 Moderate persistent asthma with status asthmaticus: Secondary | ICD-10-CM | POA: Diagnosis not present

## 2019-09-27 DIAGNOSIS — J4550 Severe persistent asthma, uncomplicated: Secondary | ICD-10-CM | POA: Diagnosis not present

## 2019-09-30 DIAGNOSIS — G4733 Obstructive sleep apnea (adult) (pediatric): Secondary | ICD-10-CM | POA: Diagnosis not present

## 2019-09-30 DIAGNOSIS — T887XXA Unspecified adverse effect of drug or medicament, initial encounter: Secondary | ICD-10-CM | POA: Diagnosis not present

## 2019-09-30 DIAGNOSIS — F419 Anxiety disorder, unspecified: Secondary | ICD-10-CM | POA: Diagnosis not present

## 2019-09-30 DIAGNOSIS — J4540 Moderate persistent asthma, uncomplicated: Secondary | ICD-10-CM | POA: Diagnosis not present

## 2019-10-01 DIAGNOSIS — H25812 Combined forms of age-related cataract, left eye: Secondary | ICD-10-CM | POA: Diagnosis not present

## 2019-10-13 DIAGNOSIS — H2511 Age-related nuclear cataract, right eye: Secondary | ICD-10-CM | POA: Diagnosis not present

## 2019-10-15 DIAGNOSIS — H25811 Combined forms of age-related cataract, right eye: Secondary | ICD-10-CM | POA: Diagnosis not present

## 2019-10-17 ENCOUNTER — Other Ambulatory Visit: Payer: Self-pay | Admitting: Cardiovascular Disease

## 2019-10-25 DIAGNOSIS — J4542 Moderate persistent asthma with status asthmaticus: Secondary | ICD-10-CM | POA: Diagnosis not present

## 2019-10-25 DIAGNOSIS — J4550 Severe persistent asthma, uncomplicated: Secondary | ICD-10-CM | POA: Diagnosis not present

## 2019-11-16 ENCOUNTER — Ambulatory Visit: Payer: Medicare PPO | Admitting: Physician Assistant

## 2019-11-16 ENCOUNTER — Other Ambulatory Visit: Payer: Self-pay

## 2019-11-16 ENCOUNTER — Encounter: Payer: Self-pay | Admitting: Physician Assistant

## 2019-11-16 DIAGNOSIS — L719 Rosacea, unspecified: Secondary | ICD-10-CM

## 2019-11-16 DIAGNOSIS — Z1283 Encounter for screening for malignant neoplasm of skin: Secondary | ICD-10-CM

## 2019-11-16 DIAGNOSIS — L820 Inflamed seborrheic keratosis: Secondary | ICD-10-CM | POA: Diagnosis not present

## 2019-11-16 MED ORDER — METRONIDAZOLE 0.75 % EX CREA: TOPICAL_CREAM | Freq: Every day | CUTANEOUS | 10 refills | Status: AC

## 2019-11-22 DIAGNOSIS — J4542 Moderate persistent asthma with status asthmaticus: Secondary | ICD-10-CM | POA: Diagnosis not present

## 2019-11-22 DIAGNOSIS — J4550 Severe persistent asthma, uncomplicated: Secondary | ICD-10-CM | POA: Diagnosis not present

## 2019-11-22 DIAGNOSIS — L568 Other specified acute skin changes due to ultraviolet radiation: Secondary | ICD-10-CM | POA: Diagnosis not present

## 2019-12-09 DIAGNOSIS — Z961 Presence of intraocular lens: Secondary | ICD-10-CM | POA: Diagnosis not present

## 2019-12-20 DIAGNOSIS — J4542 Moderate persistent asthma with status asthmaticus: Secondary | ICD-10-CM | POA: Diagnosis not present

## 2019-12-20 DIAGNOSIS — J4550 Severe persistent asthma, uncomplicated: Secondary | ICD-10-CM | POA: Diagnosis not present

## 2019-12-20 DIAGNOSIS — J45909 Unspecified asthma, uncomplicated: Secondary | ICD-10-CM | POA: Diagnosis not present

## 2020-01-18 DIAGNOSIS — J4550 Severe persistent asthma, uncomplicated: Secondary | ICD-10-CM | POA: Diagnosis not present

## 2020-01-18 DIAGNOSIS — J4542 Moderate persistent asthma with status asthmaticus: Secondary | ICD-10-CM | POA: Diagnosis not present

## 2020-01-18 DIAGNOSIS — J45909 Unspecified asthma, uncomplicated: Secondary | ICD-10-CM | POA: Diagnosis not present

## 2020-01-19 NOTE — Progress Notes (Signed)
   Follow-Up Visit   Subjective  ELEXA KIVI is a 74 y.o. female who presents for the following: Annual Exam (Couple of places on her back that itch. She states that at her last visit you told her it was a nerve itch.).   The following portions of the chart were reviewed this encounter and updated as appropriate: Tobacco  Allergies  Meds  Problems  Med Hx  Surg Hx  Fam Hx      Objective  Well appearing patient in no apparent distress; mood and affect are within normal limits.  A full examination was performed including scalp, head, eyes, ears, nose, lips, neck, chest, axillae, abdomen, back, buttocks, bilateral upper extremities, bilateral lower extremities, hands, feet, fingers, toes, fingernails, and toenails. All findings within normal limits unless otherwise noted below.  Objective  Head to Toe: No DN  Assessment & Plan  Rosacea Head - Anterior (Face)  Ordered Medications: metroNIDAZOLE (METROCREAM) 0.75 % cream  Seborrheic keratoses, inflamed (5) Left Popliteal Fossa; Left Breast; Right Breast; Scalp (2)  Destruction of lesion - Left Breast, Left Popliteal Fossa, Right Breast, Scalp (2) Complexity: simple   Destruction method: cryotherapy   Informed consent: discussed and consent obtained   Timeout:  patient name, date of birth, surgical site, and procedure verified Lesion destroyed using liquid nitrogen: Yes   Cryotherapy cycles:  1 Outcome: patient tolerated procedure well with no complications   Post-procedure details: wound care instructions given    Screening exam for skin cancer Head to Toe  Yearly skin checks

## 2020-02-17 DIAGNOSIS — J4542 Moderate persistent asthma with status asthmaticus: Secondary | ICD-10-CM | POA: Diagnosis not present

## 2020-02-17 DIAGNOSIS — J4550 Severe persistent asthma, uncomplicated: Secondary | ICD-10-CM | POA: Diagnosis not present

## 2020-02-21 DIAGNOSIS — G4733 Obstructive sleep apnea (adult) (pediatric): Secondary | ICD-10-CM | POA: Diagnosis not present

## 2020-02-21 DIAGNOSIS — H6980 Other specified disorders of Eustachian tube, unspecified ear: Secondary | ICD-10-CM | POA: Diagnosis not present

## 2020-02-21 DIAGNOSIS — R42 Dizziness and giddiness: Secondary | ICD-10-CM | POA: Diagnosis not present

## 2020-03-16 DIAGNOSIS — J4542 Moderate persistent asthma with status asthmaticus: Secondary | ICD-10-CM | POA: Diagnosis not present

## 2020-03-16 DIAGNOSIS — J4550 Severe persistent asthma, uncomplicated: Secondary | ICD-10-CM | POA: Diagnosis not present

## 2020-03-22 DIAGNOSIS — Z1389 Encounter for screening for other disorder: Secondary | ICD-10-CM | POA: Diagnosis not present

## 2020-03-22 DIAGNOSIS — R202 Paresthesia of skin: Secondary | ICD-10-CM | POA: Diagnosis not present

## 2020-03-22 DIAGNOSIS — Z1331 Encounter for screening for depression: Secondary | ICD-10-CM | POA: Diagnosis not present

## 2020-03-22 DIAGNOSIS — J45909 Unspecified asthma, uncomplicated: Secondary | ICD-10-CM | POA: Diagnosis not present

## 2020-04-17 DIAGNOSIS — Z516 Encounter for desensitization to allergens: Secondary | ICD-10-CM | POA: Diagnosis not present

## 2020-04-19 DIAGNOSIS — M50222 Other cervical disc displacement at C5-C6 level: Secondary | ICD-10-CM | POA: Diagnosis not present

## 2020-04-19 DIAGNOSIS — M9971 Connective tissue and disc stenosis of intervertebral foramina of cervical region: Secondary | ICD-10-CM | POA: Diagnosis not present

## 2020-04-19 DIAGNOSIS — M542 Cervicalgia: Secondary | ICD-10-CM | POA: Diagnosis not present

## 2020-04-25 ENCOUNTER — Ambulatory Visit (INDEPENDENT_AMBULATORY_CARE_PROVIDER_SITE_OTHER): Payer: Medicare PPO | Admitting: Adult Health

## 2020-04-25 ENCOUNTER — Encounter: Payer: Self-pay | Admitting: Adult Health

## 2020-04-25 VITALS — BP 135/76 | HR 94 | Ht 68.0 in

## 2020-04-25 DIAGNOSIS — Z1211 Encounter for screening for malignant neoplasm of colon: Secondary | ICD-10-CM

## 2020-04-25 DIAGNOSIS — Z01419 Encounter for gynecological examination (general) (routine) without abnormal findings: Secondary | ICD-10-CM | POA: Diagnosis not present

## 2020-04-25 LAB — HEMOCCULT GUIAC POC 1CARD (OFFICE): Fecal Occult Blood, POC: NEGATIVE

## 2020-04-25 MED ORDER — NYSTATIN-TRIAMCINOLONE 100000-0.1 UNIT/GM-% EX CREA: 1.0000 "application " | TOPICAL_CREAM | Freq: Two times a day (BID) | CUTANEOUS | 3 refills | Status: DC

## 2020-04-25 NOTE — Progress Notes (Signed)
Patient ID: [name], female   DOB: December 02, 1945, 74 y.o.   MRN: [id number] History of Present Illness: Dawn Anderson is a 74 year old white female,widowed, sp hysterectomy in for well woman gyn exam. She has irritation in groin area PCP is Dr Nadara Mustard.   Current Medications, Allergies, Past Medical History, Past Surgical History, Family History and Social History were reviewed in [employer] record.     Review of Systems: Patient denies any headaches, hearing loss, fatigue, blurred vision, shortness of breath, chest pain, abdominal pain, problems with bowel movements, urination, or intercourse.(not active). No joint pain or mood swings.    Physical Exam:BP 135/76 (BP Location: Left Arm, Patient Position: Sitting, Cuff Size: Large)    Pulse 94    Ht 5\' 8"  (1.727 m)    BMI 33.45 kg/m  General:  Well developed, well nourished, no acute distress Skin:  Warm and dry Neck:  Midline trachea, normal thyroid, good ROM, no lymphadenopathy,no carotid bruits heard  Lungs; Clear to auscultation bilaterally Breast:  No dominant palpable mass, retraction, or nipple discharge Cardiovascular: Regular rate and rhythm Abdomen:  Soft, non tender, no hepatosplenomegaly Pelvic:  External genitalia is normal in appearance, no lesions.  The vagina is pale with loss of moisture and rugae. Urethra has no lesions or masses. The cervix and uterus are absent. No adnexal masses or tenderness noted.Bladder is non tender, no masses felt. Rectal: Good sphincter tone, no polyps, + hemorrhoids felt.  Hemoccult negative. Extremities/musculoskeletal:  No swelling or varicosities noted, no clubbing or cyanosis Psych:  No mood changes, alert and cooperative,seems happy AA is 0 Fall risk is low PHQ 9 score is 1  [facility] - 04/25/20 1055      Pregnancy Intention Screening   Does the patient want to become pregnant in the next year? N/A    Does the patient's partner want to become pregnant in the next  year? N/A    Would the patient like to discuss contraceptive options today? N/A      Contraception Wrap Up   Current Method No Method - Other Reason   hyst   End Method No Method - Other Reason   hyst   Contraception Counseling Provided No          Examination chaperoned by Levy Pupa LPN   Impression and Plan: 1. Encounter for well woman exam with routine gynecological exam Physical in 2 years Labs with PCP Mammogram yearly Colonoscopy per GI Will refill mycolog Meds ordered this encounter  Medications   nystatin-triamcinolone (MYCOLOG II) cream    Sig: Apply 1 application topically 2 (two) times daily. Apply thin layer bid prn    Dispense:  30 g    Refill:  3    Order Specific Question:   Supervising Provider    Answer:   Elonda Husky, LUTHER H [[id number]]    2. Encounter for screening fecal occult blood testing

## 2020-05-16 DIAGNOSIS — J45901 Unspecified asthma with (acute) exacerbation: Secondary | ICD-10-CM | POA: Diagnosis not present

## 2020-05-16 DIAGNOSIS — J4542 Moderate persistent asthma with status asthmaticus: Secondary | ICD-10-CM | POA: Diagnosis not present

## 2020-05-25 ENCOUNTER — Ambulatory Visit: Payer: Medicare PPO | Admitting: Physician Assistant

## 2020-05-30 DIAGNOSIS — Z1231 Encounter for screening mammogram for malignant neoplasm of breast: Secondary | ICD-10-CM | POA: Diagnosis not present

## 2020-06-07 DIAGNOSIS — R928 Other abnormal and inconclusive findings on diagnostic imaging of breast: Secondary | ICD-10-CM | POA: Diagnosis not present

## 2020-06-07 DIAGNOSIS — N6315 Unspecified lump in the right breast, overlapping quadrants: Secondary | ICD-10-CM | POA: Diagnosis not present

## 2020-06-07 DIAGNOSIS — R9389 Abnormal findings on diagnostic imaging of other specified body structures: Secondary | ICD-10-CM | POA: Diagnosis not present

## 2020-06-16 DIAGNOSIS — J4542 Moderate persistent asthma with status asthmaticus: Secondary | ICD-10-CM | POA: Diagnosis not present

## 2020-06-16 DIAGNOSIS — J4550 Severe persistent asthma, uncomplicated: Secondary | ICD-10-CM | POA: Diagnosis not present

## 2020-06-16 DIAGNOSIS — Z516 Encounter for desensitization to allergens: Secondary | ICD-10-CM | POA: Diagnosis not present

## 2020-06-21 DIAGNOSIS — N6312 Unspecified lump in the right breast, upper inner quadrant: Secondary | ICD-10-CM | POA: Diagnosis not present

## 2020-06-21 DIAGNOSIS — C779 Secondary and unspecified malignant neoplasm of lymph node, unspecified: Secondary | ICD-10-CM | POA: Diagnosis not present

## 2020-06-21 DIAGNOSIS — C50811 Malignant neoplasm of overlapping sites of right female breast: Secondary | ICD-10-CM | POA: Diagnosis not present

## 2020-06-21 DIAGNOSIS — Z170 Estrogen receptor positive status [ER+]: Secondary | ICD-10-CM | POA: Diagnosis not present

## 2020-06-21 DIAGNOSIS — C50911 Malignant neoplasm of unspecified site of right female breast: Secondary | ICD-10-CM | POA: Diagnosis not present

## 2020-06-21 DIAGNOSIS — N6315 Unspecified lump in the right breast, overlapping quadrants: Secondary | ICD-10-CM | POA: Diagnosis not present

## 2020-06-21 DIAGNOSIS — R590 Localized enlarged lymph nodes: Secondary | ICD-10-CM | POA: Diagnosis not present

## 2020-06-21 DIAGNOSIS — C773 Secondary and unspecified malignant neoplasm of axilla and upper limb lymph nodes: Secondary | ICD-10-CM | POA: Diagnosis not present

## 2020-07-03 DIAGNOSIS — C50811 Malignant neoplasm of overlapping sites of right female breast: Secondary | ICD-10-CM | POA: Diagnosis not present

## 2020-07-04 DIAGNOSIS — D126 Benign neoplasm of colon, unspecified: Secondary | ICD-10-CM | POA: Diagnosis not present

## 2020-07-04 DIAGNOSIS — E1165 Type 2 diabetes mellitus with hyperglycemia: Secondary | ICD-10-CM | POA: Insufficient documentation

## 2020-07-04 DIAGNOSIS — C50811 Malignant neoplasm of overlapping sites of right female breast: Secondary | ICD-10-CM | POA: Diagnosis not present

## 2020-07-05 ENCOUNTER — Ambulatory Visit: Payer: Medicare PPO | Admitting: Cardiology

## 2020-07-06 ENCOUNTER — Other Ambulatory Visit (HOSPITAL_COMMUNITY): Payer: Self-pay | Admitting: Oncology

## 2020-07-06 ENCOUNTER — Other Ambulatory Visit: Payer: Self-pay | Admitting: Oncology

## 2020-07-06 DIAGNOSIS — C50811 Malignant neoplasm of overlapping sites of right female breast: Secondary | ICD-10-CM

## 2020-07-07 DIAGNOSIS — C50811 Malignant neoplasm of overlapping sites of right female breast: Secondary | ICD-10-CM | POA: Diagnosis not present

## 2020-07-07 DIAGNOSIS — Z20822 Contact with and (suspected) exposure to covid-19: Secondary | ICD-10-CM | POA: Diagnosis not present

## 2020-07-07 DIAGNOSIS — Z01818 Encounter for other preprocedural examination: Secondary | ICD-10-CM | POA: Diagnosis not present

## 2020-07-07 DIAGNOSIS — Z01812 Encounter for preprocedural laboratory examination: Secondary | ICD-10-CM | POA: Diagnosis not present

## 2020-07-12 DIAGNOSIS — C7981 Secondary malignant neoplasm of breast: Secondary | ICD-10-CM | POA: Diagnosis not present

## 2020-07-12 DIAGNOSIS — Z01810 Encounter for preprocedural cardiovascular examination: Secondary | ICD-10-CM | POA: Diagnosis not present

## 2020-07-12 DIAGNOSIS — J029 Acute pharyngitis, unspecified: Secondary | ICD-10-CM | POA: Diagnosis not present

## 2020-07-12 DIAGNOSIS — E119 Type 2 diabetes mellitus without complications: Secondary | ICD-10-CM | POA: Diagnosis not present

## 2020-07-12 DIAGNOSIS — Z9889 Other specified postprocedural states: Secondary | ICD-10-CM | POA: Diagnosis not present

## 2020-07-12 DIAGNOSIS — F419 Anxiety disorder, unspecified: Secondary | ICD-10-CM | POA: Diagnosis not present

## 2020-07-12 DIAGNOSIS — C50919 Malignant neoplasm of unspecified site of unspecified female breast: Secondary | ICD-10-CM

## 2020-07-12 DIAGNOSIS — C50911 Malignant neoplasm of unspecified site of right female breast: Secondary | ICD-10-CM | POA: Diagnosis not present

## 2020-07-12 DIAGNOSIS — G8929 Other chronic pain: Secondary | ICD-10-CM | POA: Diagnosis not present

## 2020-07-12 DIAGNOSIS — D0511 Intraductal carcinoma in situ of right breast: Secondary | ICD-10-CM | POA: Diagnosis not present

## 2020-07-12 DIAGNOSIS — C773 Secondary and unspecified malignant neoplasm of axilla and upper limb lymph nodes: Secondary | ICD-10-CM | POA: Diagnosis not present

## 2020-07-12 DIAGNOSIS — Z7901 Long term (current) use of anticoagulants: Secondary | ICD-10-CM | POA: Diagnosis not present

## 2020-07-12 DIAGNOSIS — D2262 Melanocytic nevi of left upper limb, including shoulder: Secondary | ICD-10-CM | POA: Diagnosis not present

## 2020-07-12 DIAGNOSIS — I480 Paroxysmal atrial fibrillation: Secondary | ICD-10-CM | POA: Diagnosis not present

## 2020-07-12 DIAGNOSIS — M549 Dorsalgia, unspecified: Secondary | ICD-10-CM | POA: Diagnosis not present

## 2020-07-12 DIAGNOSIS — Z4682 Encounter for fitting and adjustment of non-vascular catheter: Secondary | ICD-10-CM | POA: Diagnosis not present

## 2020-07-12 DIAGNOSIS — I10 Essential (primary) hypertension: Secondary | ICD-10-CM | POA: Diagnosis not present

## 2020-07-12 HISTORY — DX: Malignant neoplasm of unspecified site of unspecified female breast: C50.919

## 2020-07-12 HISTORY — PX: MASTECTOMY: SHX3

## 2020-07-13 DIAGNOSIS — C50911 Malignant neoplasm of unspecified site of right female breast: Secondary | ICD-10-CM | POA: Diagnosis not present

## 2020-07-13 DIAGNOSIS — J029 Acute pharyngitis, unspecified: Secondary | ICD-10-CM | POA: Diagnosis not present

## 2020-07-13 DIAGNOSIS — J45909 Unspecified asthma, uncomplicated: Secondary | ICD-10-CM | POA: Diagnosis not present

## 2020-07-13 DIAGNOSIS — E119 Type 2 diabetes mellitus without complications: Secondary | ICD-10-CM | POA: Diagnosis not present

## 2020-07-13 DIAGNOSIS — F419 Anxiety disorder, unspecified: Secondary | ICD-10-CM | POA: Diagnosis not present

## 2020-07-13 DIAGNOSIS — G8929 Other chronic pain: Secondary | ICD-10-CM | POA: Diagnosis not present

## 2020-07-13 DIAGNOSIS — C7981 Secondary malignant neoplasm of breast: Secondary | ICD-10-CM | POA: Diagnosis not present

## 2020-07-13 DIAGNOSIS — D2262 Melanocytic nevi of left upper limb, including shoulder: Secondary | ICD-10-CM | POA: Diagnosis not present

## 2020-07-13 DIAGNOSIS — M549 Dorsalgia, unspecified: Secondary | ICD-10-CM | POA: Diagnosis not present

## 2020-07-13 DIAGNOSIS — I10 Essential (primary) hypertension: Secondary | ICD-10-CM | POA: Diagnosis not present

## 2020-07-13 DIAGNOSIS — I480 Paroxysmal atrial fibrillation: Secondary | ICD-10-CM | POA: Diagnosis not present

## 2020-07-14 ENCOUNTER — Encounter (HOSPITAL_COMMUNITY): Payer: Medicare PPO

## 2020-07-18 DIAGNOSIS — J4542 Moderate persistent asthma with status asthmaticus: Secondary | ICD-10-CM | POA: Diagnosis not present

## 2020-07-18 DIAGNOSIS — J4550 Severe persistent asthma, uncomplicated: Secondary | ICD-10-CM | POA: Diagnosis not present

## 2020-07-18 DIAGNOSIS — C50911 Malignant neoplasm of unspecified site of right female breast: Secondary | ICD-10-CM | POA: Diagnosis not present

## 2020-07-18 DIAGNOSIS — Z516 Encounter for desensitization to allergens: Secondary | ICD-10-CM | POA: Diagnosis not present

## 2020-07-24 ENCOUNTER — Other Ambulatory Visit: Payer: Self-pay

## 2020-07-24 ENCOUNTER — Encounter (HOSPITAL_COMMUNITY)
Admission: RE | Admit: 2020-07-24 | Discharge: 2020-07-24 | Disposition: A | Discharge status: Home / Self Care | Payer: Medicare PPO | Source: Ambulatory Visit | Attending: Oncology | Admitting: Oncology

## 2020-07-24 DIAGNOSIS — C50811 Malignant neoplasm of overlapping sites of right female breast: Secondary | ICD-10-CM | POA: Insufficient documentation

## 2020-07-24 DIAGNOSIS — E119 Type 2 diabetes mellitus without complications: Secondary | ICD-10-CM | POA: Diagnosis not present

## 2020-07-24 DIAGNOSIS — R911 Solitary pulmonary nodule: Secondary | ICD-10-CM | POA: Diagnosis not present

## 2020-07-24 LAB — GLUCOSE, CAPILLARY: Glucose-Capillary: 108 mg/dL — ABNORMAL HIGH (ref 70–99)

## 2020-07-24 MED ORDER — FLUDEOXYGLUCOSE F - 18 (FDG) INJECTION
10.9000 | Freq: Once | INTRAVENOUS | Status: AC | PRN
  Administered 2020-07-24: 10.9 via INTRAVENOUS

## 2020-08-01 ENCOUNTER — Ambulatory Visit: Payer: Medicare PPO | Admitting: Diagnostic Neuroimaging

## 2020-08-01 DIAGNOSIS — I878 Other specified disorders of veins: Secondary | ICD-10-CM | POA: Diagnosis not present

## 2020-08-01 DIAGNOSIS — C50811 Malignant neoplasm of overlapping sites of right female breast: Secondary | ICD-10-CM | POA: Diagnosis not present

## 2020-08-01 DIAGNOSIS — Z9011 Acquired absence of right breast and nipple: Secondary | ICD-10-CM | POA: Diagnosis not present

## 2020-08-01 DIAGNOSIS — S22000A Wedge compression fracture of unspecified thoracic vertebra, initial encounter for closed fracture: Secondary | ICD-10-CM | POA: Diagnosis not present

## 2020-08-01 DIAGNOSIS — G4733 Obstructive sleep apnea (adult) (pediatric): Secondary | ICD-10-CM | POA: Diagnosis not present

## 2020-08-01 DIAGNOSIS — Z7189 Other specified counseling: Secondary | ICD-10-CM | POA: Diagnosis not present

## 2020-08-07 DIAGNOSIS — C50811 Malignant neoplasm of overlapping sites of right female breast: Secondary | ICD-10-CM | POA: Diagnosis not present

## 2020-08-07 DIAGNOSIS — I517 Cardiomegaly: Secondary | ICD-10-CM | POA: Diagnosis not present

## 2020-08-09 ENCOUNTER — Encounter: Payer: Self-pay | Admitting: Diagnostic Neuroimaging

## 2020-08-09 ENCOUNTER — Ambulatory Visit: Payer: Medicare PPO | Admitting: Diagnostic Neuroimaging

## 2020-08-09 ENCOUNTER — Other Ambulatory Visit: Payer: Self-pay

## 2020-08-09 VITALS — BP 141/86 | HR 102 | Ht 68.0 in | Wt 229.6 lb

## 2020-08-09 DIAGNOSIS — R200 Anesthesia of skin: Secondary | ICD-10-CM | POA: Diagnosis not present

## 2020-08-09 NOTE — Patient Instructions (Signed)
  NUMBNESS IN FEET (likely related to mild diabetic neuropathy; also history of lumbar radiculopathies s/p surgery ~1996) - supportive care; diabetes control

## 2020-08-09 NOTE — Progress Notes (Signed)
[facility]  PATIENT: Dawn Anderson DOB: [birth year]  REFERRING CLINICIAN: Rory Percy, MD HISTORY FROM: patient  REASON FOR VISIT: new consult    HISTORICAL  CHIEF COMPLAINT:  Chief Complaint  Patient presents with  . Paresthesias    Rm 6 New Pt  health care agent- Anne Ng  "began last summer, both feet getting progressively worse"    HISTORY OF PRESENT ILLNESS:   75 year old female with diabetes, here for evaluation of numbness and tingling.  Symptoms started in summer 2021.  Left arm, left greater than right foot numbness and tingling.  Left arm numbness has improved.  She has some tight bandlike sensation around her left foot.  No pain, needles or burning sensations.  She has chronic low back pain issues, had lumbar spine surgery in 1996.  She had MRI of the cervical spine which showed mild degenerative changes at C4-5, C5-6 and C6-7.   REVIEW OF SYSTEMS: Full 14 system review of systems performed and negative with exception of: As per HPI.  ALLERGIES: Allergies  Allergen Reactions  . Albuterol Sulfate Anaphylaxis  . Apple Anaphylaxis  . Asa [Aspirin] Anaphylaxis  . Coconut Oil Itching  . Origanum Oil Anaphylaxis  . Pineapple Anaphylaxis  . Plastibase Itching  . Prunus Persica Anaphylaxis  . Salicylates Anaphylaxis  . Salmeterol Anaphylaxis  . Strawberry (Diagnostic) Anaphylaxis  . Sulfamethizole Itching  . Chocolate Hives  . Clarithromycin Rash  . Penicillins Nausea Only and Rash  . Latex Itching  . Morphine Itching    HOME MEDICATIONS: Outpatient Medications Prior to Visit  Medication Sig Dispense Refill  . B Complex Vitamins (VITAMIN-B COMPLEX PO) Take 1 tablet by mouth daily.    Marland Kitchen LORazepam (ATIVAN) 1 MG tablet Take 1 mg by mouth every 8 (eight) hours. 1/2 in am and 1/2 pm    . losartan-hydrochlorothiazide (HYZAAR) 50-12.5 MG tablet Take 1 tablet by mouth every morning.  2  . METFORMIN HCL PO Take by mouth.    . metroNIDAZOLE  (METROCREAM) 0.75 % cream Apply topically daily. 45 g 10  . MILK THISTLE PO Take by mouth daily.    . montelukast (SINGULAIR) 10 MG tablet Take 10 mg by mouth at bedtime.    . Multiple Vitamin (MULTIVITAMIN) tablet Take 1 tablet by mouth daily.    . NON FORMULARY CPAP MACHINE    . nystatin-triamcinolone (MYCOLOG II) cream Apply 1 application topically 2 (two) times daily. Apply thin layer bid prn 30 g 3  . oxyCODONE (OXY IR/ROXICODONE) 5 MG immediate release tablet Take by mouth.    . SYMBICORT 160-4.5 MCG/ACT inhaler Inhale 2 puffs into the lungs 2 (two) times daily.    Alveda Reasons 20 MG TABS tablet TAKE 1 TABLET BY MOUTH DAILY WITH SUPPER 30 tablet 11  . XOLAIR 150 MG injection Inject 150 mg into the skin every 28 (twenty-eight) days.    [name] HFA 45 MCG/ACT inhaler Inhale 2 puffs into the lungs as needed.     No facility-administered medications prior to visit.    PAST MEDICAL HISTORY: Past Medical History:  Diagnosis Date  . Arthritis   . Asthma   . Atypical mole 03/21/2006   moderate atypia - left calf  . Atypical mole 03/21/2006   moderate atypia - left pec  . Basal cell carcinoma 03/13/1999   mid to upper back  . Breast cancer ([place]) 07/12/2020   right  . Diabetes mellitus without complication ([place])     PAST SURGICAL HISTORY: Past  Surgical History:  Procedure Laterality Date  . ABDOMINAL HYSTERECTOMY    . BACK SURGERY     x2  . BREAST SURGERY Left    lump removed  . CHOLECYSTECTOMY    . COLONOSCOPY N/A 03/22/2015   Procedure: COLONOSCOPY;  Surgeon: Rogene Houston, MD;  [facility];  Service: Endoscopy;  Laterality: N/A;  830 - moved to 8/17 @ 7:30 - Ann to notify pt  . MASTECTOMY Right 07/12/2020  . NASAL SINUS SURGERY    . SHOULDER ARTHROSCOPY W/ ROTATOR CUFF REPAIR Right     FAMILY HISTORY: Family History  Problem Relation Age of Onset  . Alzheimer's disease Mother   . Kidney disease Father   . Heart disease Father   . Ehlers-Danlos syndrome  Son     SOCIAL HISTORY: Social History   Socioeconomic History  . Marital status: Widowed    Spouse name: Not on file  . Number of children: 2  . Years of education: Not on file  . Highest education level: Master's degree (e.g., MA, MS, MEng, MEd, MSW, MBA)  Occupational History    Comment: retired  Tobacco Use  . Smoking status: Never Smoker  . Smokeless tobacco: Never Used  Vaping Use  . Vaping Use: Never used  Substance and Sexual Activity  . Alcohol use: Never  . Drug use: No  . Sexual activity: Not Currently    Birth control/protection: Surgical    Comment: hyst  Other Topics Concern  . Not on file  Social History Narrative   Lives alone   Caffeine- one soda   Social Determinants of Health   Financial Resource Strain: Low Risk   . Difficulty of Paying Living Expenses: Not hard at all  Food Insecurity: No Food Insecurity  . Worried About Charity fundraiser in the Last Year: Never true  . Ran Out of Food in the Last Year: Never true  Transportation Needs: No Transportation Needs  . Lack of Transportation (Medical): No  . Lack of Transportation (Non-Medical): No  Physical Activity: Insufficiently Active  . Days of Exercise per Week: 3 days  . Minutes of Exercise per Session: 40 min  Stress: No Stress Concern Present  . Feeling of Stress : Only a little  Social Connections: Moderately Isolated  . Frequency of Communication with Friends and Family: More than three times a week  . Frequency of Social Gatherings with Friends and Family: Twice a week  . Attends Religious Services: Never  . Active Member of Clubs or Organizations: Yes  . Attends Archivist Meetings: Not on file  . Marital Status: Widowed  Intimate Partner Violence: Not At Risk  . Fear of Current or Ex-Partner: No  . Emotionally Abused: No  . Physically Abused: No  . Sexually Abused: No     PHYSICAL EXAM  GENERAL EXAM/CONSTITUTIONAL: Vitals:  Vitals:   08/09/20 1015  BP: (!)  141/86  Pulse: (!) 102  Weight: 229 lb 9.6 oz (104.1 kg)  Height: 5\' 8"  (1.727 m)   Body mass index is 34.91 kg/m. Wt Readings from Last 3 Encounters:  08/09/20 229 lb 9.6 oz (104.1 kg)  07/06/18 220 lb (99.8 kg)  07/12/16 200 lb (90.7 kg)    Patient is in no distress; well developed, nourished and groomed; neck is supple  CARDIOVASCULAR:  Examination of carotid arteries is normal; no carotid bruits  Regular rate and rhythm, no murmurs  Examination of peripheral vascular system by observation and palpation is  normal  EYES:  Ophthalmoscopic exam of optic discs and posterior segments is normal; no papilledema or hemorrhages No exam data present  MUSCULOSKELETAL:  Gait, strength, tone, movements noted in Neurologic exam below  NEUROLOGIC: MENTAL STATUS:  No flowsheet data found.  awake, alert, oriented to person, place and time  recent and remote memory intact  normal attention and concentration  language fluent, comprehension intact, naming intact  fund of knowledge appropriate  CRANIAL NERVE:   2nd - no papilledema on fundoscopic exam  2nd, 3rd, 4th, 6th - pupils equal and reactive to light, visual fields full to confrontation, extraocular muscles intact, no nystagmus  5th - facial sensation symmetric  7th - facial strength symmetric  8th - hearing intact  9th - palate elevates symmetrically, uvula midline  11th - shoulder shrug symmetric  12th - tongue protrusion midline  MOTOR:   normal bulk and tone, full strength in the BUE, BLE  SENSORY:   normal and symmetric to light touch, temperature, vibration  COORDINATION:   finger-nose-finger, fine finger movements normal  REFLEXES:   deep tendon reflexes TRACE and symmetric  GAIT/STATION:   narrow based gait     DIAGNOSTIC DATA (LABS, IMAGING, TESTING) - I reviewed patient records, labs, notes, testing and imaging myself where available.  No results found for: WBC, HGB, HCT, MCV,  PLT No results found for: NA, K, CL, CO2, GLUCOSE, BUN, CREATININE, CALCIUM, PROT, ALBUMIN, AST, ALT, ALKPHOS, BILITOT, GFRNONAA, GFRAA No results found for: CHOL, HDL, LDLCALC, LDLDIRECT, TRIG, CHOLHDL No results found for: XYBF3O No results found for: VITAMINB12 No results found for: TSH   07/05/19  A1c 7.1 B12 813   04/19/20 MRI cervical spine 1. Small right subarticular disc protrusion and annular fissure at  C5-6 with mild spinal canal and right neural foraminal stenosis.  2. Mild left neural foraminal stenosis at C4-5 and C6-7.      ASSESSMENT AND PLAN  75 y.o. year old female here with:  Dx:  1. Numbness in feet      PLAN:  NUMBNESS IN FEET (since summer 2021; likely related to mild diabetic neuropathy; also history of lumbar radiculopathies s/p surgery ~1996) - supportive care; diabetes control  Return for return to PCP.    Suanne Marker, MD 08/09/2020, 11:19 AM Certified in Neurology, Neurophysiology and Neuroimaging  [facility] [address], [address] [place], [place] [address] [phone]

## 2020-08-10 ENCOUNTER — Other Ambulatory Visit: Payer: Self-pay

## 2020-08-10 ENCOUNTER — Encounter: Payer: Self-pay | Admitting: Cardiology

## 2020-08-10 ENCOUNTER — Ambulatory Visit: Payer: Medicare PPO | Admitting: Cardiology

## 2020-08-10 VITALS — BP 130/90 | HR 100 | Ht 68.0 in | Wt 229.3 lb

## 2020-08-10 DIAGNOSIS — R609 Edema, unspecified: Secondary | ICD-10-CM | POA: Diagnosis not present

## 2020-08-10 DIAGNOSIS — I480 Paroxysmal atrial fibrillation: Secondary | ICD-10-CM | POA: Diagnosis not present

## 2020-08-10 DIAGNOSIS — Z9011 Acquired absence of right breast and nipple: Secondary | ICD-10-CM | POA: Diagnosis not present

## 2020-08-10 DIAGNOSIS — S22000A Wedge compression fracture of unspecified thoracic vertebra, initial encounter for closed fracture: Secondary | ICD-10-CM | POA: Diagnosis not present

## 2020-08-10 DIAGNOSIS — T8189XD Other complications of procedures, not elsewhere classified, subsequent encounter: Secondary | ICD-10-CM | POA: Diagnosis not present

## 2020-08-10 DIAGNOSIS — C50011 Malignant neoplasm of nipple and areola, right female breast: Secondary | ICD-10-CM | POA: Diagnosis not present

## 2020-08-10 DIAGNOSIS — I10 Essential (primary) hypertension: Secondary | ICD-10-CM

## 2020-08-10 DIAGNOSIS — E1165 Type 2 diabetes mellitus with hyperglycemia: Secondary | ICD-10-CM | POA: Diagnosis not present

## 2020-08-10 NOTE — Progress Notes (Signed)
Clinical Summary Ms. Gradilla is a 75 y.o.female last seen by Dr Bronson Ing, this is our first visit together.   1. PAF - no recent palpitations - isolated episode years ago - no signs of bleeding on xarelto.    2. HTN - compliant with meds  3. Multiple allergies   4. History of breast cancer   SocHx: Retired high school world history Pharmacist, hospital. 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. Past Medical History:  Diagnosis Date  . Arthritis   . Asthma   . Atypical mole 03/21/2006   moderate atypia - left calf  . Atypical mole 03/21/2006   moderate atypia - left pec  . Basal cell carcinoma 03/13/1999   mid to upper back  . Breast cancer ([place]) 07/12/2020   right  . Diabetes mellitus without complication ([facility])      Allergies  Allergen Reactions  . Albuterol Sulfate Anaphylaxis  . Apple Anaphylaxis  . Asa [Aspirin] Anaphylaxis  . Coconut Oil Itching  . Origanum Oil Anaphylaxis  . Pineapple Anaphylaxis  . Plastibase Itching  . Prunus Persica Anaphylaxis  . Salicylates Anaphylaxis  . Salmeterol Anaphylaxis  . Strawberry (Diagnostic) Anaphylaxis  . Sulfamethizole Itching  . Chocolate Hives  . Clarithromycin Rash  . Penicillins Nausea Only and Rash  . Latex Itching  . Morphine Itching     Current Outpatient Medications  Medication Sig Dispense Refill  . B Complex Vitamins (VITAMIN-B COMPLEX PO) Take 1 tablet by mouth daily.    Marland Kitchen LORazepam (ATIVAN) 1 MG tablet Take 1 mg by mouth every 8 (eight) hours. 1/2 in am and 1/2 pm    . losartan-hydrochlorothiazide (HYZAAR) 50-12.5 MG tablet Take 1 tablet by mouth every morning.  2  . METFORMIN HCL PO Take by mouth.    . metroNIDAZOLE (METROCREAM) 0.75 % cream Apply topically daily. 45 g 10  . MILK THISTLE PO Take by mouth daily.    . montelukast (SINGULAIR) 10 MG tablet Take 10 mg by mouth at bedtime.    . Multiple Vitamin (MULTIVITAMIN) tablet Take 1 tablet by mouth daily.    . NON FORMULARY CPAP MACHINE     . nystatin-triamcinolone (MYCOLOG II) cream Apply 1 application topically 2 (two) times daily. Apply thin layer bid prn 30 g 3  . oxyCODONE (OXY IR/ROXICODONE) 5 MG immediate release tablet Take by mouth.    . SYMBICORT 160-4.5 MCG/ACT inhaler Inhale 2 puffs into the lungs 2 (two) times daily.    Alveda Reasons 20 MG TABS tablet TAKE 1 TABLET BY MOUTH DAILY WITH SUPPER 30 tablet 11  . XOLAIR 150 MG injection Inject 150 mg into the skin every 28 (twenty-eight) days.    [name] HFA 45 MCG/ACT inhaler Inhale 2 puffs into the lungs as needed.     No current facility-administered medications for this visit.     Past Surgical History:  Procedure Laterality Date  . ABDOMINAL HYSTERECTOMY    . BACK SURGERY     x2  . BREAST SURGERY Left    lump removed  . CHOLECYSTECTOMY    . COLONOSCOPY N/A 03/22/2015   Procedure: COLONOSCOPY;  Surgeon: Rogene Houston, MD;  [facility];  Service: Endoscopy;  Laterality: N/A;  830 - moved to 8/17 @ 7:30 - Ann to notify pt  . MASTECTOMY Right 07/12/2020  . NASAL SINUS SURGERY    . SHOULDER ARTHROSCOPY W/ ROTATOR CUFF REPAIR Right      Allergies  Allergen Reactions  .  Albuterol Sulfate Anaphylaxis  . Apple Anaphylaxis  . Asa [Aspirin] Anaphylaxis  . Coconut Oil Itching  . Origanum Oil Anaphylaxis  . Pineapple Anaphylaxis  . Plastibase Itching  . Prunus Persica Anaphylaxis  . Salicylates Anaphylaxis  . Salmeterol Anaphylaxis  . Strawberry (Diagnostic) Anaphylaxis  . Sulfamethizole Itching  . Chocolate Hives  . Clarithromycin Rash  . Penicillins Nausea Only and Rash  . Latex Itching  . Morphine Itching      Family History  Problem Relation Age of Onset  . Alzheimer's disease Mother   . Kidney disease Father   . Heart disease Father   . Ehlers-Danlos syndrome Son      Social History Ms. Gingras reports that she has never smoked. She has never used smokeless tobacco. Ms. Verley reports no history of alcohol use.   Review  of Systems CONSTITUTIONAL: No weight loss, fever, chills, weakness or fatigue.  HEENT: Eyes: No visual loss, blurred vision, double vision or yellow sclerae.No hearing loss, sneezing, congestion, runny nose or sore throat.  SKIN: No rash or itching.  CARDIOVASCULAR: per hpi RESPIRATORY: No shortness of breath, cough or sputum.  GASTROINTESTINAL: No anorexia, nausea, vomiting or diarrhea. No abdominal pain or blood.  GENITOURINARY: No burning on urination, no polyuria NEUROLOGICAL: No headache, dizziness, syncope, paralysis, ataxia, numbness or tingling in the extremities. No change in bowel or bladder control.  MUSCULOSKELETAL: No muscle, back pain, joint pain or stiffness.  LYMPHATICS: No enlarged nodes. No history of splenectomy.  PSYCHIATRIC: No history of depression or anxiety.  ENDOCRINOLOGIC: No reports of sweating, cold or heat intolerance. No polyuria or polydipsia.  Marland Kitchen   Physical Examination Today's Vitals   08/10/20 1516  BP: 130/90  Pulse: 100  SpO2: 96%  Weight: 229 lb 4.8 oz (104 kg)  Height: 5\' 8"  (1.727 m)   Body mass index is 34.86 kg/m.  Gen: resting comfortably, no acute distress HEENT: no scleral icterus, pupils equal round and reactive, no palptable cervical adenopathy,  CV: RRR, no m/r/g, no jvd Resp: Clear to auscultation bilaterally GI: abdomen is soft, non-tender, non-distended, normal bowel sounds, no hepatosplenomegaly MSK: extremities are warm, no edema.  Skin: warm, no rash Neuro:  no focal deficits Psych: appropriate affect   Diagnostic Studies Jan 2022 echo Summary  1. Technically difficult study.  2. The left ventricle is normal in size with upper normal wall thickness.  3. The left ventricular systolic function is normal, LVEF is visually  estimated at 65-70%.  4. There is grade I diastolic dysfunction (impaired relaxation).  5. The aortic valve is trileaflet with mildly thickened leaflets with normal  excursion.  6. The left  atrium is mildly to moderately dilated in size.  7. The right ventricle is normal in size, with normal systolic function.  8. The right atrium is mildly dilated in size.  9. No strain imaging possible due to overall poor image quality.     Assessment and Plan  1. PAF - no recent symptoms, continue current meds  2. HTN - at goal, continue current meds        Feb 2022, M.D.

## 2020-08-10 NOTE — Patient Instructions (Signed)
Medication Instructions:   Your physician recommends that you continue on your current medications as directed. Please refer to the Current Medication list given to you today.  Labwork:  None  Testing/Procedures:  None  Follow-Up:  Your physician recommends that you schedule a follow-up appointment in: 1 year.  Any Other Special Instructions Will Be Listed Below (If Applicable).  If you need a refill on your cardiac medications before your next appointment, please call your pharmacy. 

## 2020-08-15 DIAGNOSIS — S22000A Wedge compression fracture of unspecified thoracic vertebra, initial encounter for closed fracture: Secondary | ICD-10-CM | POA: Insufficient documentation

## 2020-08-15 DIAGNOSIS — Z9011 Acquired absence of right breast and nipple: Secondary | ICD-10-CM | POA: Insufficient documentation

## 2020-08-15 DIAGNOSIS — F419 Anxiety disorder, unspecified: Secondary | ICD-10-CM | POA: Diagnosis not present

## 2020-08-15 DIAGNOSIS — J4540 Moderate persistent asthma, uncomplicated: Secondary | ICD-10-CM | POA: Diagnosis not present

## 2020-08-15 DIAGNOSIS — E1165 Type 2 diabetes mellitus with hyperglycemia: Secondary | ICD-10-CM | POA: Diagnosis not present

## 2020-08-15 DIAGNOSIS — I10 Essential (primary) hypertension: Secondary | ICD-10-CM | POA: Diagnosis not present

## 2020-08-15 DIAGNOSIS — Z516 Encounter for desensitization to allergens: Secondary | ICD-10-CM | POA: Diagnosis not present

## 2020-08-15 DIAGNOSIS — C50911 Malignant neoplasm of unspecified site of right female breast: Secondary | ICD-10-CM | POA: Diagnosis not present

## 2020-08-23 DIAGNOSIS — M4854XD Collapsed vertebra, not elsewhere classified, thoracic region, subsequent encounter for fracture with routine healing: Secondary | ICD-10-CM | POA: Diagnosis not present

## 2020-08-23 DIAGNOSIS — J4540 Moderate persistent asthma, uncomplicated: Secondary | ICD-10-CM | POA: Diagnosis not present

## 2020-08-23 DIAGNOSIS — G4733 Obstructive sleep apnea (adult) (pediatric): Secondary | ICD-10-CM | POA: Diagnosis not present

## 2020-08-23 DIAGNOSIS — C50911 Malignant neoplasm of unspecified site of right female breast: Secondary | ICD-10-CM | POA: Diagnosis not present

## 2020-08-24 DIAGNOSIS — D126 Benign neoplasm of colon, unspecified: Secondary | ICD-10-CM | POA: Diagnosis not present

## 2020-08-24 DIAGNOSIS — S22000A Wedge compression fracture of unspecified thoracic vertebra, initial encounter for closed fracture: Secondary | ICD-10-CM | POA: Diagnosis not present

## 2020-08-24 DIAGNOSIS — C50011 Malignant neoplasm of nipple and areola, right female breast: Secondary | ICD-10-CM | POA: Diagnosis not present

## 2020-08-24 DIAGNOSIS — R609 Edema, unspecified: Secondary | ICD-10-CM | POA: Diagnosis not present

## 2020-08-24 DIAGNOSIS — T8189XD Other complications of procedures, not elsewhere classified, subsequent encounter: Secondary | ICD-10-CM | POA: Diagnosis not present

## 2020-08-24 DIAGNOSIS — Z9011 Acquired absence of right breast and nipple: Secondary | ICD-10-CM | POA: Diagnosis not present

## 2020-08-29 DIAGNOSIS — C773 Secondary and unspecified malignant neoplasm of axilla and upper limb lymph nodes: Secondary | ICD-10-CM | POA: Diagnosis not present

## 2020-08-29 DIAGNOSIS — C50911 Malignant neoplasm of unspecified site of right female breast: Secondary | ICD-10-CM | POA: Diagnosis not present

## 2020-08-30 ENCOUNTER — Encounter: Payer: Self-pay | Admitting: Physician Assistant

## 2020-08-30 ENCOUNTER — Other Ambulatory Visit: Payer: Self-pay

## 2020-08-30 ENCOUNTER — Ambulatory Visit: Payer: Medicare PPO | Admitting: Physician Assistant

## 2020-08-30 DIAGNOSIS — Z1283 Encounter for screening for malignant neoplasm of skin: Secondary | ICD-10-CM

## 2020-08-30 DIAGNOSIS — L719 Rosacea, unspecified: Secondary | ICD-10-CM | POA: Diagnosis not present

## 2020-08-30 DIAGNOSIS — D225 Melanocytic nevi of trunk: Secondary | ICD-10-CM | POA: Diagnosis not present

## 2020-08-30 DIAGNOSIS — D2272 Melanocytic nevi of left lower limb, including hip: Secondary | ICD-10-CM | POA: Diagnosis not present

## 2020-08-30 DIAGNOSIS — Z85828 Personal history of other malignant neoplasm of skin: Secondary | ICD-10-CM | POA: Diagnosis not present

## 2020-08-30 DIAGNOSIS — D229 Melanocytic nevi, unspecified: Secondary | ICD-10-CM

## 2020-08-31 ENCOUNTER — Ambulatory Visit: Payer: Medicare PPO | Admitting: Physician Assistant

## 2020-08-31 DIAGNOSIS — Z9011 Acquired absence of right breast and nipple: Secondary | ICD-10-CM | POA: Diagnosis not present

## 2020-08-31 DIAGNOSIS — C50011 Malignant neoplasm of nipple and areola, right female breast: Secondary | ICD-10-CM | POA: Diagnosis not present

## 2020-08-31 DIAGNOSIS — C50911 Malignant neoplasm of unspecified site of right female breast: Secondary | ICD-10-CM | POA: Diagnosis not present

## 2020-09-04 DIAGNOSIS — C50811 Malignant neoplasm of overlapping sites of right female breast: Secondary | ICD-10-CM | POA: Diagnosis not present

## 2020-09-06 DIAGNOSIS — S22000A Wedge compression fracture of unspecified thoracic vertebra, initial encounter for closed fracture: Secondary | ICD-10-CM | POA: Diagnosis not present

## 2020-09-06 DIAGNOSIS — Z9010 Acquired absence of unspecified breast and nipple: Secondary | ICD-10-CM | POA: Diagnosis not present

## 2020-09-06 DIAGNOSIS — I878 Other specified disorders of veins: Secondary | ICD-10-CM | POA: Diagnosis not present

## 2020-09-06 DIAGNOSIS — C50011 Malignant neoplasm of nipple and areola, right female breast: Secondary | ICD-10-CM | POA: Diagnosis not present

## 2020-09-06 DIAGNOSIS — Z9011 Acquired absence of right breast and nipple: Secondary | ICD-10-CM | POA: Diagnosis not present

## 2020-09-06 DIAGNOSIS — Z7189 Other specified counseling: Secondary | ICD-10-CM | POA: Diagnosis not present

## 2020-09-06 DIAGNOSIS — N6489 Other specified disorders of breast: Secondary | ICD-10-CM | POA: Diagnosis not present

## 2020-09-11 ENCOUNTER — Telehealth: Payer: Self-pay | Admitting: Cardiology

## 2020-09-11 DIAGNOSIS — G4733 Obstructive sleep apnea (adult) (pediatric): Secondary | ICD-10-CM | POA: Diagnosis not present

## 2020-09-11 DIAGNOSIS — J45909 Unspecified asthma, uncomplicated: Secondary | ICD-10-CM | POA: Diagnosis not present

## 2020-09-11 DIAGNOSIS — E119 Type 2 diabetes mellitus without complications: Secondary | ICD-10-CM | POA: Diagnosis not present

## 2020-09-11 DIAGNOSIS — Z9989 Dependence on other enabling machines and devices: Secondary | ICD-10-CM | POA: Diagnosis not present

## 2020-09-11 DIAGNOSIS — I10 Essential (primary) hypertension: Secondary | ICD-10-CM | POA: Diagnosis not present

## 2020-09-11 DIAGNOSIS — Z01818 Encounter for other preprocedural examination: Secondary | ICD-10-CM | POA: Diagnosis not present

## 2020-09-11 NOTE — Telephone Encounter (Signed)
Pt is scheduled for right mastectomy on 09/15/2020 and wanted to know from Dr Harl Bowie how long she should hold Xarelto prior to surgery

## 2020-09-11 NOTE — Telephone Encounter (Signed)
Patient is having surgery on 09/15/20 and would like to know when to stop taking her Xarelto and when she should start it back. Please call her to let her know.

## 2020-09-11 NOTE — Telephone Encounter (Signed)
Hold starting 2 days prior to surgery, restart the day after surgery   J Dawn Williamson MD

## 2020-09-11 NOTE — Telephone Encounter (Signed)
Pt voiced understanding and appreciative  

## 2020-09-12 DIAGNOSIS — J4542 Moderate persistent asthma with status asthmaticus: Secondary | ICD-10-CM | POA: Diagnosis not present

## 2020-09-12 DIAGNOSIS — J4550 Severe persistent asthma, uncomplicated: Secondary | ICD-10-CM | POA: Diagnosis not present

## 2020-09-13 ENCOUNTER — Encounter: Payer: Self-pay | Admitting: Physician Assistant

## 2020-09-13 DIAGNOSIS — Z01818 Encounter for other preprocedural examination: Secondary | ICD-10-CM | POA: Diagnosis not present

## 2020-09-13 DIAGNOSIS — G4733 Obstructive sleep apnea (adult) (pediatric): Secondary | ICD-10-CM | POA: Diagnosis not present

## 2020-09-13 NOTE — Progress Notes (Signed)
   Follow-Up Visit   Subjective  Dawn Anderson is a 75 y.o. female who presents for the following: Annual Exam (Left breast- new spot).   The following portions of the chart were reviewed this encounter and updated as appropriate:  Tobacco  Allergies  Meds  Problems  Med Hx  Surg Hx  Fam Hx      Objective  Well appearing patient in no apparent distress; mood and affect are within normal limits.  A full examination was performed including scalp, head, eyes, ears, nose, lips, neck, chest, axillae, abdomen, back, buttocks, bilateral upper extremities, bilateral lower extremities, hands, feet, fingers, toes, fingernails, and toenails. All findings within normal limits unless otherwise noted below.  Objective  Left Calf, Left Pec: White scars  Objective  Mid to Upper Back: Scars clear  Objective  Head to Toe: Head to toe skin exam today no signs of atypical moles, melanoma or non mole skin cancer.  Objective  Left Buccal Cheek, Right Malar Cheek: Few papules with diffuse erythema   Assessment & Plan  Atypical mole (2) Left Calf; Left Pec  observe  History of basal cell carcinoma (BCC) Mid to Upper Back  Yearly skin exams  Encounter for screening for malignant neoplasm of skin Head to Toe  Yearly skin check  Rosacea (2) Right Malar Cheek; Left Buccal Cheek  Continue metronidazole cream  Other Related Medications metroNIDAZOLE (METROCREAM) 0.75 % cream    I, Marnette Perkins, PA-C, have reviewed all documentation's for this visit.  The documentation on 09/13/20 for the exam, diagnosis, procedures and orders are all accurate and complete.

## 2020-09-15 DIAGNOSIS — I4891 Unspecified atrial fibrillation: Secondary | ICD-10-CM | POA: Diagnosis not present

## 2020-09-15 DIAGNOSIS — G4733 Obstructive sleep apnea (adult) (pediatric): Secondary | ICD-10-CM | POA: Diagnosis not present

## 2020-09-15 DIAGNOSIS — Z853 Personal history of malignant neoplasm of breast: Secondary | ICD-10-CM | POA: Diagnosis not present

## 2020-09-15 DIAGNOSIS — Z170 Estrogen receptor positive status [ER+]: Secondary | ICD-10-CM | POA: Diagnosis not present

## 2020-09-15 DIAGNOSIS — Z7984 Long term (current) use of oral hypoglycemic drugs: Secondary | ICD-10-CM | POA: Diagnosis not present

## 2020-09-15 DIAGNOSIS — J45909 Unspecified asthma, uncomplicated: Secondary | ICD-10-CM | POA: Diagnosis not present

## 2020-09-15 DIAGNOSIS — L905 Scar conditions and fibrosis of skin: Secondary | ICD-10-CM | POA: Diagnosis not present

## 2020-09-15 DIAGNOSIS — I10 Essential (primary) hypertension: Secondary | ICD-10-CM | POA: Diagnosis not present

## 2020-09-15 DIAGNOSIS — C50911 Malignant neoplasm of unspecified site of right female breast: Secondary | ICD-10-CM | POA: Diagnosis not present

## 2020-09-15 DIAGNOSIS — Z85828 Personal history of other malignant neoplasm of skin: Secondary | ICD-10-CM | POA: Diagnosis not present

## 2020-09-15 DIAGNOSIS — G8918 Other acute postprocedural pain: Secondary | ICD-10-CM | POA: Diagnosis not present

## 2020-09-15 DIAGNOSIS — E1140 Type 2 diabetes mellitus with diabetic neuropathy, unspecified: Secondary | ICD-10-CM | POA: Diagnosis not present

## 2020-09-21 DIAGNOSIS — H1045 Other chronic allergic conjunctivitis: Secondary | ICD-10-CM | POA: Diagnosis not present

## 2020-09-21 DIAGNOSIS — E119 Type 2 diabetes mellitus without complications: Secondary | ICD-10-CM | POA: Diagnosis not present

## 2020-09-21 DIAGNOSIS — H16223 Keratoconjunctivitis sicca, not specified as Sjogren's, bilateral: Secondary | ICD-10-CM | POA: Diagnosis not present

## 2020-09-21 DIAGNOSIS — Z961 Presence of intraocular lens: Secondary | ICD-10-CM | POA: Diagnosis not present

## 2020-09-21 DIAGNOSIS — H02825 Cysts of left lower eyelid: Secondary | ICD-10-CM | POA: Diagnosis not present

## 2020-09-21 DIAGNOSIS — H0102A Squamous blepharitis right eye, upper and lower eyelids: Secondary | ICD-10-CM | POA: Diagnosis not present

## 2020-09-21 DIAGNOSIS — H0102B Squamous blepharitis left eye, upper and lower eyelids: Secondary | ICD-10-CM | POA: Diagnosis not present

## 2020-09-22 ENCOUNTER — Ambulatory Visit: Payer: Medicare PPO | Admitting: Diagnostic Neuroimaging

## 2020-10-03 DIAGNOSIS — C50919 Malignant neoplasm of unspecified site of unspecified female breast: Secondary | ICD-10-CM | POA: Diagnosis not present

## 2020-10-03 DIAGNOSIS — N6489 Other specified disorders of breast: Secondary | ICD-10-CM | POA: Diagnosis not present

## 2020-10-03 DIAGNOSIS — Z9010 Acquired absence of unspecified breast and nipple: Secondary | ICD-10-CM | POA: Diagnosis not present

## 2020-10-03 DIAGNOSIS — C50011 Malignant neoplasm of nipple and areola, right female breast: Secondary | ICD-10-CM | POA: Diagnosis not present

## 2020-10-03 DIAGNOSIS — Z1502 Genetic susceptibility to malignant neoplasm of ovary: Secondary | ICD-10-CM | POA: Diagnosis not present

## 2020-10-03 DIAGNOSIS — S22000A Wedge compression fracture of unspecified thoracic vertebra, initial encounter for closed fracture: Secondary | ICD-10-CM | POA: Diagnosis not present

## 2020-10-03 DIAGNOSIS — Z1589 Genetic susceptibility to other disease: Secondary | ICD-10-CM | POA: Diagnosis not present

## 2020-10-03 DIAGNOSIS — Z7189 Other specified counseling: Secondary | ICD-10-CM | POA: Diagnosis not present

## 2020-10-03 DIAGNOSIS — Z9011 Acquired absence of right breast and nipple: Secondary | ICD-10-CM | POA: Diagnosis not present

## 2020-10-03 DIAGNOSIS — Z1509 Genetic susceptibility to other malignant neoplasm: Secondary | ICD-10-CM | POA: Diagnosis not present

## 2020-10-09 DIAGNOSIS — Z1502 Genetic susceptibility to malignant neoplasm of ovary: Secondary | ICD-10-CM | POA: Diagnosis not present

## 2020-10-09 DIAGNOSIS — Z1509 Genetic susceptibility to other malignant neoplasm: Secondary | ICD-10-CM | POA: Diagnosis not present

## 2020-10-09 DIAGNOSIS — C50011 Malignant neoplasm of nipple and areola, right female breast: Secondary | ICD-10-CM | POA: Diagnosis not present

## 2020-10-09 DIAGNOSIS — C50919 Malignant neoplasm of unspecified site of unspecified female breast: Secondary | ICD-10-CM | POA: Diagnosis not present

## 2020-10-09 DIAGNOSIS — Z1589 Genetic susceptibility to other disease: Secondary | ICD-10-CM | POA: Diagnosis not present

## 2020-10-10 DIAGNOSIS — E1165 Type 2 diabetes mellitus with hyperglycemia: Secondary | ICD-10-CM | POA: Diagnosis not present

## 2020-10-10 DIAGNOSIS — J4550 Severe persistent asthma, uncomplicated: Secondary | ICD-10-CM | POA: Diagnosis not present

## 2020-10-10 DIAGNOSIS — J4542 Moderate persistent asthma with status asthmaticus: Secondary | ICD-10-CM | POA: Diagnosis not present

## 2020-10-10 DIAGNOSIS — Z0001 Encounter for general adult medical examination with abnormal findings: Secondary | ICD-10-CM | POA: Diagnosis not present

## 2020-10-10 DIAGNOSIS — G4730 Sleep apnea, unspecified: Secondary | ICD-10-CM | POA: Diagnosis not present

## 2020-10-10 DIAGNOSIS — H9311 Tinnitus, right ear: Secondary | ICD-10-CM | POA: Diagnosis not present

## 2020-10-10 DIAGNOSIS — I10 Essential (primary) hypertension: Secondary | ICD-10-CM | POA: Diagnosis not present

## 2020-10-10 DIAGNOSIS — M170 Bilateral primary osteoarthritis of knee: Secondary | ICD-10-CM | POA: Diagnosis not present

## 2020-10-10 DIAGNOSIS — Z516 Encounter for desensitization to allergens: Secondary | ICD-10-CM | POA: Diagnosis not present

## 2020-10-10 DIAGNOSIS — C50919 Malignant neoplasm of unspecified site of unspecified female breast: Secondary | ICD-10-CM | POA: Diagnosis not present

## 2020-10-12 DIAGNOSIS — Z1589 Genetic susceptibility to other disease: Secondary | ICD-10-CM | POA: Diagnosis not present

## 2020-10-12 DIAGNOSIS — Z95828 Presence of other vascular implants and grafts: Secondary | ICD-10-CM | POA: Insufficient documentation

## 2020-10-12 DIAGNOSIS — Z1509 Genetic susceptibility to other malignant neoplasm: Secondary | ICD-10-CM | POA: Diagnosis not present

## 2020-10-12 DIAGNOSIS — Z5111 Encounter for antineoplastic chemotherapy: Secondary | ICD-10-CM | POA: Diagnosis not present

## 2020-10-12 DIAGNOSIS — Z1502 Genetic susceptibility to malignant neoplasm of ovary: Secondary | ICD-10-CM | POA: Diagnosis not present

## 2020-10-12 DIAGNOSIS — C50919 Malignant neoplasm of unspecified site of unspecified female breast: Secondary | ICD-10-CM | POA: Diagnosis not present

## 2020-10-13 DIAGNOSIS — Z1589 Genetic susceptibility to other disease: Secondary | ICD-10-CM | POA: Diagnosis not present

## 2020-10-13 DIAGNOSIS — Z1502 Genetic susceptibility to malignant neoplasm of ovary: Secondary | ICD-10-CM | POA: Diagnosis not present

## 2020-10-13 DIAGNOSIS — C50919 Malignant neoplasm of unspecified site of unspecified female breast: Secondary | ICD-10-CM | POA: Diagnosis not present

## 2020-10-13 DIAGNOSIS — Z79899 Other long term (current) drug therapy: Secondary | ICD-10-CM | POA: Diagnosis not present

## 2020-10-13 DIAGNOSIS — Z1509 Genetic susceptibility to other malignant neoplasm: Secondary | ICD-10-CM | POA: Diagnosis not present

## 2020-10-18 DIAGNOSIS — Z95828 Presence of other vascular implants and grafts: Secondary | ICD-10-CM | POA: Diagnosis not present

## 2020-10-18 DIAGNOSIS — Z1589 Genetic susceptibility to other disease: Secondary | ICD-10-CM | POA: Diagnosis not present

## 2020-10-18 DIAGNOSIS — C50919 Malignant neoplasm of unspecified site of unspecified female breast: Secondary | ICD-10-CM | POA: Diagnosis not present

## 2020-10-18 DIAGNOSIS — Z1502 Genetic susceptibility to malignant neoplasm of ovary: Secondary | ICD-10-CM | POA: Diagnosis not present

## 2020-10-18 DIAGNOSIS — Z1509 Genetic susceptibility to other malignant neoplasm: Secondary | ICD-10-CM | POA: Diagnosis not present

## 2020-10-20 DIAGNOSIS — C50919 Malignant neoplasm of unspecified site of unspecified female breast: Secondary | ICD-10-CM | POA: Diagnosis not present

## 2020-10-20 DIAGNOSIS — Z9011 Acquired absence of right breast and nipple: Secondary | ICD-10-CM | POA: Diagnosis not present

## 2020-10-20 DIAGNOSIS — T8149XA Infection following a procedure, other surgical site, initial encounter: Secondary | ICD-10-CM | POA: Diagnosis not present

## 2020-10-20 DIAGNOSIS — Z1502 Genetic susceptibility to malignant neoplasm of ovary: Secondary | ICD-10-CM | POA: Diagnosis not present

## 2020-10-20 DIAGNOSIS — Z09 Encounter for follow-up examination after completed treatment for conditions other than malignant neoplasm: Secondary | ICD-10-CM | POA: Diagnosis not present

## 2020-10-20 DIAGNOSIS — K521 Toxic gastroenteritis and colitis: Secondary | ICD-10-CM | POA: Diagnosis not present

## 2020-10-20 DIAGNOSIS — N6489 Other specified disorders of breast: Secondary | ICD-10-CM | POA: Diagnosis not present

## 2020-10-20 DIAGNOSIS — E1165 Type 2 diabetes mellitus with hyperglycemia: Secondary | ICD-10-CM | POA: Diagnosis not present

## 2020-10-20 DIAGNOSIS — Z7189 Other specified counseling: Secondary | ICD-10-CM | POA: Diagnosis not present

## 2020-10-20 DIAGNOSIS — Z1509 Genetic susceptibility to other malignant neoplasm: Secondary | ICD-10-CM | POA: Diagnosis not present

## 2020-10-20 DIAGNOSIS — Z1589 Genetic susceptibility to other disease: Secondary | ICD-10-CM | POA: Diagnosis not present

## 2020-10-24 DIAGNOSIS — L658 Other specified nonscarring hair loss: Secondary | ICD-10-CM | POA: Diagnosis not present

## 2020-10-24 DIAGNOSIS — T451X5A Adverse effect of antineoplastic and immunosuppressive drugs, initial encounter: Secondary | ICD-10-CM | POA: Diagnosis not present

## 2020-10-24 DIAGNOSIS — Z1502 Genetic susceptibility to malignant neoplasm of ovary: Secondary | ICD-10-CM | POA: Diagnosis not present

## 2020-10-24 DIAGNOSIS — C50919 Malignant neoplasm of unspecified site of unspecified female breast: Secondary | ICD-10-CM | POA: Diagnosis not present

## 2020-10-24 DIAGNOSIS — K521 Toxic gastroenteritis and colitis: Secondary | ICD-10-CM | POA: Diagnosis not present

## 2020-10-24 DIAGNOSIS — R21 Rash and other nonspecific skin eruption: Secondary | ICD-10-CM | POA: Diagnosis not present

## 2020-10-24 DIAGNOSIS — N6489 Other specified disorders of breast: Secondary | ICD-10-CM | POA: Diagnosis not present

## 2020-10-24 DIAGNOSIS — Z7189 Other specified counseling: Secondary | ICD-10-CM | POA: Diagnosis not present

## 2020-10-24 DIAGNOSIS — Z09 Encounter for follow-up examination after completed treatment for conditions other than malignant neoplasm: Secondary | ICD-10-CM | POA: Diagnosis not present

## 2020-10-31 DIAGNOSIS — C50011 Malignant neoplasm of nipple and areola, right female breast: Secondary | ICD-10-CM | POA: Diagnosis not present

## 2020-10-31 DIAGNOSIS — R21 Rash and other nonspecific skin eruption: Secondary | ICD-10-CM | POA: Diagnosis not present

## 2020-10-31 DIAGNOSIS — Z1509 Genetic susceptibility to other malignant neoplasm: Secondary | ICD-10-CM | POA: Diagnosis not present

## 2020-10-31 DIAGNOSIS — Z09 Encounter for follow-up examination after completed treatment for conditions other than malignant neoplasm: Secondary | ICD-10-CM | POA: Diagnosis not present

## 2020-10-31 DIAGNOSIS — T451X5A Adverse effect of antineoplastic and immunosuppressive drugs, initial encounter: Secondary | ICD-10-CM | POA: Diagnosis not present

## 2020-10-31 DIAGNOSIS — L658 Other specified nonscarring hair loss: Secondary | ICD-10-CM | POA: Insufficient documentation

## 2020-10-31 DIAGNOSIS — Z1502 Genetic susceptibility to malignant neoplasm of ovary: Secondary | ICD-10-CM | POA: Diagnosis not present

## 2020-10-31 DIAGNOSIS — Z9011 Acquired absence of right breast and nipple: Secondary | ICD-10-CM | POA: Diagnosis not present

## 2020-10-31 DIAGNOSIS — K521 Toxic gastroenteritis and colitis: Secondary | ICD-10-CM | POA: Insufficient documentation

## 2020-10-31 DIAGNOSIS — Z1589 Genetic susceptibility to other disease: Secondary | ICD-10-CM | POA: Diagnosis not present

## 2020-10-31 DIAGNOSIS — C50919 Malignant neoplasm of unspecified site of unspecified female breast: Secondary | ICD-10-CM | POA: Diagnosis not present

## 2020-11-02 ENCOUNTER — Other Ambulatory Visit: Payer: Self-pay | Admitting: Cardiology

## 2020-11-02 ENCOUNTER — Telehealth: Payer: Self-pay | Admitting: *Deleted

## 2020-11-02 DIAGNOSIS — Z1589 Genetic susceptibility to other disease: Secondary | ICD-10-CM | POA: Diagnosis not present

## 2020-11-02 DIAGNOSIS — Z1509 Genetic susceptibility to other malignant neoplasm: Secondary | ICD-10-CM | POA: Diagnosis not present

## 2020-11-02 DIAGNOSIS — Z5111 Encounter for antineoplastic chemotherapy: Secondary | ICD-10-CM | POA: Diagnosis not present

## 2020-11-02 DIAGNOSIS — C50919 Malignant neoplasm of unspecified site of unspecified female breast: Secondary | ICD-10-CM | POA: Diagnosis not present

## 2020-11-02 DIAGNOSIS — Z1502 Genetic susceptibility to malignant neoplasm of ovary: Secondary | ICD-10-CM | POA: Diagnosis not present

## 2020-11-02 DIAGNOSIS — Z95828 Presence of other vascular implants and grafts: Secondary | ICD-10-CM | POA: Diagnosis not present

## 2020-11-02 DIAGNOSIS — Z5112 Encounter for antineoplastic immunotherapy: Secondary | ICD-10-CM | POA: Diagnosis not present

## 2020-11-02 NOTE — Telephone Encounter (Signed)
Prescription refill request for Xarelto received.  Indication:  PAF Last office visit: 08/10/20 Weight: 104kg Age: 75 Scr: 0.72 CrCl: 112.55  Based on above findings Xarelto 20mg  daily is the appropriate dose.  Refill approved.

## 2020-11-03 DIAGNOSIS — Z1509 Genetic susceptibility to other malignant neoplasm: Secondary | ICD-10-CM | POA: Diagnosis not present

## 2020-11-03 DIAGNOSIS — Z1502 Genetic susceptibility to malignant neoplasm of ovary: Secondary | ICD-10-CM | POA: Diagnosis not present

## 2020-11-03 DIAGNOSIS — Z1589 Genetic susceptibility to other disease: Secondary | ICD-10-CM | POA: Diagnosis not present

## 2020-11-03 DIAGNOSIS — C50919 Malignant neoplasm of unspecified site of unspecified female breast: Secondary | ICD-10-CM | POA: Diagnosis not present

## 2020-11-10 DIAGNOSIS — J4550 Severe persistent asthma, uncomplicated: Secondary | ICD-10-CM | POA: Diagnosis not present

## 2020-11-10 DIAGNOSIS — Z1509 Genetic susceptibility to other malignant neoplasm: Secondary | ICD-10-CM | POA: Diagnosis not present

## 2020-11-10 DIAGNOSIS — Z1502 Genetic susceptibility to malignant neoplasm of ovary: Secondary | ICD-10-CM | POA: Diagnosis not present

## 2020-11-10 DIAGNOSIS — C50919 Malignant neoplasm of unspecified site of unspecified female breast: Secondary | ICD-10-CM | POA: Diagnosis not present

## 2020-11-10 DIAGNOSIS — Z516 Encounter for desensitization to allergens: Secondary | ICD-10-CM | POA: Diagnosis not present

## 2020-11-10 DIAGNOSIS — J4542 Moderate persistent asthma with status asthmaticus: Secondary | ICD-10-CM | POA: Diagnosis not present

## 2020-11-10 DIAGNOSIS — Z95828 Presence of other vascular implants and grafts: Secondary | ICD-10-CM | POA: Diagnosis not present

## 2020-11-10 DIAGNOSIS — Z1589 Genetic susceptibility to other disease: Secondary | ICD-10-CM | POA: Diagnosis not present

## 2020-11-16 ENCOUNTER — Telehealth (INDEPENDENT_AMBULATORY_CARE_PROVIDER_SITE_OTHER): Payer: Medicare PPO | Admitting: Dermatology

## 2020-11-16 ENCOUNTER — Other Ambulatory Visit: Payer: Self-pay

## 2020-11-16 ENCOUNTER — Ambulatory Visit: Payer: Medicare PPO | Admitting: Dermatology

## 2020-11-16 ENCOUNTER — Encounter: Payer: Self-pay | Admitting: Dermatology

## 2020-11-16 DIAGNOSIS — D485 Neoplasm of uncertain behavior of skin: Secondary | ICD-10-CM

## 2020-11-16 DIAGNOSIS — L739 Follicular disorder, unspecified: Secondary | ICD-10-CM | POA: Diagnosis not present

## 2020-11-16 DIAGNOSIS — L080 Pyoderma: Secondary | ICD-10-CM | POA: Diagnosis not present

## 2020-11-16 LAB — CBC WITH DIFFERENTIAL/PLATELET
Absolute Monocytes: 701 cells/uL (ref 200–950)
Basophils Absolute: 163 cells/uL (ref 0–200)
Basophils Relative: 1.7 %
Eosinophils Absolute: 10 cells/uL — ABNORMAL LOW (ref 15–500)
Eosinophils Relative: 0.1 %
HCT: 39.9 % (ref 35.0–45.0)
Hemoglobin: 12.7 g/dL (ref 11.7–15.5)
Lymphs Abs: 1882 cells/uL (ref 850–3900)
MCH: 27.5 pg (ref 27.0–33.0)
MCHC: 31.8 g/dL — ABNORMAL LOW (ref 32.0–36.0)
MCV: 86.6 fL (ref 80.0–100.0)
MPV: 8.9 fL (ref 7.5–12.5)
Monocytes Relative: 7.3 %
Neutro Abs: 6845 cells/uL (ref 1500–7800)
Neutrophils Relative %: 71.3 %
Platelets: 226 10*3/uL (ref 140–400)
RBC: 4.61 10*6/uL (ref 3.80–5.10)
RDW: 15.1 % — ABNORMAL HIGH (ref 11.0–15.0)
Total Lymphocyte: 19.6 %
WBC: 9.6 10*3/uL (ref 3.8–10.8)

## 2020-11-16 NOTE — Patient Instructions (Signed)

## 2020-11-16 NOTE — Telephone Encounter (Signed)
She called and we discussed her essentially normal CBC, most critically that she has adequate white cells.  She fully understood that this is a positive thing indicating that if there were any skin infection that she plus the antibiotic would almost certainly be able take care of it.  We will await the culture result which I expect will be negative and the biopsy report.  Dawn Anderson said she would give the office a call on Tuesday.

## 2020-11-20 DIAGNOSIS — Z780 Asymptomatic menopausal state: Secondary | ICD-10-CM | POA: Diagnosis not present

## 2020-11-20 DIAGNOSIS — M8580 Other specified disorders of bone density and structure, unspecified site: Secondary | ICD-10-CM | POA: Diagnosis not present

## 2020-11-20 DIAGNOSIS — Z9889 Other specified postprocedural states: Secondary | ICD-10-CM | POA: Diagnosis not present

## 2020-11-20 DIAGNOSIS — Z7951 Long term (current) use of inhaled steroids: Secondary | ICD-10-CM | POA: Diagnosis not present

## 2020-11-20 DIAGNOSIS — Q796 Ehlers-Danlos syndrome, unspecified: Secondary | ICD-10-CM | POA: Diagnosis not present

## 2020-11-20 DIAGNOSIS — R29890 Loss of height: Secondary | ICD-10-CM | POA: Diagnosis not present

## 2020-11-20 DIAGNOSIS — M85852 Other specified disorders of bone density and structure, left thigh: Secondary | ICD-10-CM | POA: Diagnosis not present

## 2020-11-20 DIAGNOSIS — Z7901 Long term (current) use of anticoagulants: Secondary | ICD-10-CM | POA: Diagnosis not present

## 2020-11-20 DIAGNOSIS — I4891 Unspecified atrial fibrillation: Secondary | ICD-10-CM | POA: Diagnosis not present

## 2020-11-20 DIAGNOSIS — E119 Type 2 diabetes mellitus without complications: Secondary | ICD-10-CM | POA: Diagnosis not present

## 2020-11-20 DIAGNOSIS — M47816 Spondylosis without myelopathy or radiculopathy, lumbar region: Secondary | ICD-10-CM | POA: Diagnosis not present

## 2020-11-21 ENCOUNTER — Telehealth: Payer: Self-pay | Admitting: Dermatology

## 2020-11-21 DIAGNOSIS — Z1509 Genetic susceptibility to other malignant neoplasm: Secondary | ICD-10-CM | POA: Diagnosis not present

## 2020-11-21 DIAGNOSIS — R21 Rash and other nonspecific skin eruption: Secondary | ICD-10-CM | POA: Diagnosis not present

## 2020-11-21 DIAGNOSIS — Z1589 Genetic susceptibility to other disease: Secondary | ICD-10-CM | POA: Diagnosis not present

## 2020-11-21 DIAGNOSIS — C50919 Malignant neoplasm of unspecified site of unspecified female breast: Secondary | ICD-10-CM | POA: Diagnosis not present

## 2020-11-21 DIAGNOSIS — Z09 Encounter for follow-up examination after completed treatment for conditions other than malignant neoplasm: Secondary | ICD-10-CM | POA: Diagnosis not present

## 2020-11-21 DIAGNOSIS — Z1502 Genetic susceptibility to malignant neoplasm of ovary: Secondary | ICD-10-CM | POA: Diagnosis not present

## 2020-11-21 NOTE — Telephone Encounter (Signed)
Patient is calling for pathology results from last visit with Lavonna Monarch, MD.  Patient will be at another appointment today until about 12:00.

## 2020-11-21 NOTE — Telephone Encounter (Signed)
Dr Delma Officer advice to patient- patient is going to hold off on everything until bacterial results are back. Told her we would call her once those results are back in.

## 2020-11-21 NOTE — Telephone Encounter (Signed)
Please try to reach this lovely lady.  Her biopsy shows acute plus chronic folliculitis which is a nonspecific pattern.  I discussed with her by phone that her CBC shows that she has  adequate white blood cells.  Culture is still pending and may be difficult to interpret because she had been on doxycycline.  If the pustules persist, the main therapeutic option if the culture is negative would be a pulse of oral prednisone which Litha and I briefly discussed at the time of her visit and neither of [place] was enthusiastic about trying this.  I would need the okay of her oncology team before prescribing this.

## 2020-11-22 LAB — ANAEROBIC AND AEROBIC CULTURE
MICRO NUMBER:: 11771089
MICRO NUMBER:: 11771090
SPECIMEN QUALITY:: ADEQUATE
SPECIMEN QUALITY:: ADEQUATE

## 2020-11-23 DIAGNOSIS — Z95828 Presence of other vascular implants and grafts: Secondary | ICD-10-CM | POA: Diagnosis not present

## 2020-11-23 DIAGNOSIS — Z1509 Genetic susceptibility to other malignant neoplasm: Secondary | ICD-10-CM | POA: Diagnosis not present

## 2020-11-23 DIAGNOSIS — Z5111 Encounter for antineoplastic chemotherapy: Secondary | ICD-10-CM | POA: Diagnosis not present

## 2020-11-23 DIAGNOSIS — C50919 Malignant neoplasm of unspecified site of unspecified female breast: Secondary | ICD-10-CM | POA: Diagnosis not present

## 2020-11-23 DIAGNOSIS — Z1589 Genetic susceptibility to other disease: Secondary | ICD-10-CM | POA: Diagnosis not present

## 2020-11-23 DIAGNOSIS — Z1502 Genetic susceptibility to malignant neoplasm of ovary: Secondary | ICD-10-CM | POA: Diagnosis not present

## 2020-11-24 DIAGNOSIS — Z1509 Genetic susceptibility to other malignant neoplasm: Secondary | ICD-10-CM | POA: Diagnosis not present

## 2020-11-24 DIAGNOSIS — Z79899 Other long term (current) drug therapy: Secondary | ICD-10-CM | POA: Diagnosis not present

## 2020-11-24 DIAGNOSIS — C50919 Malignant neoplasm of unspecified site of unspecified female breast: Secondary | ICD-10-CM | POA: Diagnosis not present

## 2020-11-24 DIAGNOSIS — Z1589 Genetic susceptibility to other disease: Secondary | ICD-10-CM | POA: Diagnosis not present

## 2020-11-24 DIAGNOSIS — Z1502 Genetic susceptibility to malignant neoplasm of ovary: Secondary | ICD-10-CM | POA: Diagnosis not present

## 2020-11-26 ENCOUNTER — Encounter: Payer: Self-pay | Admitting: Dermatology

## 2020-11-26 NOTE — Progress Notes (Signed)
   Follow-Up Visit   Subjective  Dawn Anderson is a 75 y.o. female who presents for the following: Skin Problem (Multiple lesions on scalp, red blisters that scab, per patient feels like needles are sticking all in her head. /Patient has had two infusions for breast cancer, hair began falling out and then just became progressively worse x weeks. Patient has been using hydrocortisone cream which has not helped at all.).  Painful rash on scalp Location:  Duration:  Quality:  Associated Signs/Symptoms: Modifying Factors:  Severity:  Timing: Context:   Objective  Well appearing patient in no apparent distress; mood and affect are within normal limits. Objective  Scalp: Patient already been on doxycycline but does not believe a culture was taken.  Examination showed literally 130 follicular pustules and crust most severe over the crown and occiput.  All of the top and posterior scalp is tender to touch.  No adenopathy.  No other areas involved.  Differential diagnosis would include both infectious and noninfectious folliculitis along with a drug-induced paraneoplastic disorder like neutrophilic eccrine hidradenitis.  Images    Objective  Right Occipital Scalp: Will obtain deep punch biopsy of 1 intact pustule.    A focused examination was performed including Head, neck, lymph glands, mucosal.. Relevant physical exam findings are noted in the Assessment and Plan.   Assessment & Plan    Folliculitis Scalp  We will obtain stat CBC along with culture and biopsies.  Continue doxycycline and defer adding medication pending lab results.  Other Related Procedures Anaerobic and Aerobic Culture CBC with Differential/Platelets  Neoplasm of uncertain behavior of skin Right Occipital Scalp  Skin / nail biopsy Type of biopsy: punch   Informed consent: discussed and consent obtained   Timeout: patient name, date of birth, surgical site, and procedure verified   Procedure prep:   Patient was prepped and draped in usual sterile fashion (Non sterile) Prep type:  Chlorhexidine Anesthesia: the lesion was anesthetized in a standard fashion   Anesthetic:  1% lidocaine w/ epinephrine 1-100,000 local infiltration Punch size:  3 mm Suture size:  4-0 Suture type: nylon   Suture removal (days):  9 Hemostasis achieved with: suture   Outcome: patient tolerated procedure well   Post-procedure details: sterile dressing applied and wound care instructions given   Dressing type: petrolatum and bandage   Additional details:  4-0 Ethilon  1  Specimen 1 - Surgical pathology Differential Diagnosis: folliculitis ? Eccrine neurophilic secondary to chemotherapy Check Margins: No      I, Lavonna Monarch, MD, have reviewed all documentation for this visit.  The documentation on 11/26/20 for the exam, diagnosis, procedures, and orders are all accurate and complete.

## 2020-11-27 ENCOUNTER — Telehealth: Payer: Self-pay | Admitting: *Deleted

## 2020-11-27 DIAGNOSIS — Z1509 Genetic susceptibility to other malignant neoplasm: Secondary | ICD-10-CM | POA: Diagnosis not present

## 2020-11-27 DIAGNOSIS — Z1589 Genetic susceptibility to other disease: Secondary | ICD-10-CM | POA: Diagnosis not present

## 2020-11-27 DIAGNOSIS — Z95828 Presence of other vascular implants and grafts: Secondary | ICD-10-CM | POA: Diagnosis not present

## 2020-11-27 DIAGNOSIS — Z1502 Genetic susceptibility to malignant neoplasm of ovary: Secondary | ICD-10-CM | POA: Diagnosis not present

## 2020-11-27 DIAGNOSIS — C50919 Malignant neoplasm of unspecified site of unspecified female breast: Secondary | ICD-10-CM | POA: Diagnosis not present

## 2020-11-27 NOTE — Telephone Encounter (Signed)
-----   Message from Lavonna Monarch, MD sent at 11/23/2020  6:36 AM EDT ----- Regarding: culture result 2 messages on this lab report: (#1) lets make sure we include the source for future cultures; this one was obtained from the posterior scalp.  (#2) please inform Dawn Anderson of her negative culture.  There is possibility of a false negative since she was on antibiotics, but Dr. Darene Lamer does feel that this is likely a noninfectious process.  The doxycycline may actually help even if the cause is not an infection.  If there is some improvement I would lower the dose of the doxycycline to 50 mg twice daily and continue with this for 4 weeks.  If there is no improvement then she has the option of trying a 2-week taper of oral prednisone, but I would want this to first be approved by her [facility] oncology team. ----- Message ----- From: Cheyenne Adas Lab Results In Sent: 11/16/2020   1:08 PM EDT To: Lavonna Monarch, MD

## 2020-11-27 NOTE — Telephone Encounter (Signed)
Culture results to patient- patient states she is doing a lot better and no further action is necessary at this time.  Told to call back if needed.

## 2020-12-07 DIAGNOSIS — R5383 Other fatigue: Secondary | ICD-10-CM | POA: Diagnosis not present

## 2020-12-07 DIAGNOSIS — J4550 Severe persistent asthma, uncomplicated: Secondary | ICD-10-CM | POA: Diagnosis not present

## 2020-12-07 DIAGNOSIS — I10 Essential (primary) hypertension: Secondary | ICD-10-CM | POA: Diagnosis not present

## 2020-12-07 DIAGNOSIS — J4542 Moderate persistent asthma with status asthmaticus: Secondary | ICD-10-CM | POA: Diagnosis not present

## 2020-12-12 DIAGNOSIS — M9903 Segmental and somatic dysfunction of lumbar region: Secondary | ICD-10-CM | POA: Diagnosis not present

## 2020-12-12 DIAGNOSIS — M47816 Spondylosis without myelopathy or radiculopathy, lumbar region: Secondary | ICD-10-CM | POA: Diagnosis not present

## 2020-12-12 DIAGNOSIS — S233XXA Sprain of ligaments of thoracic spine, initial encounter: Secondary | ICD-10-CM | POA: Diagnosis not present

## 2020-12-12 DIAGNOSIS — M9901 Segmental and somatic dysfunction of cervical region: Secondary | ICD-10-CM | POA: Diagnosis not present

## 2020-12-12 DIAGNOSIS — M9902 Segmental and somatic dysfunction of thoracic region: Secondary | ICD-10-CM | POA: Diagnosis not present

## 2020-12-12 DIAGNOSIS — M47812 Spondylosis without myelopathy or radiculopathy, cervical region: Secondary | ICD-10-CM | POA: Diagnosis not present

## 2020-12-13 DIAGNOSIS — N6489 Other specified disorders of breast: Secondary | ICD-10-CM | POA: Diagnosis not present

## 2020-12-13 DIAGNOSIS — L658 Other specified nonscarring hair loss: Secondary | ICD-10-CM | POA: Diagnosis not present

## 2020-12-13 DIAGNOSIS — R21 Rash and other nonspecific skin eruption: Secondary | ICD-10-CM | POA: Diagnosis not present

## 2020-12-13 DIAGNOSIS — Z7189 Other specified counseling: Secondary | ICD-10-CM | POA: Diagnosis not present

## 2020-12-13 DIAGNOSIS — I480 Paroxysmal atrial fibrillation: Secondary | ICD-10-CM | POA: Diagnosis not present

## 2020-12-13 DIAGNOSIS — Z1589 Genetic susceptibility to other disease: Secondary | ICD-10-CM | POA: Diagnosis not present

## 2020-12-13 DIAGNOSIS — Z09 Encounter for follow-up examination after completed treatment for conditions other than malignant neoplasm: Secondary | ICD-10-CM | POA: Diagnosis not present

## 2020-12-13 DIAGNOSIS — Z1509 Genetic susceptibility to other malignant neoplasm: Secondary | ICD-10-CM | POA: Diagnosis not present

## 2020-12-13 DIAGNOSIS — Z9011 Acquired absence of right breast and nipple: Secondary | ICD-10-CM | POA: Diagnosis not present

## 2020-12-13 DIAGNOSIS — Z1502 Genetic susceptibility to malignant neoplasm of ovary: Secondary | ICD-10-CM | POA: Diagnosis not present

## 2020-12-13 DIAGNOSIS — C50919 Malignant neoplasm of unspecified site of unspecified female breast: Secondary | ICD-10-CM | POA: Diagnosis not present

## 2020-12-14 DIAGNOSIS — Z1589 Genetic susceptibility to other disease: Secondary | ICD-10-CM | POA: Diagnosis not present

## 2020-12-14 DIAGNOSIS — Z1509 Genetic susceptibility to other malignant neoplasm: Secondary | ICD-10-CM | POA: Diagnosis not present

## 2020-12-14 DIAGNOSIS — Z5111 Encounter for antineoplastic chemotherapy: Secondary | ICD-10-CM | POA: Diagnosis not present

## 2020-12-14 DIAGNOSIS — Z1502 Genetic susceptibility to malignant neoplasm of ovary: Secondary | ICD-10-CM | POA: Diagnosis not present

## 2020-12-14 DIAGNOSIS — C50919 Malignant neoplasm of unspecified site of unspecified female breast: Secondary | ICD-10-CM | POA: Diagnosis not present

## 2020-12-15 DIAGNOSIS — Z1589 Genetic susceptibility to other disease: Secondary | ICD-10-CM | POA: Diagnosis not present

## 2020-12-15 DIAGNOSIS — Z79899 Other long term (current) drug therapy: Secondary | ICD-10-CM | POA: Diagnosis not present

## 2020-12-15 DIAGNOSIS — Z1502 Genetic susceptibility to malignant neoplasm of ovary: Secondary | ICD-10-CM | POA: Diagnosis not present

## 2020-12-15 DIAGNOSIS — Z1509 Genetic susceptibility to other malignant neoplasm: Secondary | ICD-10-CM | POA: Diagnosis not present

## 2020-12-15 DIAGNOSIS — C50919 Malignant neoplasm of unspecified site of unspecified female breast: Secondary | ICD-10-CM | POA: Diagnosis not present

## 2020-12-18 DIAGNOSIS — I10 Essential (primary) hypertension: Secondary | ICD-10-CM | POA: Diagnosis not present

## 2020-12-26 DIAGNOSIS — Z1502 Genetic susceptibility to malignant neoplasm of ovary: Secondary | ICD-10-CM | POA: Diagnosis not present

## 2020-12-26 DIAGNOSIS — Z1589 Genetic susceptibility to other disease: Secondary | ICD-10-CM | POA: Diagnosis not present

## 2020-12-26 DIAGNOSIS — Z1509 Genetic susceptibility to other malignant neoplasm: Secondary | ICD-10-CM | POA: Diagnosis not present

## 2020-12-26 DIAGNOSIS — C50919 Malignant neoplasm of unspecified site of unspecified female breast: Secondary | ICD-10-CM | POA: Diagnosis not present

## 2020-12-26 DIAGNOSIS — Z7901 Long term (current) use of anticoagulants: Secondary | ICD-10-CM | POA: Diagnosis not present

## 2020-12-26 DIAGNOSIS — Z09 Encounter for follow-up examination after completed treatment for conditions other than malignant neoplasm: Secondary | ICD-10-CM | POA: Diagnosis not present

## 2021-01-02 DIAGNOSIS — J4550 Severe persistent asthma, uncomplicated: Secondary | ICD-10-CM | POA: Diagnosis not present

## 2021-01-02 DIAGNOSIS — J4542 Moderate persistent asthma with status asthmaticus: Secondary | ICD-10-CM | POA: Diagnosis not present

## 2021-01-03 DIAGNOSIS — C50919 Malignant neoplasm of unspecified site of unspecified female breast: Secondary | ICD-10-CM | POA: Diagnosis not present

## 2021-01-03 DIAGNOSIS — Z1589 Genetic susceptibility to other disease: Secondary | ICD-10-CM | POA: Diagnosis not present

## 2021-01-03 DIAGNOSIS — Z1509 Genetic susceptibility to other malignant neoplasm: Secondary | ICD-10-CM | POA: Diagnosis not present

## 2021-01-03 DIAGNOSIS — Z09 Encounter for follow-up examination after completed treatment for conditions other than malignant neoplasm: Secondary | ICD-10-CM | POA: Diagnosis not present

## 2021-01-03 DIAGNOSIS — Z1502 Genetic susceptibility to malignant neoplasm of ovary: Secondary | ICD-10-CM | POA: Diagnosis not present

## 2021-01-04 DIAGNOSIS — Z1502 Genetic susceptibility to malignant neoplasm of ovary: Secondary | ICD-10-CM | POA: Diagnosis not present

## 2021-01-04 DIAGNOSIS — Z1509 Genetic susceptibility to other malignant neoplasm: Secondary | ICD-10-CM | POA: Diagnosis not present

## 2021-01-04 DIAGNOSIS — C50919 Malignant neoplasm of unspecified site of unspecified female breast: Secondary | ICD-10-CM | POA: Diagnosis not present

## 2021-01-04 DIAGNOSIS — Z5111 Encounter for antineoplastic chemotherapy: Secondary | ICD-10-CM | POA: Diagnosis not present

## 2021-01-04 DIAGNOSIS — Z1589 Genetic susceptibility to other disease: Secondary | ICD-10-CM | POA: Diagnosis not present

## 2021-01-04 DIAGNOSIS — Z5112 Encounter for antineoplastic immunotherapy: Secondary | ICD-10-CM | POA: Diagnosis not present

## 2021-01-05 DIAGNOSIS — Z1589 Genetic susceptibility to other disease: Secondary | ICD-10-CM | POA: Diagnosis not present

## 2021-01-05 DIAGNOSIS — C50919 Malignant neoplasm of unspecified site of unspecified female breast: Secondary | ICD-10-CM | POA: Diagnosis not present

## 2021-01-05 DIAGNOSIS — Z1502 Genetic susceptibility to malignant neoplasm of ovary: Secondary | ICD-10-CM | POA: Diagnosis not present

## 2021-01-05 DIAGNOSIS — Z1509 Genetic susceptibility to other malignant neoplasm: Secondary | ICD-10-CM | POA: Diagnosis not present

## 2021-01-09 DIAGNOSIS — J101 Influenza due to other identified influenza virus with other respiratory manifestations: Secondary | ICD-10-CM | POA: Diagnosis not present

## 2021-01-09 DIAGNOSIS — Z20828 Contact with and (suspected) exposure to other viral communicable diseases: Secondary | ICD-10-CM | POA: Diagnosis not present

## 2021-01-10 DIAGNOSIS — F419 Anxiety disorder, unspecified: Secondary | ICD-10-CM | POA: Diagnosis not present

## 2021-01-10 DIAGNOSIS — Z20828 Contact with and (suspected) exposure to other viral communicable diseases: Secondary | ICD-10-CM | POA: Diagnosis not present

## 2021-01-22 DIAGNOSIS — I480 Paroxysmal atrial fibrillation: Secondary | ICD-10-CM | POA: Diagnosis not present

## 2021-01-23 DIAGNOSIS — Z1589 Genetic susceptibility to other disease: Secondary | ICD-10-CM | POA: Diagnosis not present

## 2021-01-23 DIAGNOSIS — Z1502 Genetic susceptibility to malignant neoplasm of ovary: Secondary | ICD-10-CM | POA: Diagnosis not present

## 2021-01-23 DIAGNOSIS — Z1509 Genetic susceptibility to other malignant neoplasm: Secondary | ICD-10-CM | POA: Diagnosis not present

## 2021-01-23 DIAGNOSIS — C50919 Malignant neoplasm of unspecified site of unspecified female breast: Secondary | ICD-10-CM | POA: Diagnosis not present

## 2021-01-23 DIAGNOSIS — Z09 Encounter for follow-up examination after completed treatment for conditions other than malignant neoplasm: Secondary | ICD-10-CM | POA: Diagnosis not present

## 2021-01-23 DIAGNOSIS — M899 Disorder of bone, unspecified: Secondary | ICD-10-CM | POA: Diagnosis not present

## 2021-01-24 DIAGNOSIS — C50919 Malignant neoplasm of unspecified site of unspecified female breast: Secondary | ICD-10-CM | POA: Diagnosis not present

## 2021-01-24 DIAGNOSIS — Z5111 Encounter for antineoplastic chemotherapy: Secondary | ICD-10-CM | POA: Diagnosis not present

## 2021-01-24 DIAGNOSIS — Z1502 Genetic susceptibility to malignant neoplasm of ovary: Secondary | ICD-10-CM | POA: Diagnosis not present

## 2021-01-24 DIAGNOSIS — Z79899 Other long term (current) drug therapy: Secondary | ICD-10-CM | POA: Diagnosis not present

## 2021-01-24 DIAGNOSIS — Z1509 Genetic susceptibility to other malignant neoplasm: Secondary | ICD-10-CM | POA: Diagnosis not present

## 2021-01-24 DIAGNOSIS — Z1589 Genetic susceptibility to other disease: Secondary | ICD-10-CM | POA: Diagnosis not present

## 2021-01-25 DIAGNOSIS — Z1502 Genetic susceptibility to malignant neoplasm of ovary: Secondary | ICD-10-CM | POA: Diagnosis not present

## 2021-01-25 DIAGNOSIS — C50919 Malignant neoplasm of unspecified site of unspecified female breast: Secondary | ICD-10-CM | POA: Diagnosis not present

## 2021-01-25 DIAGNOSIS — Z79899 Other long term (current) drug therapy: Secondary | ICD-10-CM | POA: Diagnosis not present

## 2021-01-25 DIAGNOSIS — Z1589 Genetic susceptibility to other disease: Secondary | ICD-10-CM | POA: Diagnosis not present

## 2021-01-25 DIAGNOSIS — Z1509 Genetic susceptibility to other malignant neoplasm: Secondary | ICD-10-CM | POA: Diagnosis not present

## 2021-01-26 DIAGNOSIS — Z516 Encounter for desensitization to allergens: Secondary | ICD-10-CM | POA: Diagnosis not present

## 2021-01-26 DIAGNOSIS — J4542 Moderate persistent asthma with status asthmaticus: Secondary | ICD-10-CM | POA: Diagnosis not present

## 2021-01-26 DIAGNOSIS — J4550 Severe persistent asthma, uncomplicated: Secondary | ICD-10-CM | POA: Diagnosis not present

## 2021-02-07 DIAGNOSIS — J45909 Unspecified asthma, uncomplicated: Secondary | ICD-10-CM | POA: Diagnosis not present

## 2021-02-07 DIAGNOSIS — I10 Essential (primary) hypertension: Secondary | ICD-10-CM | POA: Diagnosis not present

## 2021-02-07 DIAGNOSIS — G5793 Unspecified mononeuropathy of bilateral lower limbs: Secondary | ICD-10-CM | POA: Diagnosis not present

## 2021-02-07 DIAGNOSIS — L089 Local infection of the skin and subcutaneous tissue, unspecified: Secondary | ICD-10-CM | POA: Diagnosis not present

## 2021-02-07 DIAGNOSIS — B958 Unspecified staphylococcus as the cause of diseases classified elsewhere: Secondary | ICD-10-CM | POA: Diagnosis not present

## 2021-02-12 ENCOUNTER — Ambulatory Visit: Payer: Medicare PPO | Admitting: Adult Health

## 2021-02-12 DIAGNOSIS — M9903 Segmental and somatic dysfunction of lumbar region: Secondary | ICD-10-CM | POA: Diagnosis not present

## 2021-02-12 DIAGNOSIS — M47816 Spondylosis without myelopathy or radiculopathy, lumbar region: Secondary | ICD-10-CM | POA: Diagnosis not present

## 2021-02-12 DIAGNOSIS — M9902 Segmental and somatic dysfunction of thoracic region: Secondary | ICD-10-CM | POA: Diagnosis not present

## 2021-02-12 DIAGNOSIS — M9901 Segmental and somatic dysfunction of cervical region: Secondary | ICD-10-CM | POA: Diagnosis not present

## 2021-02-12 DIAGNOSIS — S233XXA Sprain of ligaments of thoracic spine, initial encounter: Secondary | ICD-10-CM | POA: Diagnosis not present

## 2021-02-12 DIAGNOSIS — M47812 Spondylosis without myelopathy or radiculopathy, cervical region: Secondary | ICD-10-CM | POA: Diagnosis not present

## 2021-02-13 ENCOUNTER — Other Ambulatory Visit (HOSPITAL_COMMUNITY): Payer: Self-pay | Admitting: *Deleted

## 2021-02-13 DIAGNOSIS — Z1509 Genetic susceptibility to other malignant neoplasm: Secondary | ICD-10-CM | POA: Diagnosis not present

## 2021-02-13 DIAGNOSIS — Z1589 Genetic susceptibility to other disease: Secondary | ICD-10-CM | POA: Diagnosis not present

## 2021-02-13 DIAGNOSIS — Z09 Encounter for follow-up examination after completed treatment for conditions other than malignant neoplasm: Secondary | ICD-10-CM | POA: Diagnosis not present

## 2021-02-13 DIAGNOSIS — C50919 Malignant neoplasm of unspecified site of unspecified female breast: Secondary | ICD-10-CM | POA: Diagnosis not present

## 2021-02-13 DIAGNOSIS — Z1502 Genetic susceptibility to malignant neoplasm of ovary: Secondary | ICD-10-CM | POA: Diagnosis not present

## 2021-02-13 DIAGNOSIS — M899 Disorder of bone, unspecified: Secondary | ICD-10-CM | POA: Diagnosis not present

## 2021-02-14 DIAGNOSIS — Z1509 Genetic susceptibility to other malignant neoplasm: Secondary | ICD-10-CM | POA: Diagnosis not present

## 2021-02-14 DIAGNOSIS — Z5111 Encounter for antineoplastic chemotherapy: Secondary | ICD-10-CM | POA: Diagnosis not present

## 2021-02-14 DIAGNOSIS — C50919 Malignant neoplasm of unspecified site of unspecified female breast: Secondary | ICD-10-CM | POA: Diagnosis not present

## 2021-02-14 DIAGNOSIS — Z1589 Genetic susceptibility to other disease: Secondary | ICD-10-CM | POA: Diagnosis not present

## 2021-02-14 DIAGNOSIS — Z1502 Genetic susceptibility to malignant neoplasm of ovary: Secondary | ICD-10-CM | POA: Diagnosis not present

## 2021-02-15 DIAGNOSIS — Z1502 Genetic susceptibility to malignant neoplasm of ovary: Secondary | ICD-10-CM | POA: Diagnosis not present

## 2021-02-15 DIAGNOSIS — Z1509 Genetic susceptibility to other malignant neoplasm: Secondary | ICD-10-CM | POA: Diagnosis not present

## 2021-02-15 DIAGNOSIS — I10 Essential (primary) hypertension: Secondary | ICD-10-CM | POA: Diagnosis not present

## 2021-02-15 DIAGNOSIS — C50919 Malignant neoplasm of unspecified site of unspecified female breast: Secondary | ICD-10-CM | POA: Diagnosis not present

## 2021-02-15 DIAGNOSIS — Z1589 Genetic susceptibility to other disease: Secondary | ICD-10-CM | POA: Diagnosis not present

## 2021-02-15 DIAGNOSIS — C50911 Malignant neoplasm of unspecified site of right female breast: Secondary | ICD-10-CM | POA: Diagnosis not present

## 2021-02-15 DIAGNOSIS — Z170 Estrogen receptor positive status [ER+]: Secondary | ICD-10-CM | POA: Diagnosis not present

## 2021-02-15 DIAGNOSIS — E119 Type 2 diabetes mellitus without complications: Secondary | ICD-10-CM | POA: Diagnosis not present

## 2021-02-15 DIAGNOSIS — C773 Secondary and unspecified malignant neoplasm of axilla and upper limb lymph nodes: Secondary | ICD-10-CM | POA: Diagnosis not present

## 2021-02-26 DIAGNOSIS — I82403 Acute embolism and thrombosis of unspecified deep veins of lower extremity, bilateral: Secondary | ICD-10-CM | POA: Diagnosis not present

## 2021-02-26 DIAGNOSIS — R600 Localized edema: Secondary | ICD-10-CM | POA: Diagnosis not present

## 2021-02-26 DIAGNOSIS — R062 Wheezing: Secondary | ICD-10-CM | POA: Diagnosis not present

## 2021-02-26 DIAGNOSIS — R0602 Shortness of breath: Secondary | ICD-10-CM | POA: Diagnosis not present

## 2021-02-26 DIAGNOSIS — C50919 Malignant neoplasm of unspecified site of unspecified female breast: Secondary | ICD-10-CM | POA: Diagnosis not present

## 2021-02-26 DIAGNOSIS — R9431 Abnormal electrocardiogram [ECG] [EKG]: Secondary | ICD-10-CM | POA: Diagnosis not present

## 2021-02-26 DIAGNOSIS — I119 Hypertensive heart disease without heart failure: Secondary | ICD-10-CM | POA: Diagnosis not present

## 2021-02-26 DIAGNOSIS — K449 Diaphragmatic hernia without obstruction or gangrene: Secondary | ICD-10-CM | POA: Diagnosis not present

## 2021-02-26 DIAGNOSIS — R0600 Dyspnea, unspecified: Secondary | ICD-10-CM | POA: Diagnosis not present

## 2021-02-26 DIAGNOSIS — I700 Atherosclerosis of aorta: Secondary | ICD-10-CM | POA: Diagnosis not present

## 2021-02-26 DIAGNOSIS — E119 Type 2 diabetes mellitus without complications: Secondary | ICD-10-CM | POA: Diagnosis not present

## 2021-02-26 DIAGNOSIS — I517 Cardiomegaly: Secondary | ICD-10-CM | POA: Diagnosis not present

## 2021-02-27 DIAGNOSIS — C50911 Malignant neoplasm of unspecified site of right female breast: Secondary | ICD-10-CM | POA: Diagnosis not present

## 2021-02-27 DIAGNOSIS — Z170 Estrogen receptor positive status [ER+]: Secondary | ICD-10-CM | POA: Diagnosis not present

## 2021-02-27 DIAGNOSIS — E119 Type 2 diabetes mellitus without complications: Secondary | ICD-10-CM | POA: Diagnosis not present

## 2021-02-27 DIAGNOSIS — I10 Essential (primary) hypertension: Secondary | ICD-10-CM | POA: Diagnosis not present

## 2021-02-27 DIAGNOSIS — C773 Secondary and unspecified malignant neoplasm of axilla and upper limb lymph nodes: Secondary | ICD-10-CM | POA: Diagnosis not present

## 2021-02-27 DIAGNOSIS — C50811 Malignant neoplasm of overlapping sites of right female breast: Secondary | ICD-10-CM | POA: Diagnosis not present

## 2021-03-01 DIAGNOSIS — Z516 Encounter for desensitization to allergens: Secondary | ICD-10-CM | POA: Diagnosis not present

## 2021-03-01 DIAGNOSIS — G4733 Obstructive sleep apnea (adult) (pediatric): Secondary | ICD-10-CM | POA: Diagnosis not present

## 2021-03-01 DIAGNOSIS — C50911 Malignant neoplasm of unspecified site of right female breast: Secondary | ICD-10-CM | POA: Diagnosis not present

## 2021-03-01 DIAGNOSIS — J4550 Severe persistent asthma, uncomplicated: Secondary | ICD-10-CM | POA: Diagnosis not present

## 2021-03-01 DIAGNOSIS — J4542 Moderate persistent asthma with status asthmaticus: Secondary | ICD-10-CM | POA: Diagnosis not present

## 2021-03-01 DIAGNOSIS — I5030 Unspecified diastolic (congestive) heart failure: Secondary | ICD-10-CM | POA: Diagnosis not present

## 2021-03-01 DIAGNOSIS — J4540 Moderate persistent asthma, uncomplicated: Secondary | ICD-10-CM | POA: Diagnosis not present

## 2021-03-06 DIAGNOSIS — R0602 Shortness of breath: Secondary | ICD-10-CM | POA: Diagnosis not present

## 2021-03-06 DIAGNOSIS — C50919 Malignant neoplasm of unspecified site of unspecified female breast: Secondary | ICD-10-CM | POA: Diagnosis not present

## 2021-03-06 DIAGNOSIS — Z452 Encounter for adjustment and management of vascular access device: Secondary | ICD-10-CM | POA: Diagnosis not present

## 2021-03-06 DIAGNOSIS — Z9221 Personal history of antineoplastic chemotherapy: Secondary | ICD-10-CM | POA: Diagnosis not present

## 2021-03-06 DIAGNOSIS — Z1589 Genetic susceptibility to other disease: Secondary | ICD-10-CM | POA: Diagnosis not present

## 2021-03-06 DIAGNOSIS — Z1509 Genetic susceptibility to other malignant neoplasm: Secondary | ICD-10-CM | POA: Diagnosis not present

## 2021-03-06 DIAGNOSIS — Z1502 Genetic susceptibility to malignant neoplasm of ovary: Secondary | ICD-10-CM | POA: Diagnosis not present

## 2021-03-06 DIAGNOSIS — N281 Cyst of kidney, acquired: Secondary | ICD-10-CM | POA: Diagnosis not present

## 2021-03-06 DIAGNOSIS — Z09 Encounter for follow-up examination after completed treatment for conditions other than malignant neoplasm: Secondary | ICD-10-CM | POA: Diagnosis not present

## 2021-03-07 DIAGNOSIS — Z5112 Encounter for antineoplastic immunotherapy: Secondary | ICD-10-CM | POA: Diagnosis not present

## 2021-03-07 DIAGNOSIS — Z1502 Genetic susceptibility to malignant neoplasm of ovary: Secondary | ICD-10-CM | POA: Diagnosis not present

## 2021-03-07 DIAGNOSIS — Z1589 Genetic susceptibility to other disease: Secondary | ICD-10-CM | POA: Diagnosis not present

## 2021-03-07 DIAGNOSIS — Z1509 Genetic susceptibility to other malignant neoplasm: Secondary | ICD-10-CM | POA: Diagnosis not present

## 2021-03-07 DIAGNOSIS — C50919 Malignant neoplasm of unspecified site of unspecified female breast: Secondary | ICD-10-CM | POA: Diagnosis not present

## 2021-03-07 DIAGNOSIS — Z95828 Presence of other vascular implants and grafts: Secondary | ICD-10-CM | POA: Diagnosis not present

## 2021-03-12 DIAGNOSIS — C773 Secondary and unspecified malignant neoplasm of axilla and upper limb lymph nodes: Secondary | ICD-10-CM | POA: Diagnosis not present

## 2021-03-12 DIAGNOSIS — C50911 Malignant neoplasm of unspecified site of right female breast: Secondary | ICD-10-CM | POA: Diagnosis not present

## 2021-03-12 DIAGNOSIS — Z1502 Genetic susceptibility to malignant neoplasm of ovary: Secondary | ICD-10-CM | POA: Diagnosis not present

## 2021-03-12 DIAGNOSIS — Z09 Encounter for follow-up examination after completed treatment for conditions other than malignant neoplasm: Secondary | ICD-10-CM | POA: Diagnosis not present

## 2021-03-12 DIAGNOSIS — Z170 Estrogen receptor positive status [ER+]: Secondary | ICD-10-CM | POA: Diagnosis not present

## 2021-03-12 DIAGNOSIS — C50811 Malignant neoplasm of overlapping sites of right female breast: Secondary | ICD-10-CM | POA: Diagnosis not present

## 2021-03-12 DIAGNOSIS — I10 Essential (primary) hypertension: Secondary | ICD-10-CM | POA: Diagnosis not present

## 2021-03-12 DIAGNOSIS — C50919 Malignant neoplasm of unspecified site of unspecified female breast: Secondary | ICD-10-CM | POA: Diagnosis not present

## 2021-03-12 DIAGNOSIS — E119 Type 2 diabetes mellitus without complications: Secondary | ICD-10-CM | POA: Diagnosis not present

## 2021-03-12 DIAGNOSIS — Z1589 Genetic susceptibility to other disease: Secondary | ICD-10-CM | POA: Diagnosis not present

## 2021-03-13 DIAGNOSIS — Z1502 Genetic susceptibility to malignant neoplasm of ovary: Secondary | ICD-10-CM | POA: Diagnosis not present

## 2021-03-13 DIAGNOSIS — Z170 Estrogen receptor positive status [ER+]: Secondary | ICD-10-CM | POA: Diagnosis not present

## 2021-03-13 DIAGNOSIS — C50919 Malignant neoplasm of unspecified site of unspecified female breast: Secondary | ICD-10-CM | POA: Diagnosis not present

## 2021-03-13 DIAGNOSIS — C50911 Malignant neoplasm of unspecified site of right female breast: Secondary | ICD-10-CM | POA: Diagnosis not present

## 2021-03-13 DIAGNOSIS — I10 Essential (primary) hypertension: Secondary | ICD-10-CM | POA: Diagnosis not present

## 2021-03-13 DIAGNOSIS — E119 Type 2 diabetes mellitus without complications: Secondary | ICD-10-CM | POA: Diagnosis not present

## 2021-03-13 DIAGNOSIS — C773 Secondary and unspecified malignant neoplasm of axilla and upper limb lymph nodes: Secondary | ICD-10-CM | POA: Diagnosis not present

## 2021-03-13 DIAGNOSIS — Z09 Encounter for follow-up examination after completed treatment for conditions other than malignant neoplasm: Secondary | ICD-10-CM | POA: Diagnosis not present

## 2021-03-13 DIAGNOSIS — C50811 Malignant neoplasm of overlapping sites of right female breast: Secondary | ICD-10-CM | POA: Diagnosis not present

## 2021-03-13 DIAGNOSIS — Z1589 Genetic susceptibility to other disease: Secondary | ICD-10-CM | POA: Diagnosis not present

## 2021-03-14 DIAGNOSIS — Z09 Encounter for follow-up examination after completed treatment for conditions other than malignant neoplasm: Secondary | ICD-10-CM | POA: Diagnosis not present

## 2021-03-14 DIAGNOSIS — C50811 Malignant neoplasm of overlapping sites of right female breast: Secondary | ICD-10-CM | POA: Diagnosis not present

## 2021-03-14 DIAGNOSIS — E119 Type 2 diabetes mellitus without complications: Secondary | ICD-10-CM | POA: Diagnosis not present

## 2021-03-14 DIAGNOSIS — Z170 Estrogen receptor positive status [ER+]: Secondary | ICD-10-CM | POA: Diagnosis not present

## 2021-03-14 DIAGNOSIS — C773 Secondary and unspecified malignant neoplasm of axilla and upper limb lymph nodes: Secondary | ICD-10-CM | POA: Diagnosis not present

## 2021-03-14 DIAGNOSIS — Z1502 Genetic susceptibility to malignant neoplasm of ovary: Secondary | ICD-10-CM | POA: Diagnosis not present

## 2021-03-14 DIAGNOSIS — C50911 Malignant neoplasm of unspecified site of right female breast: Secondary | ICD-10-CM | POA: Diagnosis not present

## 2021-03-14 DIAGNOSIS — C50919 Malignant neoplasm of unspecified site of unspecified female breast: Secondary | ICD-10-CM | POA: Diagnosis not present

## 2021-03-14 DIAGNOSIS — Z1589 Genetic susceptibility to other disease: Secondary | ICD-10-CM | POA: Diagnosis not present

## 2021-03-14 DIAGNOSIS — I10 Essential (primary) hypertension: Secondary | ICD-10-CM | POA: Diagnosis not present

## 2021-03-15 DIAGNOSIS — C773 Secondary and unspecified malignant neoplasm of axilla and upper limb lymph nodes: Secondary | ICD-10-CM | POA: Diagnosis not present

## 2021-03-15 DIAGNOSIS — N281 Cyst of kidney, acquired: Secondary | ICD-10-CM | POA: Diagnosis not present

## 2021-03-15 DIAGNOSIS — C50911 Malignant neoplasm of unspecified site of right female breast: Secondary | ICD-10-CM | POA: Diagnosis not present

## 2021-03-15 DIAGNOSIS — K760 Fatty (change of) liver, not elsewhere classified: Secondary | ICD-10-CM | POA: Diagnosis not present

## 2021-03-15 DIAGNOSIS — C50919 Malignant neoplasm of unspecified site of unspecified female breast: Secondary | ICD-10-CM | POA: Diagnosis not present

## 2021-03-15 DIAGNOSIS — Z1502 Genetic susceptibility to malignant neoplasm of ovary: Secondary | ICD-10-CM | POA: Diagnosis not present

## 2021-03-15 DIAGNOSIS — Z9049 Acquired absence of other specified parts of digestive tract: Secondary | ICD-10-CM | POA: Diagnosis not present

## 2021-03-15 DIAGNOSIS — Z09 Encounter for follow-up examination after completed treatment for conditions other than malignant neoplasm: Secondary | ICD-10-CM | POA: Diagnosis not present

## 2021-03-15 DIAGNOSIS — C50811 Malignant neoplasm of overlapping sites of right female breast: Secondary | ICD-10-CM | POA: Diagnosis not present

## 2021-03-15 DIAGNOSIS — Z170 Estrogen receptor positive status [ER+]: Secondary | ICD-10-CM | POA: Diagnosis not present

## 2021-03-15 DIAGNOSIS — I10 Essential (primary) hypertension: Secondary | ICD-10-CM | POA: Diagnosis not present

## 2021-03-15 DIAGNOSIS — E119 Type 2 diabetes mellitus without complications: Secondary | ICD-10-CM | POA: Diagnosis not present

## 2021-03-15 DIAGNOSIS — Z1589 Genetic susceptibility to other disease: Secondary | ICD-10-CM | POA: Diagnosis not present

## 2021-03-16 DIAGNOSIS — C50919 Malignant neoplasm of unspecified site of unspecified female breast: Secondary | ICD-10-CM | POA: Diagnosis not present

## 2021-03-16 DIAGNOSIS — Z1502 Genetic susceptibility to malignant neoplasm of ovary: Secondary | ICD-10-CM | POA: Diagnosis not present

## 2021-03-16 DIAGNOSIS — Z1589 Genetic susceptibility to other disease: Secondary | ICD-10-CM | POA: Diagnosis not present

## 2021-03-16 DIAGNOSIS — C50911 Malignant neoplasm of unspecified site of right female breast: Secondary | ICD-10-CM | POA: Diagnosis not present

## 2021-03-16 DIAGNOSIS — E119 Type 2 diabetes mellitus without complications: Secondary | ICD-10-CM | POA: Diagnosis not present

## 2021-03-16 DIAGNOSIS — C50811 Malignant neoplasm of overlapping sites of right female breast: Secondary | ICD-10-CM | POA: Diagnosis not present

## 2021-03-16 DIAGNOSIS — Z170 Estrogen receptor positive status [ER+]: Secondary | ICD-10-CM | POA: Diagnosis not present

## 2021-03-16 DIAGNOSIS — I10 Essential (primary) hypertension: Secondary | ICD-10-CM | POA: Diagnosis not present

## 2021-03-16 DIAGNOSIS — C773 Secondary and unspecified malignant neoplasm of axilla and upper limb lymph nodes: Secondary | ICD-10-CM | POA: Diagnosis not present

## 2021-03-16 DIAGNOSIS — Z09 Encounter for follow-up examination after completed treatment for conditions other than malignant neoplasm: Secondary | ICD-10-CM | POA: Diagnosis not present

## 2021-03-19 DIAGNOSIS — C773 Secondary and unspecified malignant neoplasm of axilla and upper limb lymph nodes: Secondary | ICD-10-CM | POA: Diagnosis not present

## 2021-03-19 DIAGNOSIS — C50811 Malignant neoplasm of overlapping sites of right female breast: Secondary | ICD-10-CM | POA: Diagnosis not present

## 2021-03-19 DIAGNOSIS — I10 Essential (primary) hypertension: Secondary | ICD-10-CM | POA: Diagnosis not present

## 2021-03-19 DIAGNOSIS — E119 Type 2 diabetes mellitus without complications: Secondary | ICD-10-CM | POA: Diagnosis not present

## 2021-03-19 DIAGNOSIS — C50911 Malignant neoplasm of unspecified site of right female breast: Secondary | ICD-10-CM | POA: Diagnosis not present

## 2021-03-19 DIAGNOSIS — Z1589 Genetic susceptibility to other disease: Secondary | ICD-10-CM | POA: Diagnosis not present

## 2021-03-19 DIAGNOSIS — Z170 Estrogen receptor positive status [ER+]: Secondary | ICD-10-CM | POA: Diagnosis not present

## 2021-03-19 DIAGNOSIS — Z1502 Genetic susceptibility to malignant neoplasm of ovary: Secondary | ICD-10-CM | POA: Diagnosis not present

## 2021-03-19 DIAGNOSIS — Z09 Encounter for follow-up examination after completed treatment for conditions other than malignant neoplasm: Secondary | ICD-10-CM | POA: Diagnosis not present

## 2021-03-19 DIAGNOSIS — C50919 Malignant neoplasm of unspecified site of unspecified female breast: Secondary | ICD-10-CM | POA: Diagnosis not present

## 2021-03-20 DIAGNOSIS — C50919 Malignant neoplasm of unspecified site of unspecified female breast: Secondary | ICD-10-CM | POA: Diagnosis not present

## 2021-03-20 DIAGNOSIS — Z1589 Genetic susceptibility to other disease: Secondary | ICD-10-CM | POA: Diagnosis not present

## 2021-03-20 DIAGNOSIS — E119 Type 2 diabetes mellitus without complications: Secondary | ICD-10-CM | POA: Diagnosis not present

## 2021-03-20 DIAGNOSIS — Z1502 Genetic susceptibility to malignant neoplasm of ovary: Secondary | ICD-10-CM | POA: Diagnosis not present

## 2021-03-20 DIAGNOSIS — Z170 Estrogen receptor positive status [ER+]: Secondary | ICD-10-CM | POA: Diagnosis not present

## 2021-03-20 DIAGNOSIS — Z09 Encounter for follow-up examination after completed treatment for conditions other than malignant neoplasm: Secondary | ICD-10-CM | POA: Diagnosis not present

## 2021-03-20 DIAGNOSIS — C50911 Malignant neoplasm of unspecified site of right female breast: Secondary | ICD-10-CM | POA: Diagnosis not present

## 2021-03-20 DIAGNOSIS — C50811 Malignant neoplasm of overlapping sites of right female breast: Secondary | ICD-10-CM | POA: Diagnosis not present

## 2021-03-20 DIAGNOSIS — C773 Secondary and unspecified malignant neoplasm of axilla and upper limb lymph nodes: Secondary | ICD-10-CM | POA: Diagnosis not present

## 2021-03-20 DIAGNOSIS — I10 Essential (primary) hypertension: Secondary | ICD-10-CM | POA: Diagnosis not present

## 2021-03-21 DIAGNOSIS — Z170 Estrogen receptor positive status [ER+]: Secondary | ICD-10-CM | POA: Diagnosis not present

## 2021-03-21 DIAGNOSIS — Z1502 Genetic susceptibility to malignant neoplasm of ovary: Secondary | ICD-10-CM | POA: Diagnosis not present

## 2021-03-21 DIAGNOSIS — Z1589 Genetic susceptibility to other disease: Secondary | ICD-10-CM | POA: Diagnosis not present

## 2021-03-21 DIAGNOSIS — C50919 Malignant neoplasm of unspecified site of unspecified female breast: Secondary | ICD-10-CM | POA: Diagnosis not present

## 2021-03-21 DIAGNOSIS — I10 Essential (primary) hypertension: Secondary | ICD-10-CM | POA: Diagnosis not present

## 2021-03-21 DIAGNOSIS — C773 Secondary and unspecified malignant neoplasm of axilla and upper limb lymph nodes: Secondary | ICD-10-CM | POA: Diagnosis not present

## 2021-03-21 DIAGNOSIS — C50911 Malignant neoplasm of unspecified site of right female breast: Secondary | ICD-10-CM | POA: Diagnosis not present

## 2021-03-21 DIAGNOSIS — Z09 Encounter for follow-up examination after completed treatment for conditions other than malignant neoplasm: Secondary | ICD-10-CM | POA: Diagnosis not present

## 2021-03-21 DIAGNOSIS — C50811 Malignant neoplasm of overlapping sites of right female breast: Secondary | ICD-10-CM | POA: Diagnosis not present

## 2021-03-21 DIAGNOSIS — E119 Type 2 diabetes mellitus without complications: Secondary | ICD-10-CM | POA: Diagnosis not present

## 2021-03-22 DIAGNOSIS — I10 Essential (primary) hypertension: Secondary | ICD-10-CM | POA: Diagnosis not present

## 2021-03-22 DIAGNOSIS — Z170 Estrogen receptor positive status [ER+]: Secondary | ICD-10-CM | POA: Diagnosis not present

## 2021-03-22 DIAGNOSIS — C773 Secondary and unspecified malignant neoplasm of axilla and upper limb lymph nodes: Secondary | ICD-10-CM | POA: Diagnosis not present

## 2021-03-22 DIAGNOSIS — Z09 Encounter for follow-up examination after completed treatment for conditions other than malignant neoplasm: Secondary | ICD-10-CM | POA: Diagnosis not present

## 2021-03-22 DIAGNOSIS — C50911 Malignant neoplasm of unspecified site of right female breast: Secondary | ICD-10-CM | POA: Diagnosis not present

## 2021-03-22 DIAGNOSIS — E119 Type 2 diabetes mellitus without complications: Secondary | ICD-10-CM | POA: Diagnosis not present

## 2021-03-22 DIAGNOSIS — C50811 Malignant neoplasm of overlapping sites of right female breast: Secondary | ICD-10-CM | POA: Diagnosis not present

## 2021-03-22 DIAGNOSIS — C50919 Malignant neoplasm of unspecified site of unspecified female breast: Secondary | ICD-10-CM | POA: Diagnosis not present

## 2021-03-22 DIAGNOSIS — Z1589 Genetic susceptibility to other disease: Secondary | ICD-10-CM | POA: Diagnosis not present

## 2021-03-22 DIAGNOSIS — Z1502 Genetic susceptibility to malignant neoplasm of ovary: Secondary | ICD-10-CM | POA: Diagnosis not present

## 2021-03-23 DIAGNOSIS — I10 Essential (primary) hypertension: Secondary | ICD-10-CM | POA: Diagnosis not present

## 2021-03-23 DIAGNOSIS — C50919 Malignant neoplasm of unspecified site of unspecified female breast: Secondary | ICD-10-CM | POA: Diagnosis not present

## 2021-03-23 DIAGNOSIS — Z1502 Genetic susceptibility to malignant neoplasm of ovary: Secondary | ICD-10-CM | POA: Diagnosis not present

## 2021-03-23 DIAGNOSIS — C50911 Malignant neoplasm of unspecified site of right female breast: Secondary | ICD-10-CM | POA: Diagnosis not present

## 2021-03-23 DIAGNOSIS — C773 Secondary and unspecified malignant neoplasm of axilla and upper limb lymph nodes: Secondary | ICD-10-CM | POA: Diagnosis not present

## 2021-03-23 DIAGNOSIS — Z1589 Genetic susceptibility to other disease: Secondary | ICD-10-CM | POA: Diagnosis not present

## 2021-03-23 DIAGNOSIS — Z09 Encounter for follow-up examination after completed treatment for conditions other than malignant neoplasm: Secondary | ICD-10-CM | POA: Diagnosis not present

## 2021-03-23 DIAGNOSIS — C50811 Malignant neoplasm of overlapping sites of right female breast: Secondary | ICD-10-CM | POA: Diagnosis not present

## 2021-03-23 DIAGNOSIS — Z170 Estrogen receptor positive status [ER+]: Secondary | ICD-10-CM | POA: Diagnosis not present

## 2021-03-23 DIAGNOSIS — E119 Type 2 diabetes mellitus without complications: Secondary | ICD-10-CM | POA: Diagnosis not present

## 2021-03-26 DIAGNOSIS — C773 Secondary and unspecified malignant neoplasm of axilla and upper limb lymph nodes: Secondary | ICD-10-CM | POA: Diagnosis not present

## 2021-03-26 DIAGNOSIS — Z1589 Genetic susceptibility to other disease: Secondary | ICD-10-CM | POA: Diagnosis not present

## 2021-03-26 DIAGNOSIS — Z170 Estrogen receptor positive status [ER+]: Secondary | ICD-10-CM | POA: Diagnosis not present

## 2021-03-26 DIAGNOSIS — Z09 Encounter for follow-up examination after completed treatment for conditions other than malignant neoplasm: Secondary | ICD-10-CM | POA: Diagnosis not present

## 2021-03-26 DIAGNOSIS — C50919 Malignant neoplasm of unspecified site of unspecified female breast: Secondary | ICD-10-CM | POA: Diagnosis not present

## 2021-03-26 DIAGNOSIS — C50911 Malignant neoplasm of unspecified site of right female breast: Secondary | ICD-10-CM | POA: Diagnosis not present

## 2021-03-26 DIAGNOSIS — Z1502 Genetic susceptibility to malignant neoplasm of ovary: Secondary | ICD-10-CM | POA: Diagnosis not present

## 2021-03-26 DIAGNOSIS — C50811 Malignant neoplasm of overlapping sites of right female breast: Secondary | ICD-10-CM | POA: Diagnosis not present

## 2021-03-26 DIAGNOSIS — E119 Type 2 diabetes mellitus without complications: Secondary | ICD-10-CM | POA: Diagnosis not present

## 2021-03-26 DIAGNOSIS — I10 Essential (primary) hypertension: Secondary | ICD-10-CM | POA: Diagnosis not present

## 2021-03-27 DIAGNOSIS — Z1502 Genetic susceptibility to malignant neoplasm of ovary: Secondary | ICD-10-CM | POA: Diagnosis not present

## 2021-03-27 DIAGNOSIS — E079 Disorder of thyroid, unspecified: Secondary | ICD-10-CM | POA: Diagnosis not present

## 2021-03-27 DIAGNOSIS — Z1329 Encounter for screening for other suspected endocrine disorder: Secondary | ICD-10-CM | POA: Diagnosis not present

## 2021-03-27 DIAGNOSIS — Z1589 Genetic susceptibility to other disease: Secondary | ICD-10-CM | POA: Diagnosis not present

## 2021-03-27 DIAGNOSIS — E119 Type 2 diabetes mellitus without complications: Secondary | ICD-10-CM | POA: Diagnosis not present

## 2021-03-27 DIAGNOSIS — Z1509 Genetic susceptibility to other malignant neoplasm: Secondary | ICD-10-CM | POA: Diagnosis not present

## 2021-03-27 DIAGNOSIS — C773 Secondary and unspecified malignant neoplasm of axilla and upper limb lymph nodes: Secondary | ICD-10-CM | POA: Diagnosis not present

## 2021-03-27 DIAGNOSIS — C50919 Malignant neoplasm of unspecified site of unspecified female breast: Secondary | ICD-10-CM | POA: Diagnosis not present

## 2021-03-27 DIAGNOSIS — Z09 Encounter for follow-up examination after completed treatment for conditions other than malignant neoplasm: Secondary | ICD-10-CM | POA: Diagnosis not present

## 2021-03-27 DIAGNOSIS — Z170 Estrogen receptor positive status [ER+]: Secondary | ICD-10-CM | POA: Diagnosis not present

## 2021-03-27 DIAGNOSIS — C50811 Malignant neoplasm of overlapping sites of right female breast: Secondary | ICD-10-CM | POA: Diagnosis not present

## 2021-03-27 DIAGNOSIS — C50911 Malignant neoplasm of unspecified site of right female breast: Secondary | ICD-10-CM | POA: Diagnosis not present

## 2021-03-27 DIAGNOSIS — R0602 Shortness of breath: Secondary | ICD-10-CM | POA: Diagnosis not present

## 2021-03-27 DIAGNOSIS — I10 Essential (primary) hypertension: Secondary | ICD-10-CM | POA: Diagnosis not present

## 2021-03-28 DIAGNOSIS — E119 Type 2 diabetes mellitus without complications: Secondary | ICD-10-CM | POA: Diagnosis not present

## 2021-03-28 DIAGNOSIS — C773 Secondary and unspecified malignant neoplasm of axilla and upper limb lymph nodes: Secondary | ICD-10-CM | POA: Diagnosis not present

## 2021-03-28 DIAGNOSIS — Z170 Estrogen receptor positive status [ER+]: Secondary | ICD-10-CM | POA: Diagnosis not present

## 2021-03-28 DIAGNOSIS — C50811 Malignant neoplasm of overlapping sites of right female breast: Secondary | ICD-10-CM | POA: Diagnosis not present

## 2021-03-28 DIAGNOSIS — C50911 Malignant neoplasm of unspecified site of right female breast: Secondary | ICD-10-CM | POA: Diagnosis not present

## 2021-03-28 DIAGNOSIS — I10 Essential (primary) hypertension: Secondary | ICD-10-CM | POA: Diagnosis not present

## 2021-03-28 DIAGNOSIS — Z1589 Genetic susceptibility to other disease: Secondary | ICD-10-CM | POA: Diagnosis not present

## 2021-03-28 DIAGNOSIS — Z1502 Genetic susceptibility to malignant neoplasm of ovary: Secondary | ICD-10-CM | POA: Diagnosis not present

## 2021-03-28 DIAGNOSIS — C50919 Malignant neoplasm of unspecified site of unspecified female breast: Secondary | ICD-10-CM | POA: Diagnosis not present

## 2021-03-28 DIAGNOSIS — Z09 Encounter for follow-up examination after completed treatment for conditions other than malignant neoplasm: Secondary | ICD-10-CM | POA: Diagnosis not present

## 2021-03-29 DIAGNOSIS — Z09 Encounter for follow-up examination after completed treatment for conditions other than malignant neoplasm: Secondary | ICD-10-CM | POA: Diagnosis not present

## 2021-03-29 DIAGNOSIS — Z1589 Genetic susceptibility to other disease: Secondary | ICD-10-CM | POA: Diagnosis not present

## 2021-03-29 DIAGNOSIS — E119 Type 2 diabetes mellitus without complications: Secondary | ICD-10-CM | POA: Diagnosis not present

## 2021-03-29 DIAGNOSIS — C50919 Malignant neoplasm of unspecified site of unspecified female breast: Secondary | ICD-10-CM | POA: Diagnosis not present

## 2021-03-29 DIAGNOSIS — Z1502 Genetic susceptibility to malignant neoplasm of ovary: Secondary | ICD-10-CM | POA: Diagnosis not present

## 2021-03-29 DIAGNOSIS — C773 Secondary and unspecified malignant neoplasm of axilla and upper limb lymph nodes: Secondary | ICD-10-CM | POA: Diagnosis not present

## 2021-03-29 DIAGNOSIS — J4550 Severe persistent asthma, uncomplicated: Secondary | ICD-10-CM | POA: Diagnosis not present

## 2021-03-29 DIAGNOSIS — I10 Essential (primary) hypertension: Secondary | ICD-10-CM | POA: Diagnosis not present

## 2021-03-29 DIAGNOSIS — J4542 Moderate persistent asthma with status asthmaticus: Secondary | ICD-10-CM | POA: Diagnosis not present

## 2021-03-29 DIAGNOSIS — C50811 Malignant neoplasm of overlapping sites of right female breast: Secondary | ICD-10-CM | POA: Diagnosis not present

## 2021-03-29 DIAGNOSIS — C50911 Malignant neoplasm of unspecified site of right female breast: Secondary | ICD-10-CM | POA: Diagnosis not present

## 2021-03-29 DIAGNOSIS — Z516 Encounter for desensitization to allergens: Secondary | ICD-10-CM | POA: Diagnosis not present

## 2021-03-29 DIAGNOSIS — Z170 Estrogen receptor positive status [ER+]: Secondary | ICD-10-CM | POA: Diagnosis not present

## 2021-03-30 DIAGNOSIS — C50919 Malignant neoplasm of unspecified site of unspecified female breast: Secondary | ICD-10-CM | POA: Diagnosis not present

## 2021-03-30 DIAGNOSIS — C773 Secondary and unspecified malignant neoplasm of axilla and upper limb lymph nodes: Secondary | ICD-10-CM | POA: Diagnosis not present

## 2021-03-30 DIAGNOSIS — C50911 Malignant neoplasm of unspecified site of right female breast: Secondary | ICD-10-CM | POA: Diagnosis not present

## 2021-03-30 DIAGNOSIS — C50811 Malignant neoplasm of overlapping sites of right female breast: Secondary | ICD-10-CM | POA: Diagnosis not present

## 2021-03-30 DIAGNOSIS — Z1502 Genetic susceptibility to malignant neoplasm of ovary: Secondary | ICD-10-CM | POA: Diagnosis not present

## 2021-03-30 DIAGNOSIS — Z1589 Genetic susceptibility to other disease: Secondary | ICD-10-CM | POA: Diagnosis not present

## 2021-03-30 DIAGNOSIS — I10 Essential (primary) hypertension: Secondary | ICD-10-CM | POA: Diagnosis not present

## 2021-03-30 DIAGNOSIS — E119 Type 2 diabetes mellitus without complications: Secondary | ICD-10-CM | POA: Diagnosis not present

## 2021-03-30 DIAGNOSIS — Z09 Encounter for follow-up examination after completed treatment for conditions other than malignant neoplasm: Secondary | ICD-10-CM | POA: Diagnosis not present

## 2021-03-30 DIAGNOSIS — Z170 Estrogen receptor positive status [ER+]: Secondary | ICD-10-CM | POA: Diagnosis not present

## 2021-04-03 DIAGNOSIS — M9903 Segmental and somatic dysfunction of lumbar region: Secondary | ICD-10-CM | POA: Diagnosis not present

## 2021-04-03 DIAGNOSIS — M9901 Segmental and somatic dysfunction of cervical region: Secondary | ICD-10-CM | POA: Diagnosis not present

## 2021-04-03 DIAGNOSIS — M47812 Spondylosis without myelopathy or radiculopathy, cervical region: Secondary | ICD-10-CM | POA: Diagnosis not present

## 2021-04-03 DIAGNOSIS — M9902 Segmental and somatic dysfunction of thoracic region: Secondary | ICD-10-CM | POA: Diagnosis not present

## 2021-04-03 DIAGNOSIS — S233XXA Sprain of ligaments of thoracic spine, initial encounter: Secondary | ICD-10-CM | POA: Diagnosis not present

## 2021-04-03 DIAGNOSIS — M47816 Spondylosis without myelopathy or radiculopathy, lumbar region: Secondary | ICD-10-CM | POA: Diagnosis not present

## 2021-04-04 DIAGNOSIS — C50811 Malignant neoplasm of overlapping sites of right female breast: Secondary | ICD-10-CM | POA: Diagnosis not present

## 2021-04-04 DIAGNOSIS — E119 Type 2 diabetes mellitus without complications: Secondary | ICD-10-CM | POA: Diagnosis not present

## 2021-04-04 DIAGNOSIS — C773 Secondary and unspecified malignant neoplasm of axilla and upper limb lymph nodes: Secondary | ICD-10-CM | POA: Diagnosis not present

## 2021-04-04 DIAGNOSIS — Z09 Encounter for follow-up examination after completed treatment for conditions other than malignant neoplasm: Secondary | ICD-10-CM | POA: Diagnosis not present

## 2021-04-04 DIAGNOSIS — C50919 Malignant neoplasm of unspecified site of unspecified female breast: Secondary | ICD-10-CM | POA: Diagnosis not present

## 2021-04-04 DIAGNOSIS — Z1589 Genetic susceptibility to other disease: Secondary | ICD-10-CM | POA: Diagnosis not present

## 2021-04-04 DIAGNOSIS — Z1502 Genetic susceptibility to malignant neoplasm of ovary: Secondary | ICD-10-CM | POA: Diagnosis not present

## 2021-04-04 DIAGNOSIS — C50911 Malignant neoplasm of unspecified site of right female breast: Secondary | ICD-10-CM | POA: Diagnosis not present

## 2021-04-04 DIAGNOSIS — I10 Essential (primary) hypertension: Secondary | ICD-10-CM | POA: Diagnosis not present

## 2021-04-04 DIAGNOSIS — Z170 Estrogen receptor positive status [ER+]: Secondary | ICD-10-CM | POA: Diagnosis not present

## 2021-04-05 DIAGNOSIS — Z1502 Genetic susceptibility to malignant neoplasm of ovary: Secondary | ICD-10-CM | POA: Diagnosis not present

## 2021-04-05 DIAGNOSIS — Z1589 Genetic susceptibility to other disease: Secondary | ICD-10-CM | POA: Diagnosis not present

## 2021-04-05 DIAGNOSIS — C50811 Malignant neoplasm of overlapping sites of right female breast: Secondary | ICD-10-CM | POA: Diagnosis not present

## 2021-04-05 DIAGNOSIS — C773 Secondary and unspecified malignant neoplasm of axilla and upper limb lymph nodes: Secondary | ICD-10-CM | POA: Diagnosis not present

## 2021-04-05 DIAGNOSIS — I10 Essential (primary) hypertension: Secondary | ICD-10-CM | POA: Diagnosis not present

## 2021-04-05 DIAGNOSIS — R21 Rash and other nonspecific skin eruption: Secondary | ICD-10-CM | POA: Diagnosis not present

## 2021-04-05 DIAGNOSIS — C50911 Malignant neoplasm of unspecified site of right female breast: Secondary | ICD-10-CM | POA: Diagnosis not present

## 2021-04-05 DIAGNOSIS — Z170 Estrogen receptor positive status [ER+]: Secondary | ICD-10-CM | POA: Diagnosis not present

## 2021-04-05 DIAGNOSIS — E119 Type 2 diabetes mellitus without complications: Secondary | ICD-10-CM | POA: Diagnosis not present

## 2021-04-05 DIAGNOSIS — C50919 Malignant neoplasm of unspecified site of unspecified female breast: Secondary | ICD-10-CM | POA: Diagnosis not present

## 2021-04-06 DIAGNOSIS — R21 Rash and other nonspecific skin eruption: Secondary | ICD-10-CM | POA: Diagnosis not present

## 2021-04-06 DIAGNOSIS — C50919 Malignant neoplasm of unspecified site of unspecified female breast: Secondary | ICD-10-CM | POA: Diagnosis not present

## 2021-04-06 DIAGNOSIS — C50811 Malignant neoplasm of overlapping sites of right female breast: Secondary | ICD-10-CM | POA: Diagnosis not present

## 2021-04-06 DIAGNOSIS — E119 Type 2 diabetes mellitus without complications: Secondary | ICD-10-CM | POA: Diagnosis not present

## 2021-04-06 DIAGNOSIS — C50911 Malignant neoplasm of unspecified site of right female breast: Secondary | ICD-10-CM | POA: Diagnosis not present

## 2021-04-06 DIAGNOSIS — C773 Secondary and unspecified malignant neoplasm of axilla and upper limb lymph nodes: Secondary | ICD-10-CM | POA: Diagnosis not present

## 2021-04-06 DIAGNOSIS — Z1589 Genetic susceptibility to other disease: Secondary | ICD-10-CM | POA: Diagnosis not present

## 2021-04-06 DIAGNOSIS — I10 Essential (primary) hypertension: Secondary | ICD-10-CM | POA: Diagnosis not present

## 2021-04-06 DIAGNOSIS — Z1502 Genetic susceptibility to malignant neoplasm of ovary: Secondary | ICD-10-CM | POA: Diagnosis not present

## 2021-04-06 DIAGNOSIS — Z170 Estrogen receptor positive status [ER+]: Secondary | ICD-10-CM | POA: Diagnosis not present

## 2021-04-10 DIAGNOSIS — C773 Secondary and unspecified malignant neoplasm of axilla and upper limb lymph nodes: Secondary | ICD-10-CM | POA: Diagnosis not present

## 2021-04-10 DIAGNOSIS — E119 Type 2 diabetes mellitus without complications: Secondary | ICD-10-CM | POA: Diagnosis not present

## 2021-04-10 DIAGNOSIS — Z1502 Genetic susceptibility to malignant neoplasm of ovary: Secondary | ICD-10-CM | POA: Diagnosis not present

## 2021-04-10 DIAGNOSIS — C50811 Malignant neoplasm of overlapping sites of right female breast: Secondary | ICD-10-CM | POA: Diagnosis not present

## 2021-04-10 DIAGNOSIS — I10 Essential (primary) hypertension: Secondary | ICD-10-CM | POA: Diagnosis not present

## 2021-04-10 DIAGNOSIS — R21 Rash and other nonspecific skin eruption: Secondary | ICD-10-CM | POA: Diagnosis not present

## 2021-04-10 DIAGNOSIS — Z170 Estrogen receptor positive status [ER+]: Secondary | ICD-10-CM | POA: Diagnosis not present

## 2021-04-10 DIAGNOSIS — Z1589 Genetic susceptibility to other disease: Secondary | ICD-10-CM | POA: Diagnosis not present

## 2021-04-10 DIAGNOSIS — C50911 Malignant neoplasm of unspecified site of right female breast: Secondary | ICD-10-CM | POA: Diagnosis not present

## 2021-04-10 DIAGNOSIS — C50919 Malignant neoplasm of unspecified site of unspecified female breast: Secondary | ICD-10-CM | POA: Diagnosis not present

## 2021-04-11 DIAGNOSIS — I272 Pulmonary hypertension, unspecified: Secondary | ICD-10-CM | POA: Diagnosis not present

## 2021-04-11 DIAGNOSIS — E119 Type 2 diabetes mellitus without complications: Secondary | ICD-10-CM | POA: Diagnosis not present

## 2021-04-11 DIAGNOSIS — C50811 Malignant neoplasm of overlapping sites of right female breast: Secondary | ICD-10-CM | POA: Diagnosis not present

## 2021-04-11 DIAGNOSIS — I10 Essential (primary) hypertension: Secondary | ICD-10-CM | POA: Diagnosis not present

## 2021-04-11 DIAGNOSIS — I517 Cardiomegaly: Secondary | ICD-10-CM | POA: Diagnosis not present

## 2021-04-11 DIAGNOSIS — C773 Secondary and unspecified malignant neoplasm of axilla and upper limb lymph nodes: Secondary | ICD-10-CM | POA: Diagnosis not present

## 2021-04-11 DIAGNOSIS — R21 Rash and other nonspecific skin eruption: Secondary | ICD-10-CM | POA: Diagnosis not present

## 2021-04-11 DIAGNOSIS — R0602 Shortness of breath: Secondary | ICD-10-CM | POA: Diagnosis not present

## 2021-04-11 DIAGNOSIS — C50919 Malignant neoplasm of unspecified site of unspecified female breast: Secondary | ICD-10-CM | POA: Diagnosis not present

## 2021-04-11 DIAGNOSIS — C50911 Malignant neoplasm of unspecified site of right female breast: Secondary | ICD-10-CM | POA: Diagnosis not present

## 2021-04-11 DIAGNOSIS — Z1502 Genetic susceptibility to malignant neoplasm of ovary: Secondary | ICD-10-CM | POA: Diagnosis not present

## 2021-04-11 DIAGNOSIS — Z1589 Genetic susceptibility to other disease: Secondary | ICD-10-CM | POA: Diagnosis not present

## 2021-04-11 DIAGNOSIS — Z170 Estrogen receptor positive status [ER+]: Secondary | ICD-10-CM | POA: Diagnosis not present

## 2021-04-12 DIAGNOSIS — R21 Rash and other nonspecific skin eruption: Secondary | ICD-10-CM | POA: Diagnosis not present

## 2021-04-12 DIAGNOSIS — C50911 Malignant neoplasm of unspecified site of right female breast: Secondary | ICD-10-CM | POA: Diagnosis not present

## 2021-04-12 DIAGNOSIS — Z1589 Genetic susceptibility to other disease: Secondary | ICD-10-CM | POA: Diagnosis not present

## 2021-04-12 DIAGNOSIS — E119 Type 2 diabetes mellitus without complications: Secondary | ICD-10-CM | POA: Diagnosis not present

## 2021-04-12 DIAGNOSIS — Z1502 Genetic susceptibility to malignant neoplasm of ovary: Secondary | ICD-10-CM | POA: Diagnosis not present

## 2021-04-12 DIAGNOSIS — Z170 Estrogen receptor positive status [ER+]: Secondary | ICD-10-CM | POA: Diagnosis not present

## 2021-04-12 DIAGNOSIS — C773 Secondary and unspecified malignant neoplasm of axilla and upper limb lymph nodes: Secondary | ICD-10-CM | POA: Diagnosis not present

## 2021-04-12 DIAGNOSIS — C50919 Malignant neoplasm of unspecified site of unspecified female breast: Secondary | ICD-10-CM | POA: Diagnosis not present

## 2021-04-12 DIAGNOSIS — I10 Essential (primary) hypertension: Secondary | ICD-10-CM | POA: Diagnosis not present

## 2021-04-12 DIAGNOSIS — C50811 Malignant neoplasm of overlapping sites of right female breast: Secondary | ICD-10-CM | POA: Diagnosis not present

## 2021-04-13 DIAGNOSIS — Z170 Estrogen receptor positive status [ER+]: Secondary | ICD-10-CM | POA: Diagnosis not present

## 2021-04-13 DIAGNOSIS — E119 Type 2 diabetes mellitus without complications: Secondary | ICD-10-CM | POA: Diagnosis not present

## 2021-04-13 DIAGNOSIS — Z1502 Genetic susceptibility to malignant neoplasm of ovary: Secondary | ICD-10-CM | POA: Diagnosis not present

## 2021-04-13 DIAGNOSIS — C50919 Malignant neoplasm of unspecified site of unspecified female breast: Secondary | ICD-10-CM | POA: Diagnosis not present

## 2021-04-13 DIAGNOSIS — I10 Essential (primary) hypertension: Secondary | ICD-10-CM | POA: Diagnosis not present

## 2021-04-13 DIAGNOSIS — C50911 Malignant neoplasm of unspecified site of right female breast: Secondary | ICD-10-CM | POA: Diagnosis not present

## 2021-04-13 DIAGNOSIS — Z1589 Genetic susceptibility to other disease: Secondary | ICD-10-CM | POA: Diagnosis not present

## 2021-04-13 DIAGNOSIS — C50811 Malignant neoplasm of overlapping sites of right female breast: Secondary | ICD-10-CM | POA: Diagnosis not present

## 2021-04-13 DIAGNOSIS — R21 Rash and other nonspecific skin eruption: Secondary | ICD-10-CM | POA: Diagnosis not present

## 2021-04-13 DIAGNOSIS — C773 Secondary and unspecified malignant neoplasm of axilla and upper limb lymph nodes: Secondary | ICD-10-CM | POA: Diagnosis not present

## 2021-04-16 DIAGNOSIS — Z170 Estrogen receptor positive status [ER+]: Secondary | ICD-10-CM | POA: Diagnosis not present

## 2021-04-16 DIAGNOSIS — I10 Essential (primary) hypertension: Secondary | ICD-10-CM | POA: Diagnosis not present

## 2021-04-16 DIAGNOSIS — C50811 Malignant neoplasm of overlapping sites of right female breast: Secondary | ICD-10-CM | POA: Diagnosis not present

## 2021-04-16 DIAGNOSIS — Z1502 Genetic susceptibility to malignant neoplasm of ovary: Secondary | ICD-10-CM | POA: Diagnosis not present

## 2021-04-16 DIAGNOSIS — R21 Rash and other nonspecific skin eruption: Secondary | ICD-10-CM | POA: Diagnosis not present

## 2021-04-16 DIAGNOSIS — C50919 Malignant neoplasm of unspecified site of unspecified female breast: Secondary | ICD-10-CM | POA: Diagnosis not present

## 2021-04-16 DIAGNOSIS — Z1589 Genetic susceptibility to other disease: Secondary | ICD-10-CM | POA: Diagnosis not present

## 2021-04-16 DIAGNOSIS — C773 Secondary and unspecified malignant neoplasm of axilla and upper limb lymph nodes: Secondary | ICD-10-CM | POA: Diagnosis not present

## 2021-04-16 DIAGNOSIS — E119 Type 2 diabetes mellitus without complications: Secondary | ICD-10-CM | POA: Diagnosis not present

## 2021-04-16 DIAGNOSIS — C50911 Malignant neoplasm of unspecified site of right female breast: Secondary | ICD-10-CM | POA: Diagnosis not present

## 2021-04-17 DIAGNOSIS — Z1502 Genetic susceptibility to malignant neoplasm of ovary: Secondary | ICD-10-CM | POA: Diagnosis not present

## 2021-04-17 DIAGNOSIS — L589 Radiodermatitis, unspecified: Secondary | ICD-10-CM | POA: Diagnosis not present

## 2021-04-17 DIAGNOSIS — Z09 Encounter for follow-up examination after completed treatment for conditions other than malignant neoplasm: Secondary | ICD-10-CM | POA: Diagnosis not present

## 2021-04-17 DIAGNOSIS — Z1589 Genetic susceptibility to other disease: Secondary | ICD-10-CM | POA: Diagnosis not present

## 2021-04-17 DIAGNOSIS — C50919 Malignant neoplasm of unspecified site of unspecified female breast: Secondary | ICD-10-CM | POA: Diagnosis not present

## 2021-04-17 DIAGNOSIS — C50911 Malignant neoplasm of unspecified site of right female breast: Secondary | ICD-10-CM | POA: Diagnosis not present

## 2021-04-17 DIAGNOSIS — I10 Essential (primary) hypertension: Secondary | ICD-10-CM | POA: Diagnosis not present

## 2021-04-17 DIAGNOSIS — C773 Secondary and unspecified malignant neoplasm of axilla and upper limb lymph nodes: Secondary | ICD-10-CM | POA: Diagnosis not present

## 2021-04-17 DIAGNOSIS — C50811 Malignant neoplasm of overlapping sites of right female breast: Secondary | ICD-10-CM | POA: Diagnosis not present

## 2021-04-17 DIAGNOSIS — Z9011 Acquired absence of right breast and nipple: Secondary | ICD-10-CM | POA: Diagnosis not present

## 2021-04-17 DIAGNOSIS — E119 Type 2 diabetes mellitus without complications: Secondary | ICD-10-CM | POA: Diagnosis not present

## 2021-04-17 DIAGNOSIS — Z1329 Encounter for screening for other suspected endocrine disorder: Secondary | ICD-10-CM | POA: Diagnosis not present

## 2021-04-17 DIAGNOSIS — Z1509 Genetic susceptibility to other malignant neoplasm: Secondary | ICD-10-CM | POA: Diagnosis not present

## 2021-04-17 DIAGNOSIS — R21 Rash and other nonspecific skin eruption: Secondary | ICD-10-CM | POA: Diagnosis not present

## 2021-04-17 DIAGNOSIS — Z170 Estrogen receptor positive status [ER+]: Secondary | ICD-10-CM | POA: Diagnosis not present

## 2021-04-17 DIAGNOSIS — Z79811 Long term (current) use of aromatase inhibitors: Secondary | ICD-10-CM | POA: Diagnosis not present

## 2021-04-18 DIAGNOSIS — Z1502 Genetic susceptibility to malignant neoplasm of ovary: Secondary | ICD-10-CM | POA: Diagnosis not present

## 2021-04-18 DIAGNOSIS — C50919 Malignant neoplasm of unspecified site of unspecified female breast: Secondary | ICD-10-CM | POA: Diagnosis not present

## 2021-04-18 DIAGNOSIS — Z1589 Genetic susceptibility to other disease: Secondary | ICD-10-CM | POA: Diagnosis not present

## 2021-04-18 DIAGNOSIS — Z5112 Encounter for antineoplastic immunotherapy: Secondary | ICD-10-CM | POA: Diagnosis not present

## 2021-04-18 DIAGNOSIS — Z1509 Genetic susceptibility to other malignant neoplasm: Secondary | ICD-10-CM | POA: Diagnosis not present

## 2021-04-19 DIAGNOSIS — R21 Rash and other nonspecific skin eruption: Secondary | ICD-10-CM | POA: Diagnosis not present

## 2021-04-19 DIAGNOSIS — C773 Secondary and unspecified malignant neoplasm of axilla and upper limb lymph nodes: Secondary | ICD-10-CM | POA: Diagnosis not present

## 2021-04-19 DIAGNOSIS — I10 Essential (primary) hypertension: Secondary | ICD-10-CM | POA: Diagnosis not present

## 2021-04-19 DIAGNOSIS — E119 Type 2 diabetes mellitus without complications: Secondary | ICD-10-CM | POA: Diagnosis not present

## 2021-04-19 DIAGNOSIS — Z170 Estrogen receptor positive status [ER+]: Secondary | ICD-10-CM | POA: Diagnosis not present

## 2021-04-19 DIAGNOSIS — C50811 Malignant neoplasm of overlapping sites of right female breast: Secondary | ICD-10-CM | POA: Diagnosis not present

## 2021-04-19 DIAGNOSIS — C50911 Malignant neoplasm of unspecified site of right female breast: Secondary | ICD-10-CM | POA: Diagnosis not present

## 2021-04-19 DIAGNOSIS — Z1589 Genetic susceptibility to other disease: Secondary | ICD-10-CM | POA: Diagnosis not present

## 2021-04-19 DIAGNOSIS — Z1502 Genetic susceptibility to malignant neoplasm of ovary: Secondary | ICD-10-CM | POA: Diagnosis not present

## 2021-04-19 DIAGNOSIS — C50919 Malignant neoplasm of unspecified site of unspecified female breast: Secondary | ICD-10-CM | POA: Diagnosis not present

## 2021-04-20 DIAGNOSIS — C50911 Malignant neoplasm of unspecified site of right female breast: Secondary | ICD-10-CM | POA: Diagnosis not present

## 2021-04-20 DIAGNOSIS — C773 Secondary and unspecified malignant neoplasm of axilla and upper limb lymph nodes: Secondary | ICD-10-CM | POA: Diagnosis not present

## 2021-04-20 DIAGNOSIS — Z1589 Genetic susceptibility to other disease: Secondary | ICD-10-CM | POA: Diagnosis not present

## 2021-04-20 DIAGNOSIS — R21 Rash and other nonspecific skin eruption: Secondary | ICD-10-CM | POA: Diagnosis not present

## 2021-04-20 DIAGNOSIS — I10 Essential (primary) hypertension: Secondary | ICD-10-CM | POA: Diagnosis not present

## 2021-04-20 DIAGNOSIS — Z170 Estrogen receptor positive status [ER+]: Secondary | ICD-10-CM | POA: Diagnosis not present

## 2021-04-20 DIAGNOSIS — E119 Type 2 diabetes mellitus without complications: Secondary | ICD-10-CM | POA: Diagnosis not present

## 2021-04-20 DIAGNOSIS — C50811 Malignant neoplasm of overlapping sites of right female breast: Secondary | ICD-10-CM | POA: Diagnosis not present

## 2021-04-20 DIAGNOSIS — C50919 Malignant neoplasm of unspecified site of unspecified female breast: Secondary | ICD-10-CM | POA: Diagnosis not present

## 2021-04-20 DIAGNOSIS — Z1502 Genetic susceptibility to malignant neoplasm of ovary: Secondary | ICD-10-CM | POA: Diagnosis not present

## 2021-04-23 DIAGNOSIS — C773 Secondary and unspecified malignant neoplasm of axilla and upper limb lymph nodes: Secondary | ICD-10-CM | POA: Diagnosis not present

## 2021-04-23 DIAGNOSIS — Z170 Estrogen receptor positive status [ER+]: Secondary | ICD-10-CM | POA: Diagnosis not present

## 2021-04-23 DIAGNOSIS — C50911 Malignant neoplasm of unspecified site of right female breast: Secondary | ICD-10-CM | POA: Diagnosis not present

## 2021-04-23 DIAGNOSIS — C50919 Malignant neoplasm of unspecified site of unspecified female breast: Secondary | ICD-10-CM | POA: Diagnosis not present

## 2021-04-23 DIAGNOSIS — E119 Type 2 diabetes mellitus without complications: Secondary | ICD-10-CM | POA: Diagnosis not present

## 2021-04-23 DIAGNOSIS — R21 Rash and other nonspecific skin eruption: Secondary | ICD-10-CM | POA: Diagnosis not present

## 2021-04-23 DIAGNOSIS — Z1502 Genetic susceptibility to malignant neoplasm of ovary: Secondary | ICD-10-CM | POA: Diagnosis not present

## 2021-04-23 DIAGNOSIS — I10 Essential (primary) hypertension: Secondary | ICD-10-CM | POA: Diagnosis not present

## 2021-04-23 DIAGNOSIS — Z1589 Genetic susceptibility to other disease: Secondary | ICD-10-CM | POA: Diagnosis not present

## 2021-04-26 DIAGNOSIS — E119 Type 2 diabetes mellitus without complications: Secondary | ICD-10-CM | POA: Diagnosis not present

## 2021-04-26 DIAGNOSIS — C50919 Malignant neoplasm of unspecified site of unspecified female breast: Secondary | ICD-10-CM | POA: Diagnosis not present

## 2021-04-26 DIAGNOSIS — Z516 Encounter for desensitization to allergens: Secondary | ICD-10-CM | POA: Diagnosis not present

## 2021-04-26 DIAGNOSIS — R21 Rash and other nonspecific skin eruption: Secondary | ICD-10-CM | POA: Diagnosis not present

## 2021-04-26 DIAGNOSIS — C50911 Malignant neoplasm of unspecified site of right female breast: Secondary | ICD-10-CM | POA: Diagnosis not present

## 2021-04-26 DIAGNOSIS — I10 Essential (primary) hypertension: Secondary | ICD-10-CM | POA: Diagnosis not present

## 2021-04-26 DIAGNOSIS — Z170 Estrogen receptor positive status [ER+]: Secondary | ICD-10-CM | POA: Diagnosis not present

## 2021-04-26 DIAGNOSIS — Z1589 Genetic susceptibility to other disease: Secondary | ICD-10-CM | POA: Diagnosis not present

## 2021-04-26 DIAGNOSIS — Z1502 Genetic susceptibility to malignant neoplasm of ovary: Secondary | ICD-10-CM | POA: Diagnosis not present

## 2021-04-26 DIAGNOSIS — C773 Secondary and unspecified malignant neoplasm of axilla and upper limb lymph nodes: Secondary | ICD-10-CM | POA: Diagnosis not present

## 2021-04-27 DIAGNOSIS — Z79811 Long term (current) use of aromatase inhibitors: Secondary | ICD-10-CM | POA: Insufficient documentation

## 2021-04-30 DIAGNOSIS — C50911 Malignant neoplasm of unspecified site of right female breast: Secondary | ICD-10-CM | POA: Diagnosis not present

## 2021-04-30 DIAGNOSIS — R21 Rash and other nonspecific skin eruption: Secondary | ICD-10-CM | POA: Diagnosis not present

## 2021-04-30 DIAGNOSIS — Z1502 Genetic susceptibility to malignant neoplasm of ovary: Secondary | ICD-10-CM | POA: Diagnosis not present

## 2021-04-30 DIAGNOSIS — Z170 Estrogen receptor positive status [ER+]: Secondary | ICD-10-CM | POA: Diagnosis not present

## 2021-04-30 DIAGNOSIS — I10 Essential (primary) hypertension: Secondary | ICD-10-CM | POA: Diagnosis not present

## 2021-04-30 DIAGNOSIS — Z1589 Genetic susceptibility to other disease: Secondary | ICD-10-CM | POA: Diagnosis not present

## 2021-04-30 DIAGNOSIS — C50811 Malignant neoplasm of overlapping sites of right female breast: Secondary | ICD-10-CM | POA: Diagnosis not present

## 2021-04-30 DIAGNOSIS — C773 Secondary and unspecified malignant neoplasm of axilla and upper limb lymph nodes: Secondary | ICD-10-CM | POA: Diagnosis not present

## 2021-04-30 DIAGNOSIS — E119 Type 2 diabetes mellitus without complications: Secondary | ICD-10-CM | POA: Diagnosis not present

## 2021-04-30 DIAGNOSIS — C50919 Malignant neoplasm of unspecified site of unspecified female breast: Secondary | ICD-10-CM | POA: Diagnosis not present

## 2021-05-01 ENCOUNTER — Ambulatory Visit: Payer: Medicare PPO | Admitting: Physician Assistant

## 2021-05-01 DIAGNOSIS — C50919 Malignant neoplasm of unspecified site of unspecified female breast: Secondary | ICD-10-CM | POA: Diagnosis not present

## 2021-05-01 DIAGNOSIS — Z170 Estrogen receptor positive status [ER+]: Secondary | ICD-10-CM | POA: Diagnosis not present

## 2021-05-01 DIAGNOSIS — C50911 Malignant neoplasm of unspecified site of right female breast: Secondary | ICD-10-CM | POA: Diagnosis not present

## 2021-05-01 DIAGNOSIS — Z1502 Genetic susceptibility to malignant neoplasm of ovary: Secondary | ICD-10-CM | POA: Diagnosis not present

## 2021-05-01 DIAGNOSIS — E119 Type 2 diabetes mellitus without complications: Secondary | ICD-10-CM | POA: Diagnosis not present

## 2021-05-01 DIAGNOSIS — I10 Essential (primary) hypertension: Secondary | ICD-10-CM | POA: Diagnosis not present

## 2021-05-01 DIAGNOSIS — R21 Rash and other nonspecific skin eruption: Secondary | ICD-10-CM | POA: Diagnosis not present

## 2021-05-01 DIAGNOSIS — C773 Secondary and unspecified malignant neoplasm of axilla and upper limb lymph nodes: Secondary | ICD-10-CM | POA: Diagnosis not present

## 2021-05-01 DIAGNOSIS — Z1589 Genetic susceptibility to other disease: Secondary | ICD-10-CM | POA: Diagnosis not present

## 2021-05-02 DIAGNOSIS — E119 Type 2 diabetes mellitus without complications: Secondary | ICD-10-CM | POA: Diagnosis not present

## 2021-05-02 DIAGNOSIS — C50919 Malignant neoplasm of unspecified site of unspecified female breast: Secondary | ICD-10-CM | POA: Diagnosis not present

## 2021-05-02 DIAGNOSIS — R21 Rash and other nonspecific skin eruption: Secondary | ICD-10-CM | POA: Diagnosis not present

## 2021-05-02 DIAGNOSIS — Z1589 Genetic susceptibility to other disease: Secondary | ICD-10-CM | POA: Diagnosis not present

## 2021-05-02 DIAGNOSIS — C50911 Malignant neoplasm of unspecified site of right female breast: Secondary | ICD-10-CM | POA: Diagnosis not present

## 2021-05-02 DIAGNOSIS — Z1502 Genetic susceptibility to malignant neoplasm of ovary: Secondary | ICD-10-CM | POA: Diagnosis not present

## 2021-05-02 DIAGNOSIS — C773 Secondary and unspecified malignant neoplasm of axilla and upper limb lymph nodes: Secondary | ICD-10-CM | POA: Diagnosis not present

## 2021-05-02 DIAGNOSIS — Z170 Estrogen receptor positive status [ER+]: Secondary | ICD-10-CM | POA: Diagnosis not present

## 2021-05-02 DIAGNOSIS — I10 Essential (primary) hypertension: Secondary | ICD-10-CM | POA: Diagnosis not present

## 2021-05-03 DIAGNOSIS — E119 Type 2 diabetes mellitus without complications: Secondary | ICD-10-CM | POA: Diagnosis not present

## 2021-05-03 DIAGNOSIS — R262 Difficulty in walking, not elsewhere classified: Secondary | ICD-10-CM | POA: Diagnosis not present

## 2021-05-03 DIAGNOSIS — Z1589 Genetic susceptibility to other disease: Secondary | ICD-10-CM | POA: Diagnosis not present

## 2021-05-03 DIAGNOSIS — I10 Essential (primary) hypertension: Secondary | ICD-10-CM | POA: Diagnosis not present

## 2021-05-03 DIAGNOSIS — M25611 Stiffness of right shoulder, not elsewhere classified: Secondary | ICD-10-CM | POA: Diagnosis not present

## 2021-05-03 DIAGNOSIS — Z9889 Other specified postprocedural states: Secondary | ICD-10-CM | POA: Diagnosis not present

## 2021-05-03 DIAGNOSIS — C50911 Malignant neoplasm of unspecified site of right female breast: Secondary | ICD-10-CM | POA: Diagnosis not present

## 2021-05-03 DIAGNOSIS — Z170 Estrogen receptor positive status [ER+]: Secondary | ICD-10-CM | POA: Diagnosis not present

## 2021-05-03 DIAGNOSIS — Z1509 Genetic susceptibility to other malignant neoplasm: Secondary | ICD-10-CM | POA: Diagnosis not present

## 2021-05-03 DIAGNOSIS — R21 Rash and other nonspecific skin eruption: Secondary | ICD-10-CM | POA: Diagnosis not present

## 2021-05-03 DIAGNOSIS — R531 Weakness: Secondary | ICD-10-CM | POA: Diagnosis not present

## 2021-05-03 DIAGNOSIS — C50919 Malignant neoplasm of unspecified site of unspecified female breast: Secondary | ICD-10-CM | POA: Diagnosis not present

## 2021-05-03 DIAGNOSIS — Z1502 Genetic susceptibility to malignant neoplasm of ovary: Secondary | ICD-10-CM | POA: Diagnosis not present

## 2021-05-03 DIAGNOSIS — C773 Secondary and unspecified malignant neoplasm of axilla and upper limb lymph nodes: Secondary | ICD-10-CM | POA: Diagnosis not present

## 2021-05-04 DIAGNOSIS — I10 Essential (primary) hypertension: Secondary | ICD-10-CM | POA: Diagnosis not present

## 2021-05-04 DIAGNOSIS — F419 Anxiety disorder, unspecified: Secondary | ICD-10-CM | POA: Diagnosis not present

## 2021-05-04 DIAGNOSIS — C50911 Malignant neoplasm of unspecified site of right female breast: Secondary | ICD-10-CM | POA: Diagnosis not present

## 2021-05-04 DIAGNOSIS — Z9889 Other specified postprocedural states: Secondary | ICD-10-CM | POA: Diagnosis not present

## 2021-05-04 DIAGNOSIS — C773 Secondary and unspecified malignant neoplasm of axilla and upper limb lymph nodes: Secondary | ICD-10-CM | POA: Diagnosis not present

## 2021-05-04 DIAGNOSIS — R262 Difficulty in walking, not elsewhere classified: Secondary | ICD-10-CM | POA: Diagnosis not present

## 2021-05-04 DIAGNOSIS — Z170 Estrogen receptor positive status [ER+]: Secondary | ICD-10-CM | POA: Diagnosis not present

## 2021-05-04 DIAGNOSIS — Z1589 Genetic susceptibility to other disease: Secondary | ICD-10-CM | POA: Diagnosis not present

## 2021-05-04 DIAGNOSIS — M25611 Stiffness of right shoulder, not elsewhere classified: Secondary | ICD-10-CM | POA: Diagnosis not present

## 2021-05-04 DIAGNOSIS — C50919 Malignant neoplasm of unspecified site of unspecified female breast: Secondary | ICD-10-CM | POA: Diagnosis not present

## 2021-05-04 DIAGNOSIS — Z1509 Genetic susceptibility to other malignant neoplasm: Secondary | ICD-10-CM | POA: Diagnosis not present

## 2021-05-04 DIAGNOSIS — E119 Type 2 diabetes mellitus without complications: Secondary | ICD-10-CM | POA: Diagnosis not present

## 2021-05-04 DIAGNOSIS — R21 Rash and other nonspecific skin eruption: Secondary | ICD-10-CM | POA: Diagnosis not present

## 2021-05-04 DIAGNOSIS — R7303 Prediabetes: Secondary | ICD-10-CM | POA: Diagnosis not present

## 2021-05-04 DIAGNOSIS — Z1502 Genetic susceptibility to malignant neoplasm of ovary: Secondary | ICD-10-CM | POA: Diagnosis not present

## 2021-05-04 DIAGNOSIS — R531 Weakness: Secondary | ICD-10-CM | POA: Diagnosis not present

## 2021-05-08 DIAGNOSIS — Z1329 Encounter for screening for other suspected endocrine disorder: Secondary | ICD-10-CM | POA: Diagnosis not present

## 2021-05-08 DIAGNOSIS — Z1502 Genetic susceptibility to malignant neoplasm of ovary: Secondary | ICD-10-CM | POA: Diagnosis not present

## 2021-05-08 DIAGNOSIS — Z1589 Genetic susceptibility to other disease: Secondary | ICD-10-CM | POA: Diagnosis not present

## 2021-05-08 DIAGNOSIS — G4733 Obstructive sleep apnea (adult) (pediatric): Secondary | ICD-10-CM | POA: Diagnosis not present

## 2021-05-08 DIAGNOSIS — R21 Rash and other nonspecific skin eruption: Secondary | ICD-10-CM | POA: Diagnosis not present

## 2021-05-08 DIAGNOSIS — C50919 Malignant neoplasm of unspecified site of unspecified female breast: Secondary | ICD-10-CM | POA: Diagnosis not present

## 2021-05-08 DIAGNOSIS — Z1509 Genetic susceptibility to other malignant neoplasm: Secondary | ICD-10-CM | POA: Diagnosis not present

## 2021-05-08 DIAGNOSIS — Z09 Encounter for follow-up examination after completed treatment for conditions other than malignant neoplasm: Secondary | ICD-10-CM | POA: Diagnosis not present

## 2021-05-09 DIAGNOSIS — Z95828 Presence of other vascular implants and grafts: Secondary | ICD-10-CM | POA: Diagnosis not present

## 2021-05-09 DIAGNOSIS — C50919 Malignant neoplasm of unspecified site of unspecified female breast: Secondary | ICD-10-CM | POA: Diagnosis not present

## 2021-05-09 DIAGNOSIS — Z1589 Genetic susceptibility to other disease: Secondary | ICD-10-CM | POA: Diagnosis not present

## 2021-05-09 DIAGNOSIS — Z5111 Encounter for antineoplastic chemotherapy: Secondary | ICD-10-CM | POA: Diagnosis not present

## 2021-05-09 DIAGNOSIS — Z1502 Genetic susceptibility to malignant neoplasm of ovary: Secondary | ICD-10-CM | POA: Diagnosis not present

## 2021-05-09 DIAGNOSIS — Z1509 Genetic susceptibility to other malignant neoplasm: Secondary | ICD-10-CM | POA: Diagnosis not present

## 2021-05-11 DIAGNOSIS — Z1589 Genetic susceptibility to other disease: Secondary | ICD-10-CM | POA: Diagnosis not present

## 2021-05-11 DIAGNOSIS — C50911 Malignant neoplasm of unspecified site of right female breast: Secondary | ICD-10-CM | POA: Diagnosis not present

## 2021-05-11 DIAGNOSIS — C773 Secondary and unspecified malignant neoplasm of axilla and upper limb lymph nodes: Secondary | ICD-10-CM | POA: Diagnosis not present

## 2021-05-11 DIAGNOSIS — M25611 Stiffness of right shoulder, not elsewhere classified: Secondary | ICD-10-CM | POA: Diagnosis not present

## 2021-05-11 DIAGNOSIS — Z1509 Genetic susceptibility to other malignant neoplasm: Secondary | ICD-10-CM | POA: Diagnosis not present

## 2021-05-11 DIAGNOSIS — I10 Essential (primary) hypertension: Secondary | ICD-10-CM | POA: Diagnosis not present

## 2021-05-11 DIAGNOSIS — Z170 Estrogen receptor positive status [ER+]: Secondary | ICD-10-CM | POA: Diagnosis not present

## 2021-05-11 DIAGNOSIS — Z9889 Other specified postprocedural states: Secondary | ICD-10-CM | POA: Diagnosis not present

## 2021-05-11 DIAGNOSIS — E119 Type 2 diabetes mellitus without complications: Secondary | ICD-10-CM | POA: Diagnosis not present

## 2021-05-11 DIAGNOSIS — Z1502 Genetic susceptibility to malignant neoplasm of ovary: Secondary | ICD-10-CM | POA: Diagnosis not present

## 2021-05-11 DIAGNOSIS — C50919 Malignant neoplasm of unspecified site of unspecified female breast: Secondary | ICD-10-CM | POA: Diagnosis not present

## 2021-05-11 DIAGNOSIS — R531 Weakness: Secondary | ICD-10-CM | POA: Diagnosis not present

## 2021-05-11 DIAGNOSIS — R262 Difficulty in walking, not elsewhere classified: Secondary | ICD-10-CM | POA: Diagnosis not present

## 2021-05-22 DIAGNOSIS — R262 Difficulty in walking, not elsewhere classified: Secondary | ICD-10-CM | POA: Diagnosis not present

## 2021-05-22 DIAGNOSIS — Z1502 Genetic susceptibility to malignant neoplasm of ovary: Secondary | ICD-10-CM | POA: Diagnosis not present

## 2021-05-22 DIAGNOSIS — Z1589 Genetic susceptibility to other disease: Secondary | ICD-10-CM | POA: Diagnosis not present

## 2021-05-22 DIAGNOSIS — Z23 Encounter for immunization: Secondary | ICD-10-CM | POA: Diagnosis not present

## 2021-05-22 DIAGNOSIS — M25611 Stiffness of right shoulder, not elsewhere classified: Secondary | ICD-10-CM | POA: Diagnosis not present

## 2021-05-22 DIAGNOSIS — Z9889 Other specified postprocedural states: Secondary | ICD-10-CM | POA: Diagnosis not present

## 2021-05-22 DIAGNOSIS — C50919 Malignant neoplasm of unspecified site of unspecified female breast: Secondary | ICD-10-CM | POA: Diagnosis not present

## 2021-05-22 DIAGNOSIS — R531 Weakness: Secondary | ICD-10-CM | POA: Diagnosis not present

## 2021-05-22 DIAGNOSIS — Z1509 Genetic susceptibility to other malignant neoplasm: Secondary | ICD-10-CM | POA: Diagnosis not present

## 2021-05-24 DIAGNOSIS — R262 Difficulty in walking, not elsewhere classified: Secondary | ICD-10-CM | POA: Diagnosis not present

## 2021-05-24 DIAGNOSIS — Z1589 Genetic susceptibility to other disease: Secondary | ICD-10-CM | POA: Diagnosis not present

## 2021-05-24 DIAGNOSIS — Z1502 Genetic susceptibility to malignant neoplasm of ovary: Secondary | ICD-10-CM | POA: Diagnosis not present

## 2021-05-24 DIAGNOSIS — Z1509 Genetic susceptibility to other malignant neoplasm: Secondary | ICD-10-CM | POA: Diagnosis not present

## 2021-05-24 DIAGNOSIS — C50919 Malignant neoplasm of unspecified site of unspecified female breast: Secondary | ICD-10-CM | POA: Diagnosis not present

## 2021-05-24 DIAGNOSIS — Z9889 Other specified postprocedural states: Secondary | ICD-10-CM | POA: Diagnosis not present

## 2021-05-24 DIAGNOSIS — M25611 Stiffness of right shoulder, not elsewhere classified: Secondary | ICD-10-CM | POA: Diagnosis not present

## 2021-05-24 DIAGNOSIS — R531 Weakness: Secondary | ICD-10-CM | POA: Diagnosis not present

## 2021-05-27 ENCOUNTER — Other Ambulatory Visit: Payer: Self-pay | Admitting: Cardiology

## 2021-05-28 DIAGNOSIS — J4550 Severe persistent asthma, uncomplicated: Secondary | ICD-10-CM | POA: Diagnosis not present

## 2021-05-28 DIAGNOSIS — Z516 Encounter for desensitization to allergens: Secondary | ICD-10-CM | POA: Diagnosis not present

## 2021-05-28 DIAGNOSIS — J4542 Moderate persistent asthma with status asthmaticus: Secondary | ICD-10-CM | POA: Diagnosis not present

## 2021-05-29 DIAGNOSIS — Z1502 Genetic susceptibility to malignant neoplasm of ovary: Secondary | ICD-10-CM | POA: Diagnosis not present

## 2021-05-29 DIAGNOSIS — Z1509 Genetic susceptibility to other malignant neoplasm: Secondary | ICD-10-CM | POA: Diagnosis not present

## 2021-05-29 DIAGNOSIS — Z09 Encounter for follow-up examination after completed treatment for conditions other than malignant neoplasm: Secondary | ICD-10-CM | POA: Diagnosis not present

## 2021-05-29 DIAGNOSIS — R21 Rash and other nonspecific skin eruption: Secondary | ICD-10-CM | POA: Diagnosis not present

## 2021-05-29 DIAGNOSIS — Z1589 Genetic susceptibility to other disease: Secondary | ICD-10-CM | POA: Diagnosis not present

## 2021-05-29 DIAGNOSIS — F419 Anxiety disorder, unspecified: Secondary | ICD-10-CM | POA: Diagnosis not present

## 2021-05-29 DIAGNOSIS — C50919 Malignant neoplasm of unspecified site of unspecified female breast: Secondary | ICD-10-CM | POA: Diagnosis not present

## 2021-05-29 DIAGNOSIS — Z1329 Encounter for screening for other suspected endocrine disorder: Secondary | ICD-10-CM | POA: Diagnosis not present

## 2021-05-29 NOTE — Telephone Encounter (Signed)
Prescription refill request for Xarelto received.  Indication: afib  Last office visit: 08/10/2020, [place] Weight: 104 kg  Age: 75 yo  Scr: 0.77, 05/08/2021 CrCl: 105 ml/min   Refill sent.

## 2021-05-30 DIAGNOSIS — Z5111 Encounter for antineoplastic chemotherapy: Secondary | ICD-10-CM | POA: Diagnosis not present

## 2021-05-30 DIAGNOSIS — C50919 Malignant neoplasm of unspecified site of unspecified female breast: Secondary | ICD-10-CM | POA: Diagnosis not present

## 2021-05-30 DIAGNOSIS — Z1502 Genetic susceptibility to malignant neoplasm of ovary: Secondary | ICD-10-CM | POA: Diagnosis not present

## 2021-05-30 DIAGNOSIS — Z1589 Genetic susceptibility to other disease: Secondary | ICD-10-CM | POA: Diagnosis not present

## 2021-05-30 DIAGNOSIS — Z1509 Genetic susceptibility to other malignant neoplasm: Secondary | ICD-10-CM | POA: Diagnosis not present

## 2021-06-04 DIAGNOSIS — R0602 Shortness of breath: Secondary | ICD-10-CM | POA: Diagnosis not present

## 2021-06-04 DIAGNOSIS — Z1509 Genetic susceptibility to other malignant neoplasm: Secondary | ICD-10-CM | POA: Diagnosis not present

## 2021-06-04 DIAGNOSIS — Z1589 Genetic susceptibility to other disease: Secondary | ICD-10-CM | POA: Diagnosis not present

## 2021-06-04 DIAGNOSIS — Z1502 Genetic susceptibility to malignant neoplasm of ovary: Secondary | ICD-10-CM | POA: Diagnosis not present

## 2021-06-04 DIAGNOSIS — R29898 Other symptoms and signs involving the musculoskeletal system: Secondary | ICD-10-CM | POA: Diagnosis not present

## 2021-06-04 DIAGNOSIS — M6281 Muscle weakness (generalized): Secondary | ICD-10-CM | POA: Diagnosis not present

## 2021-06-04 DIAGNOSIS — Z9181 History of falling: Secondary | ICD-10-CM | POA: Diagnosis not present

## 2021-06-04 DIAGNOSIS — M25611 Stiffness of right shoulder, not elsewhere classified: Secondary | ICD-10-CM | POA: Diagnosis not present

## 2021-06-04 DIAGNOSIS — C50919 Malignant neoplasm of unspecified site of unspecified female breast: Secondary | ICD-10-CM | POA: Diagnosis not present

## 2021-06-05 DIAGNOSIS — F411 Generalized anxiety disorder: Secondary | ICD-10-CM | POA: Diagnosis not present

## 2021-06-05 DIAGNOSIS — R7303 Prediabetes: Secondary | ICD-10-CM | POA: Diagnosis not present

## 2021-06-05 DIAGNOSIS — J4542 Moderate persistent asthma with status asthmaticus: Secondary | ICD-10-CM | POA: Diagnosis not present

## 2021-06-05 DIAGNOSIS — J45909 Unspecified asthma, uncomplicated: Secondary | ICD-10-CM | POA: Diagnosis not present

## 2021-06-05 DIAGNOSIS — I10 Essential (primary) hypertension: Secondary | ICD-10-CM | POA: Diagnosis not present

## 2021-06-05 DIAGNOSIS — C50919 Malignant neoplasm of unspecified site of unspecified female breast: Secondary | ICD-10-CM | POA: Diagnosis not present

## 2021-06-08 DIAGNOSIS — R29898 Other symptoms and signs involving the musculoskeletal system: Secondary | ICD-10-CM | POA: Diagnosis not present

## 2021-06-08 DIAGNOSIS — Z1502 Genetic susceptibility to malignant neoplasm of ovary: Secondary | ICD-10-CM | POA: Diagnosis not present

## 2021-06-08 DIAGNOSIS — Z9181 History of falling: Secondary | ICD-10-CM | POA: Diagnosis not present

## 2021-06-08 DIAGNOSIS — R0602 Shortness of breath: Secondary | ICD-10-CM | POA: Diagnosis not present

## 2021-06-08 DIAGNOSIS — C50919 Malignant neoplasm of unspecified site of unspecified female breast: Secondary | ICD-10-CM | POA: Diagnosis not present

## 2021-06-08 DIAGNOSIS — Z1589 Genetic susceptibility to other disease: Secondary | ICD-10-CM | POA: Diagnosis not present

## 2021-06-08 DIAGNOSIS — M25611 Stiffness of right shoulder, not elsewhere classified: Secondary | ICD-10-CM | POA: Diagnosis not present

## 2021-06-08 DIAGNOSIS — Z1509 Genetic susceptibility to other malignant neoplasm: Secondary | ICD-10-CM | POA: Diagnosis not present

## 2021-06-08 DIAGNOSIS — M6281 Muscle weakness (generalized): Secondary | ICD-10-CM | POA: Diagnosis not present

## 2021-06-11 DIAGNOSIS — R0602 Shortness of breath: Secondary | ICD-10-CM | POA: Diagnosis not present

## 2021-06-11 DIAGNOSIS — Z1502 Genetic susceptibility to malignant neoplasm of ovary: Secondary | ICD-10-CM | POA: Diagnosis not present

## 2021-06-11 DIAGNOSIS — M25611 Stiffness of right shoulder, not elsewhere classified: Secondary | ICD-10-CM | POA: Diagnosis not present

## 2021-06-11 DIAGNOSIS — Z9181 History of falling: Secondary | ICD-10-CM | POA: Diagnosis not present

## 2021-06-11 DIAGNOSIS — Z1589 Genetic susceptibility to other disease: Secondary | ICD-10-CM | POA: Diagnosis not present

## 2021-06-11 DIAGNOSIS — R29898 Other symptoms and signs involving the musculoskeletal system: Secondary | ICD-10-CM | POA: Diagnosis not present

## 2021-06-11 DIAGNOSIS — C50919 Malignant neoplasm of unspecified site of unspecified female breast: Secondary | ICD-10-CM | POA: Diagnosis not present

## 2021-06-11 DIAGNOSIS — M6281 Muscle weakness (generalized): Secondary | ICD-10-CM | POA: Diagnosis not present

## 2021-06-11 DIAGNOSIS — Z1509 Genetic susceptibility to other malignant neoplasm: Secondary | ICD-10-CM | POA: Diagnosis not present

## 2021-06-12 ENCOUNTER — Telehealth: Payer: Self-pay | Admitting: *Deleted

## 2021-06-12 NOTE — Telephone Encounter (Signed)
Phone call to patient to inform her that we don't have any openings before January.  Advised patient she could call daily to see if there's been any cancellations.

## 2021-06-12 NOTE — Telephone Encounter (Signed)
Left message for patient to return our phone call in regards to getting patient appointment- per [facility] [place].

## 2021-06-12 NOTE — Telephone Encounter (Signed)
Patient left message on office voice mail that she was returning phone call.

## 2021-06-13 DIAGNOSIS — I10 Essential (primary) hypertension: Secondary | ICD-10-CM | POA: Diagnosis not present

## 2021-06-13 DIAGNOSIS — E119 Type 2 diabetes mellitus without complications: Secondary | ICD-10-CM | POA: Diagnosis not present

## 2021-06-13 DIAGNOSIS — C773 Secondary and unspecified malignant neoplasm of axilla and upper limb lymph nodes: Secondary | ICD-10-CM | POA: Diagnosis not present

## 2021-06-13 DIAGNOSIS — Z170 Estrogen receptor positive status [ER+]: Secondary | ICD-10-CM | POA: Diagnosis not present

## 2021-06-13 DIAGNOSIS — C50911 Malignant neoplasm of unspecified site of right female breast: Secondary | ICD-10-CM | POA: Diagnosis not present

## 2021-06-18 DIAGNOSIS — C50919 Malignant neoplasm of unspecified site of unspecified female breast: Secondary | ICD-10-CM | POA: Diagnosis not present

## 2021-06-18 DIAGNOSIS — M25611 Stiffness of right shoulder, not elsewhere classified: Secondary | ICD-10-CM | POA: Diagnosis not present

## 2021-06-18 DIAGNOSIS — Z9181 History of falling: Secondary | ICD-10-CM | POA: Diagnosis not present

## 2021-06-18 DIAGNOSIS — Z1509 Genetic susceptibility to other malignant neoplasm: Secondary | ICD-10-CM | POA: Diagnosis not present

## 2021-06-18 DIAGNOSIS — R0602 Shortness of breath: Secondary | ICD-10-CM | POA: Diagnosis not present

## 2021-06-18 DIAGNOSIS — Z1502 Genetic susceptibility to malignant neoplasm of ovary: Secondary | ICD-10-CM | POA: Diagnosis not present

## 2021-06-18 DIAGNOSIS — M6281 Muscle weakness (generalized): Secondary | ICD-10-CM | POA: Diagnosis not present

## 2021-06-18 DIAGNOSIS — Z1589 Genetic susceptibility to other disease: Secondary | ICD-10-CM | POA: Diagnosis not present

## 2021-06-18 DIAGNOSIS — R29898 Other symptoms and signs involving the musculoskeletal system: Secondary | ICD-10-CM | POA: Diagnosis not present

## 2021-06-18 DIAGNOSIS — I517 Cardiomegaly: Secondary | ICD-10-CM | POA: Diagnosis not present

## 2021-06-19 DIAGNOSIS — Z1329 Encounter for screening for other suspected endocrine disorder: Secondary | ICD-10-CM | POA: Diagnosis not present

## 2021-06-19 DIAGNOSIS — Z1589 Genetic susceptibility to other disease: Secondary | ICD-10-CM | POA: Diagnosis not present

## 2021-06-19 DIAGNOSIS — C50919 Malignant neoplasm of unspecified site of unspecified female breast: Secondary | ICD-10-CM | POA: Diagnosis not present

## 2021-06-19 DIAGNOSIS — Z09 Encounter for follow-up examination after completed treatment for conditions other than malignant neoplasm: Secondary | ICD-10-CM | POA: Diagnosis not present

## 2021-06-19 DIAGNOSIS — R946 Abnormal results of thyroid function studies: Secondary | ICD-10-CM | POA: Diagnosis not present

## 2021-06-19 DIAGNOSIS — Z1509 Genetic susceptibility to other malignant neoplasm: Secondary | ICD-10-CM | POA: Diagnosis not present

## 2021-06-19 DIAGNOSIS — Z1502 Genetic susceptibility to malignant neoplasm of ovary: Secondary | ICD-10-CM | POA: Diagnosis not present

## 2021-06-20 DIAGNOSIS — C50919 Malignant neoplasm of unspecified site of unspecified female breast: Secondary | ICD-10-CM | POA: Diagnosis not present

## 2021-06-20 DIAGNOSIS — Z95828 Presence of other vascular implants and grafts: Secondary | ICD-10-CM | POA: Diagnosis not present

## 2021-06-20 DIAGNOSIS — Z1589 Genetic susceptibility to other disease: Secondary | ICD-10-CM | POA: Diagnosis not present

## 2021-06-20 DIAGNOSIS — Z5112 Encounter for antineoplastic immunotherapy: Secondary | ICD-10-CM | POA: Diagnosis not present

## 2021-06-20 DIAGNOSIS — Z1502 Genetic susceptibility to malignant neoplasm of ovary: Secondary | ICD-10-CM | POA: Diagnosis not present

## 2021-06-20 DIAGNOSIS — Z1509 Genetic susceptibility to other malignant neoplasm: Secondary | ICD-10-CM | POA: Diagnosis not present

## 2021-06-21 DIAGNOSIS — E1165 Type 2 diabetes mellitus with hyperglycemia: Secondary | ICD-10-CM | POA: Diagnosis not present

## 2021-06-21 DIAGNOSIS — Z1329 Encounter for screening for other suspected endocrine disorder: Secondary | ICD-10-CM | POA: Diagnosis not present

## 2021-06-21 DIAGNOSIS — R5383 Other fatigue: Secondary | ICD-10-CM | POA: Diagnosis not present

## 2021-06-21 DIAGNOSIS — I10 Essential (primary) hypertension: Secondary | ICD-10-CM | POA: Diagnosis not present

## 2021-06-21 DIAGNOSIS — Z13220 Encounter for screening for lipoid disorders: Secondary | ICD-10-CM | POA: Diagnosis not present

## 2021-06-25 DIAGNOSIS — C50911 Malignant neoplasm of unspecified site of right female breast: Secondary | ICD-10-CM | POA: Diagnosis not present

## 2021-06-26 DIAGNOSIS — Z516 Encounter for desensitization to allergens: Secondary | ICD-10-CM | POA: Diagnosis not present

## 2021-06-26 DIAGNOSIS — J45909 Unspecified asthma, uncomplicated: Secondary | ICD-10-CM | POA: Diagnosis not present

## 2021-07-03 DIAGNOSIS — J4542 Moderate persistent asthma with status asthmaticus: Secondary | ICD-10-CM | POA: Diagnosis not present

## 2021-07-03 DIAGNOSIS — J45909 Unspecified asthma, uncomplicated: Secondary | ICD-10-CM | POA: Diagnosis not present

## 2021-07-03 DIAGNOSIS — C50919 Malignant neoplasm of unspecified site of unspecified female breast: Secondary | ICD-10-CM | POA: Diagnosis not present

## 2021-07-03 DIAGNOSIS — I10 Essential (primary) hypertension: Secondary | ICD-10-CM | POA: Diagnosis not present

## 2021-07-03 DIAGNOSIS — F411 Generalized anxiety disorder: Secondary | ICD-10-CM | POA: Diagnosis not present

## 2021-07-03 DIAGNOSIS — E1129 Type 2 diabetes mellitus with other diabetic kidney complication: Secondary | ICD-10-CM | POA: Diagnosis not present

## 2021-07-10 DIAGNOSIS — Z1589 Genetic susceptibility to other disease: Secondary | ICD-10-CM | POA: Diagnosis not present

## 2021-07-10 DIAGNOSIS — Z1509 Genetic susceptibility to other malignant neoplasm: Secondary | ICD-10-CM | POA: Diagnosis not present

## 2021-07-10 DIAGNOSIS — Z1502 Genetic susceptibility to malignant neoplasm of ovary: Secondary | ICD-10-CM | POA: Diagnosis not present

## 2021-07-10 DIAGNOSIS — C50919 Malignant neoplasm of unspecified site of unspecified female breast: Secondary | ICD-10-CM | POA: Diagnosis not present

## 2021-07-10 DIAGNOSIS — R946 Abnormal results of thyroid function studies: Secondary | ICD-10-CM | POA: Diagnosis not present

## 2021-07-11 DIAGNOSIS — Z1589 Genetic susceptibility to other disease: Secondary | ICD-10-CM | POA: Diagnosis not present

## 2021-07-11 DIAGNOSIS — Z1502 Genetic susceptibility to malignant neoplasm of ovary: Secondary | ICD-10-CM | POA: Diagnosis not present

## 2021-07-11 DIAGNOSIS — Z1509 Genetic susceptibility to other malignant neoplasm: Secondary | ICD-10-CM | POA: Diagnosis not present

## 2021-07-11 DIAGNOSIS — C50919 Malignant neoplasm of unspecified site of unspecified female breast: Secondary | ICD-10-CM | POA: Diagnosis not present

## 2021-07-25 DIAGNOSIS — Z516 Encounter for desensitization to allergens: Secondary | ICD-10-CM | POA: Diagnosis not present

## 2021-07-25 DIAGNOSIS — J45909 Unspecified asthma, uncomplicated: Secondary | ICD-10-CM | POA: Diagnosis not present

## 2021-07-31 DIAGNOSIS — E119 Type 2 diabetes mellitus without complications: Secondary | ICD-10-CM | POA: Diagnosis not present

## 2021-07-31 DIAGNOSIS — Z1589 Genetic susceptibility to other disease: Secondary | ICD-10-CM | POA: Diagnosis not present

## 2021-07-31 DIAGNOSIS — Z170 Estrogen receptor positive status [ER+]: Secondary | ICD-10-CM | POA: Diagnosis not present

## 2021-07-31 DIAGNOSIS — Z1502 Genetic susceptibility to malignant neoplasm of ovary: Secondary | ICD-10-CM | POA: Diagnosis not present

## 2021-07-31 DIAGNOSIS — I10 Essential (primary) hypertension: Secondary | ICD-10-CM | POA: Diagnosis not present

## 2021-07-31 DIAGNOSIS — C50911 Malignant neoplasm of unspecified site of right female breast: Secondary | ICD-10-CM | POA: Diagnosis not present

## 2021-07-31 DIAGNOSIS — Z09 Encounter for follow-up examination after completed treatment for conditions other than malignant neoplasm: Secondary | ICD-10-CM | POA: Diagnosis not present

## 2021-07-31 DIAGNOSIS — Z1509 Genetic susceptibility to other malignant neoplasm: Secondary | ICD-10-CM | POA: Diagnosis not present

## 2021-07-31 DIAGNOSIS — C773 Secondary and unspecified malignant neoplasm of axilla and upper limb lymph nodes: Secondary | ICD-10-CM | POA: Diagnosis not present

## 2021-07-31 DIAGNOSIS — C50919 Malignant neoplasm of unspecified site of unspecified female breast: Secondary | ICD-10-CM | POA: Diagnosis not present

## 2021-07-31 DIAGNOSIS — R946 Abnormal results of thyroid function studies: Secondary | ICD-10-CM | POA: Diagnosis not present

## 2021-08-01 DIAGNOSIS — E1165 Type 2 diabetes mellitus with hyperglycemia: Secondary | ICD-10-CM | POA: Diagnosis not present

## 2021-08-01 DIAGNOSIS — R5383 Other fatigue: Secondary | ICD-10-CM | POA: Diagnosis not present

## 2021-08-01 DIAGNOSIS — Z13220 Encounter for screening for lipoid disorders: Secondary | ICD-10-CM | POA: Diagnosis not present

## 2021-08-01 DIAGNOSIS — I10 Essential (primary) hypertension: Secondary | ICD-10-CM | POA: Diagnosis not present

## 2021-08-02 DIAGNOSIS — C50919 Malignant neoplasm of unspecified site of unspecified female breast: Secondary | ICD-10-CM | POA: Diagnosis not present

## 2021-08-02 DIAGNOSIS — Z95828 Presence of other vascular implants and grafts: Secondary | ICD-10-CM | POA: Diagnosis not present

## 2021-08-02 DIAGNOSIS — Z1502 Genetic susceptibility to malignant neoplasm of ovary: Secondary | ICD-10-CM | POA: Diagnosis not present

## 2021-08-02 DIAGNOSIS — Z1589 Genetic susceptibility to other disease: Secondary | ICD-10-CM | POA: Diagnosis not present

## 2021-08-02 DIAGNOSIS — Z5112 Encounter for antineoplastic immunotherapy: Secondary | ICD-10-CM | POA: Diagnosis not present

## 2021-08-02 DIAGNOSIS — Z1509 Genetic susceptibility to other malignant neoplasm: Secondary | ICD-10-CM | POA: Diagnosis not present

## 2021-08-13 DIAGNOSIS — J4542 Moderate persistent asthma with status asthmaticus: Secondary | ICD-10-CM | POA: Diagnosis not present

## 2021-08-13 DIAGNOSIS — J45909 Unspecified asthma, uncomplicated: Secondary | ICD-10-CM | POA: Diagnosis not present

## 2021-08-13 DIAGNOSIS — I10 Essential (primary) hypertension: Secondary | ICD-10-CM | POA: Diagnosis not present

## 2021-08-13 DIAGNOSIS — C50919 Malignant neoplasm of unspecified site of unspecified female breast: Secondary | ICD-10-CM | POA: Diagnosis not present

## 2021-08-13 DIAGNOSIS — R7303 Prediabetes: Secondary | ICD-10-CM | POA: Diagnosis not present

## 2021-08-13 DIAGNOSIS — F411 Generalized anxiety disorder: Secondary | ICD-10-CM | POA: Diagnosis not present

## 2021-08-21 DIAGNOSIS — Z9011 Acquired absence of right breast and nipple: Secondary | ICD-10-CM | POA: Diagnosis not present

## 2021-08-21 DIAGNOSIS — Z09 Encounter for follow-up examination after completed treatment for conditions other than malignant neoplasm: Secondary | ICD-10-CM | POA: Diagnosis not present

## 2021-08-21 DIAGNOSIS — Z1502 Genetic susceptibility to malignant neoplasm of ovary: Secondary | ICD-10-CM | POA: Diagnosis not present

## 2021-08-21 DIAGNOSIS — Z1329 Encounter for screening for other suspected endocrine disorder: Secondary | ICD-10-CM | POA: Diagnosis not present

## 2021-08-21 DIAGNOSIS — Z1509 Genetic susceptibility to other malignant neoplasm: Secondary | ICD-10-CM | POA: Diagnosis not present

## 2021-08-21 DIAGNOSIS — C50919 Malignant neoplasm of unspecified site of unspecified female breast: Secondary | ICD-10-CM | POA: Diagnosis not present

## 2021-08-21 DIAGNOSIS — Z1589 Genetic susceptibility to other disease: Secondary | ICD-10-CM | POA: Diagnosis not present

## 2021-08-22 DIAGNOSIS — Z5112 Encounter for antineoplastic immunotherapy: Secondary | ICD-10-CM | POA: Diagnosis not present

## 2021-08-22 DIAGNOSIS — Z1509 Genetic susceptibility to other malignant neoplasm: Secondary | ICD-10-CM | POA: Diagnosis not present

## 2021-08-22 DIAGNOSIS — C50919 Malignant neoplasm of unspecified site of unspecified female breast: Secondary | ICD-10-CM | POA: Diagnosis not present

## 2021-08-22 DIAGNOSIS — Z1502 Genetic susceptibility to malignant neoplasm of ovary: Secondary | ICD-10-CM | POA: Diagnosis not present

## 2021-08-22 DIAGNOSIS — Z1589 Genetic susceptibility to other disease: Secondary | ICD-10-CM | POA: Diagnosis not present

## 2021-08-23 DIAGNOSIS — J4550 Severe persistent asthma, uncomplicated: Secondary | ICD-10-CM | POA: Diagnosis not present

## 2021-08-23 DIAGNOSIS — Z516 Encounter for desensitization to allergens: Secondary | ICD-10-CM | POA: Diagnosis not present

## 2021-08-23 DIAGNOSIS — J4542 Moderate persistent asthma with status asthmaticus: Secondary | ICD-10-CM | POA: Diagnosis not present

## 2021-08-23 DIAGNOSIS — Z1231 Encounter for screening mammogram for malignant neoplasm of breast: Secondary | ICD-10-CM | POA: Diagnosis not present

## 2021-08-30 ENCOUNTER — Ambulatory Visit: Payer: Medicare PPO | Admitting: Physician Assistant

## 2021-08-30 ENCOUNTER — Other Ambulatory Visit: Payer: Self-pay

## 2021-08-30 ENCOUNTER — Encounter: Payer: Self-pay | Admitting: Physician Assistant

## 2021-08-30 DIAGNOSIS — Z86018 Personal history of other benign neoplasm: Secondary | ICD-10-CM | POA: Diagnosis not present

## 2021-08-30 DIAGNOSIS — Z1283 Encounter for screening for malignant neoplasm of skin: Secondary | ICD-10-CM | POA: Diagnosis not present

## 2021-08-30 DIAGNOSIS — I5030 Unspecified diastolic (congestive) heart failure: Secondary | ICD-10-CM | POA: Diagnosis not present

## 2021-08-30 DIAGNOSIS — Z85828 Personal history of other malignant neoplasm of skin: Secondary | ICD-10-CM | POA: Diagnosis not present

## 2021-08-30 DIAGNOSIS — C50911 Malignant neoplasm of unspecified site of right female breast: Secondary | ICD-10-CM | POA: Diagnosis not present

## 2021-08-30 DIAGNOSIS — G4733 Obstructive sleep apnea (adult) (pediatric): Secondary | ICD-10-CM | POA: Diagnosis not present

## 2021-08-30 DIAGNOSIS — J4541 Moderate persistent asthma with (acute) exacerbation: Secondary | ICD-10-CM | POA: Diagnosis not present

## 2021-09-03 ENCOUNTER — Encounter: Payer: Self-pay | Admitting: Physician Assistant

## 2021-09-03 NOTE — Progress Notes (Signed)
° °  Follow-Up Visit   Subjective  Dawn Anderson is a 76 y.o. female who presents for the following: Annual Exam (New lesion in left armpit x months- no itch no bleed & black spots from radiation. No family history of melanoma or non mole skin cancers. Personal history of non mole skin cancer & atypical nevi.).   The following portions of the chart were reviewed this encounter and updated as appropriate:  Tobacco   Allergies   Meds   Problems   Med Hx   Surg Hx   Fam Hx       Objective  Well appearing patient in no apparent distress; mood and affect are within normal limits.  A full examination was performed including scalp, head, eyes, ears, nose, lips, neck, chest, axillae, abdomen, back, buttocks, bilateral upper extremities, bilateral lower extremities, hands, feet, fingers, toes, fingernails, and toenails. All findings within normal limits unless otherwise noted below.  Head to toe No atypical nevi No signs of non-mole skin cancer.    Assessment & Plan  Screening for malignant neoplasm of skin Head to toe  Yearly skin exams.    I, Toben Acuna, PA-C, have reviewed all documentation's for this visit.  The documentation on 09/03/21 for the exam, diagnosis, procedures and orders are all accurate and complete.

## 2021-09-11 DIAGNOSIS — Z79811 Long term (current) use of aromatase inhibitors: Secondary | ICD-10-CM | POA: Diagnosis not present

## 2021-09-11 DIAGNOSIS — C50919 Malignant neoplasm of unspecified site of unspecified female breast: Secondary | ICD-10-CM | POA: Diagnosis not present

## 2021-09-11 DIAGNOSIS — Z9011 Acquired absence of right breast and nipple: Secondary | ICD-10-CM | POA: Diagnosis not present

## 2021-09-12 DIAGNOSIS — C50919 Malignant neoplasm of unspecified site of unspecified female breast: Secondary | ICD-10-CM | POA: Diagnosis not present

## 2021-09-12 DIAGNOSIS — Z5112 Encounter for antineoplastic immunotherapy: Secondary | ICD-10-CM | POA: Diagnosis not present

## 2021-09-19 DIAGNOSIS — J45901 Unspecified asthma with (acute) exacerbation: Secondary | ICD-10-CM | POA: Diagnosis not present

## 2021-09-20 DIAGNOSIS — I3481 Nonrheumatic mitral (valve) annulus calcification: Secondary | ICD-10-CM | POA: Diagnosis not present

## 2021-09-20 DIAGNOSIS — Z516 Encounter for desensitization to allergens: Secondary | ICD-10-CM | POA: Diagnosis not present

## 2021-09-20 DIAGNOSIS — Z9011 Acquired absence of right breast and nipple: Secondary | ICD-10-CM | POA: Diagnosis not present

## 2021-09-20 DIAGNOSIS — I340 Nonrheumatic mitral (valve) insufficiency: Secondary | ICD-10-CM | POA: Diagnosis not present

## 2021-09-24 DIAGNOSIS — H02825 Cysts of left lower eyelid: Secondary | ICD-10-CM | POA: Diagnosis not present

## 2021-09-24 DIAGNOSIS — H1045 Other chronic allergic conjunctivitis: Secondary | ICD-10-CM | POA: Diagnosis not present

## 2021-09-24 DIAGNOSIS — Z961 Presence of intraocular lens: Secondary | ICD-10-CM | POA: Diagnosis not present

## 2021-09-24 DIAGNOSIS — E119 Type 2 diabetes mellitus without complications: Secondary | ICD-10-CM | POA: Diagnosis not present

## 2021-09-24 DIAGNOSIS — H0102B Squamous blepharitis left eye, upper and lower eyelids: Secondary | ICD-10-CM | POA: Diagnosis not present

## 2021-09-24 DIAGNOSIS — H0102A Squamous blepharitis right eye, upper and lower eyelids: Secondary | ICD-10-CM | POA: Diagnosis not present

## 2021-10-02 DIAGNOSIS — Z09 Encounter for follow-up examination after completed treatment for conditions other than malignant neoplasm: Secondary | ICD-10-CM | POA: Diagnosis not present

## 2021-10-02 DIAGNOSIS — Z9011 Acquired absence of right breast and nipple: Secondary | ICD-10-CM | POA: Diagnosis not present

## 2021-10-02 DIAGNOSIS — Z1509 Genetic susceptibility to other malignant neoplasm: Secondary | ICD-10-CM | POA: Diagnosis not present

## 2021-10-02 DIAGNOSIS — Z1589 Genetic susceptibility to other disease: Secondary | ICD-10-CM | POA: Diagnosis not present

## 2021-10-02 DIAGNOSIS — C50919 Malignant neoplasm of unspecified site of unspecified female breast: Secondary | ICD-10-CM | POA: Diagnosis not present

## 2021-10-02 DIAGNOSIS — Z1502 Genetic susceptibility to malignant neoplasm of ovary: Secondary | ICD-10-CM | POA: Diagnosis not present

## 2021-10-03 DIAGNOSIS — Z95828 Presence of other vascular implants and grafts: Secondary | ICD-10-CM | POA: Diagnosis not present

## 2021-10-03 DIAGNOSIS — Z1589 Genetic susceptibility to other disease: Secondary | ICD-10-CM | POA: Diagnosis not present

## 2021-10-03 DIAGNOSIS — Z5112 Encounter for antineoplastic immunotherapy: Secondary | ICD-10-CM | POA: Diagnosis not present

## 2021-10-03 DIAGNOSIS — C50919 Malignant neoplasm of unspecified site of unspecified female breast: Secondary | ICD-10-CM | POA: Diagnosis not present

## 2021-10-03 DIAGNOSIS — Z1509 Genetic susceptibility to other malignant neoplasm: Secondary | ICD-10-CM | POA: Diagnosis not present

## 2021-10-03 DIAGNOSIS — Z1502 Genetic susceptibility to malignant neoplasm of ovary: Secondary | ICD-10-CM | POA: Diagnosis not present

## 2021-10-16 DIAGNOSIS — Z1389 Encounter for screening for other disorder: Secondary | ICD-10-CM | POA: Diagnosis not present

## 2021-10-16 DIAGNOSIS — E1165 Type 2 diabetes mellitus with hyperglycemia: Secondary | ICD-10-CM | POA: Diagnosis not present

## 2021-10-16 DIAGNOSIS — L03019 Cellulitis of unspecified finger: Secondary | ICD-10-CM | POA: Diagnosis not present

## 2021-10-16 DIAGNOSIS — Z1331 Encounter for screening for depression: Secondary | ICD-10-CM | POA: Diagnosis not present

## 2021-10-17 DIAGNOSIS — E119 Type 2 diabetes mellitus without complications: Secondary | ICD-10-CM | POA: Diagnosis not present

## 2021-10-18 DIAGNOSIS — M9903 Segmental and somatic dysfunction of lumbar region: Secondary | ICD-10-CM | POA: Diagnosis not present

## 2021-10-18 DIAGNOSIS — M47816 Spondylosis without myelopathy or radiculopathy, lumbar region: Secondary | ICD-10-CM | POA: Diagnosis not present

## 2021-10-18 DIAGNOSIS — M47812 Spondylosis without myelopathy or radiculopathy, cervical region: Secondary | ICD-10-CM | POA: Diagnosis not present

## 2021-10-18 DIAGNOSIS — M9902 Segmental and somatic dysfunction of thoracic region: Secondary | ICD-10-CM | POA: Diagnosis not present

## 2021-10-18 DIAGNOSIS — S233XXA Sprain of ligaments of thoracic spine, initial encounter: Secondary | ICD-10-CM | POA: Diagnosis not present

## 2021-10-18 DIAGNOSIS — M9901 Segmental and somatic dysfunction of cervical region: Secondary | ICD-10-CM | POA: Diagnosis not present

## 2021-10-18 DIAGNOSIS — Z516 Encounter for desensitization to allergens: Secondary | ICD-10-CM | POA: Diagnosis not present

## 2021-10-29 ENCOUNTER — Other Ambulatory Visit: Payer: Self-pay | Admitting: Cardiology

## 2021-10-29 NOTE — Telephone Encounter (Signed)
Prescription refill request for Xarelto received.  ?Indication: PAF ?Last office visit: 08/10/20  Zandra Abts MD ?Weight: 104kg ?Age: 76 ?Scr: 0.88 on 10/02/21 ?CrCl: 90.69 ? ?Based on above findings Xarelto '20mg'$  daily is the appropriate dose.  Patient is past due to see Dr Harl Bowie.  Message sent to scheduler to make appt.  Refill approved x 3 to allow time to get appt scheduled. ? ?

## 2021-10-31 DIAGNOSIS — E119 Type 2 diabetes mellitus without complications: Secondary | ICD-10-CM | POA: Diagnosis not present

## 2021-11-06 DIAGNOSIS — S233XXA Sprain of ligaments of thoracic spine, initial encounter: Secondary | ICD-10-CM | POA: Diagnosis not present

## 2021-11-06 DIAGNOSIS — M9903 Segmental and somatic dysfunction of lumbar region: Secondary | ICD-10-CM | POA: Diagnosis not present

## 2021-11-06 DIAGNOSIS — M47816 Spondylosis without myelopathy or radiculopathy, lumbar region: Secondary | ICD-10-CM | POA: Diagnosis not present

## 2021-11-06 DIAGNOSIS — M47812 Spondylosis without myelopathy or radiculopathy, cervical region: Secondary | ICD-10-CM | POA: Diagnosis not present

## 2021-11-06 DIAGNOSIS — M9902 Segmental and somatic dysfunction of thoracic region: Secondary | ICD-10-CM | POA: Diagnosis not present

## 2021-11-06 DIAGNOSIS — M9901 Segmental and somatic dysfunction of cervical region: Secondary | ICD-10-CM | POA: Diagnosis not present

## 2021-11-19 DIAGNOSIS — J4542 Moderate persistent asthma with status asthmaticus: Secondary | ICD-10-CM | POA: Diagnosis not present

## 2021-11-19 DIAGNOSIS — Z95828 Presence of other vascular implants and grafts: Secondary | ICD-10-CM | POA: Diagnosis not present

## 2021-11-19 DIAGNOSIS — Z516 Encounter for desensitization to allergens: Secondary | ICD-10-CM | POA: Diagnosis not present

## 2021-11-19 DIAGNOSIS — J4550 Severe persistent asthma, uncomplicated: Secondary | ICD-10-CM | POA: Diagnosis not present

## 2021-11-21 DIAGNOSIS — M47816 Spondylosis without myelopathy or radiculopathy, lumbar region: Secondary | ICD-10-CM | POA: Diagnosis not present

## 2021-11-21 DIAGNOSIS — S233XXA Sprain of ligaments of thoracic spine, initial encounter: Secondary | ICD-10-CM | POA: Diagnosis not present

## 2021-11-21 DIAGNOSIS — M9902 Segmental and somatic dysfunction of thoracic region: Secondary | ICD-10-CM | POA: Diagnosis not present

## 2021-11-21 DIAGNOSIS — M9901 Segmental and somatic dysfunction of cervical region: Secondary | ICD-10-CM | POA: Diagnosis not present

## 2021-11-21 DIAGNOSIS — M9903 Segmental and somatic dysfunction of lumbar region: Secondary | ICD-10-CM | POA: Diagnosis not present

## 2021-11-21 DIAGNOSIS — M47812 Spondylosis without myelopathy or radiculopathy, cervical region: Secondary | ICD-10-CM | POA: Diagnosis not present

## 2021-11-22 DIAGNOSIS — C50911 Malignant neoplasm of unspecified site of right female breast: Secondary | ICD-10-CM | POA: Diagnosis not present

## 2021-11-22 DIAGNOSIS — J4540 Moderate persistent asthma, uncomplicated: Secondary | ICD-10-CM | POA: Diagnosis not present

## 2021-11-22 DIAGNOSIS — E1129 Type 2 diabetes mellitus with other diabetic kidney complication: Secondary | ICD-10-CM | POA: Diagnosis not present

## 2021-11-22 DIAGNOSIS — J0110 Acute frontal sinusitis, unspecified: Secondary | ICD-10-CM | POA: Diagnosis not present

## 2021-11-22 DIAGNOSIS — I10 Essential (primary) hypertension: Secondary | ICD-10-CM | POA: Diagnosis not present

## 2021-11-26 DIAGNOSIS — I10 Essential (primary) hypertension: Secondary | ICD-10-CM | POA: Diagnosis not present

## 2021-11-26 DIAGNOSIS — Z13220 Encounter for screening for lipoid disorders: Secondary | ICD-10-CM | POA: Diagnosis not present

## 2021-11-26 DIAGNOSIS — E1165 Type 2 diabetes mellitus with hyperglycemia: Secondary | ICD-10-CM | POA: Diagnosis not present

## 2021-11-30 DIAGNOSIS — J45909 Unspecified asthma, uncomplicated: Secondary | ICD-10-CM | POA: Diagnosis not present

## 2021-11-30 DIAGNOSIS — I10 Essential (primary) hypertension: Secondary | ICD-10-CM | POA: Diagnosis not present

## 2021-11-30 DIAGNOSIS — C50919 Malignant neoplasm of unspecified site of unspecified female breast: Secondary | ICD-10-CM | POA: Diagnosis not present

## 2021-11-30 DIAGNOSIS — K5900 Constipation, unspecified: Secondary | ICD-10-CM | POA: Diagnosis not present

## 2021-11-30 DIAGNOSIS — E119 Type 2 diabetes mellitus without complications: Secondary | ICD-10-CM | POA: Diagnosis not present

## 2021-11-30 DIAGNOSIS — F411 Generalized anxiety disorder: Secondary | ICD-10-CM | POA: Diagnosis not present

## 2021-11-30 DIAGNOSIS — E7849 Other hyperlipidemia: Secondary | ICD-10-CM | POA: Diagnosis not present

## 2021-11-30 DIAGNOSIS — J4542 Moderate persistent asthma with status asthmaticus: Secondary | ICD-10-CM | POA: Diagnosis not present

## 2021-12-19 DIAGNOSIS — Z516 Encounter for desensitization to allergens: Secondary | ICD-10-CM | POA: Diagnosis not present

## 2021-12-19 DIAGNOSIS — J4550 Severe persistent asthma, uncomplicated: Secondary | ICD-10-CM | POA: Diagnosis not present

## 2021-12-19 DIAGNOSIS — J4542 Moderate persistent asthma with status asthmaticus: Secondary | ICD-10-CM | POA: Diagnosis not present

## 2021-12-25 ENCOUNTER — Encounter: Payer: Self-pay | Admitting: Cardiology

## 2021-12-25 ENCOUNTER — Ambulatory Visit: Payer: Medicare PPO | Admitting: Cardiology

## 2021-12-25 VITALS — BP 132/82 | HR 77 | Ht 68.0 in | Wt 215.0 lb

## 2021-12-25 DIAGNOSIS — I10 Essential (primary) hypertension: Secondary | ICD-10-CM

## 2021-12-25 DIAGNOSIS — I480 Paroxysmal atrial fibrillation: Secondary | ICD-10-CM

## 2021-12-25 NOTE — Progress Notes (Signed)
Clinical Summary Ms. Wauters is a 76 y.o.female seen today for follow up of the following medical problems.     1. PAF - no recent palpitations.  - no bleeding on xarelto. CrCl is 85 today.   2. HTN - compliant with meds - at cancer center 120s/70s     3. Multiple allergies     4. History of breast cancer - just finished round of chemo  5. OSA - on cpap     Soc Hx: Retired high school world history Pharmacist, hospital. 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.     Past Medical History:   Past Medical History:  Diagnosis Date   Arthritis    Asthma    Atypical mole 03/21/2006   moderate atypia - left calf   Atypical mole 03/21/2006   moderate atypia - left pec   Basal cell carcinoma 03/13/1999   mid to upper back   Breast cancer ([facility]) 07/12/2020   right   Diabetes mellitus without complication (HCC)      Allergies  Allergen Reactions   Albuterol Sulfate Anaphylaxis   Apple Juice Anaphylaxis   Asa [Aspirin] Anaphylaxis   Coconut (Cocos Nucifera) Itching   Origanum Oil Anaphylaxis   Pineapple Anaphylaxis   Plastibase Itching   Prunus Persica Anaphylaxis   Salicylates Anaphylaxis   Salmeterol Anaphylaxis   Strawberry (Diagnostic) Anaphylaxis   Sulfamethizole Itching   Chocolate Hives   Clarithromycin Rash   Penicillins Nausea Only and Rash   Latex Itching   Morphine Itching     Current Outpatient Medications  Medication Sig Dispense Refill   B Complex Vitamins (VITAMIN-B COMPLEX PO) Take 1 tablet by mouth daily.     cyclobenzaprine (FLEXERIL) 5 MG tablet Take 1 tablet by mouth every 8 (eight) hours as needed. (Patient not taking: Reported on 08/30/2021)     fluticasone (FLONASE) 50 MCG/ACT nasal spray Place into both nostrils daily.     letrozole (FEMARA) 2.5 MG tablet Take 2.5 mg by mouth daily.     LORazepam (ATIVAN) 1 MG tablet Take 1 mg by mouth every 8 (eight) hours. 1/2 in am and 1/2 pm     losartan-hydrochlorothiazide (HYZAAR) 50-12.5 MG  tablet Take 1 tablet by mouth every morning.  2   METFORMIN HCL PO Take by mouth.     MILK THISTLE PO Take by mouth daily.     montelukast (SINGULAIR) 10 MG tablet Take 10 mg by mouth at bedtime.     Multiple Vitamin (MULTIVITAMIN) tablet Take 1 tablet by mouth daily.     NON FORMULARY CPAP MACHINE     nystatin-triamcinolone (MYCOLOG II) cream Apply 1 application topically 2 (two) times daily. Apply thin layer bid prn 30 g 3   ondansetron (ZOFRAN) 8 MG tablet Take by mouth every 8 (eight) hours as needed for nausea or vomiting.     oxyCODONE (OXY IR/ROXICODONE) 5 MG immediate release tablet Take by mouth. (Patient not taking: Reported on 08/30/2021)     pregabalin (LYRICA) 50 MG capsule Take 50 mg by mouth 3 (three) times daily.     rivaroxaban (XARELTO) 20 MG TABS tablet TAKE 1 TABLET BY MOUTH DAILY WITH SUPPER 30 tablet 2   sertraline (ZOLOFT) 25 MG tablet Take 25 mg by mouth daily.     SYMBICORT 160-4.5 MCG/ACT inhaler Inhale 2 puffs into the lungs 2 (two) times daily.     XOLAIR 150 MG injection Inject 150 mg into the skin every 28 (twenty-eight)  days.     XOPENEX HFA 45 MCG/ACT inhaler Inhale 2 puffs into the lungs as needed.     No current facility-administered medications for this visit.     Past Surgical History:  Procedure Laterality Date   ABDOMINAL HYSTERECTOMY     BACK SURGERY     x2   BREAST SURGERY Left    lump removed   CHOLECYSTECTOMY     COLONOSCOPY N/A 03/22/2015   Procedure: COLONOSCOPY;  Surgeon: Rogene Houston, MD;  Location: [facility];  Service: Endoscopy;  Laterality: N/A;  830 - moved to 8/17 @ 7:30 - Ann to notify pt   MASTECTOMY Right 07/12/2020   NASAL SINUS SURGERY     SHOULDER ARTHROSCOPY W/ ROTATOR CUFF REPAIR Right      Allergies  Allergen Reactions   Albuterol Sulfate Anaphylaxis   Apple Juice Anaphylaxis   Asa [Aspirin] Anaphylaxis   Coconut (Cocos Nucifera) Itching   Origanum Oil Anaphylaxis   Pineapple Anaphylaxis   Plastibase Itching    Prunus Persica Anaphylaxis   Salicylates Anaphylaxis   Salmeterol Anaphylaxis   Strawberry (Diagnostic) Anaphylaxis   Sulfamethizole Itching   Chocolate Hives   Clarithromycin Rash   Penicillins Nausea Only and Rash   Latex Itching   Morphine Itching      Family History  Problem Relation Age of Onset   Alzheimer's disease Mother    Kidney disease Father    Heart disease Father    Ehlers-Danlos syndrome Son      Social History Ms. Mulka reports that she has never smoked. She has never used smokeless tobacco. Ms. Vanburen reports no history of alcohol use.   Review of Systems CONSTITUTIONAL: No weight loss, fever, chills, weakness or fatigue.  HEENT: Eyes: No visual loss, blurred vision, double vision or yellow sclerae.No hearing loss, sneezing, congestion, runny nose or sore throat.  SKIN: No rash or itching.  CARDIOVASCULAR: per hpi RESPIRATORY: No shortness of breath, cough or sputum.  GASTROINTESTINAL: No anorexia, nausea, vomiting or diarrhea. No abdominal pain or blood.  GENITOURINARY: No burning on urination, no polyuria NEUROLOGICAL: No headache, dizziness, syncope, paralysis, ataxia, numbness or tingling in the extremities. No change in bowel or bladder control.  MUSCULOSKELETAL: No muscle, back pain, joint pain or stiffness.  LYMPHATICS: No enlarged nodes. No history of splenectomy.  PSYCHIATRIC: No history of depression or anxiety.  ENDOCRINOLOGIC: No reports of sweating, cold or heat intolerance. No polyuria or polydipsia.  Marland Kitchen   Physical Examination Today's Vitals   12/25/21 1340  BP: 132/82  Pulse: 77  SpO2: 96%  Weight: 215 lb (97.5 kg)  Height: '5\' 8"'$  (1.727 m)   Body mass index is 32.69 kg/m.  Gen: resting comfortably, no acute distress HEENT: no scleral icterus, pupils equal round and reactive, no palptable cervical adenopathy,  CV: RRR, no mrg, no jvd Resp: Clear to auscultation bilaterally GI: abdomen is soft, non-tender, non-distended,  normal bowel sounds, no hepatosplenomegaly MSK: extremities are warm, no edema.  Skin: warm, no rash Neuro:  no focal deficits Psych: appropriate affect   Diagnostic Studies Jan 2022 echo Summary    1. Technically difficult study.    2. The left ventricle is normal in size with upper normal wall thickness.    3. The left ventricular systolic function is normal, LVEF is visually  estimated at 65-70%.    4. There is grade I diastolic dysfunction (impaired relaxation).    5. The aortic valve is trileaflet with mildly thickened leaflets with normal  excursion.    6. The left atrium is mildly to moderately dilated in size.    7. The right ventricle is normal in size, with normal systolic function.    8. The right atrium is mildly dilated  in size.    9. No strain imaging possible due to overall poor image quality.    01/2021 echo [facility] Summary  1. The left ventricle is normal in size with upper normal wall thickness.  2. The left ventricular systolic function is normal, LVEF is visually estimated at 65-70%.  3. There is grade I diastolic dysfunction (impaired relaxation).  4. The aortic valve is trileaflet with mildly thickened leaflets with normal excursion.  5. The left atrium is mildly to moderately dilated in size.  6. The right ventricle is normal in size, with normal systolic function.  7. The right atrium is mildly dilated  in size.   09/2021 echo [facility] Summary    1. Technically difficult study.    2. The left ventricle is normal in size with normal wall thickness.    3. The left ventricular systolic function is normal, LVEF is visually  estimated at 65-70%.    4. There is grade I diastolic dysfunction (impaired relaxation).    5. The aortic valve is trileaflet with mildly thickened leaflets with normal  excursion.    6. The left atrium is mildly to moderately dilated in size.    7. The right ventricle is normal in size, with normal systolic function.   Assessment  and Plan  1. PAF/acquired thrombophlia - no documented episodes in quite some time, has not had any symptoms - not requiring av nodal agent at this time - doing well on xarelto, continue for stroke prevention - EKG today shows NSR   2. HTN - at goal, continue current meds  F/u 1 year    Arnoldo Lenis, M.D.

## 2021-12-25 NOTE — Patient Instructions (Signed)

## 2022-01-01 DIAGNOSIS — M47816 Spondylosis without myelopathy or radiculopathy, lumbar region: Secondary | ICD-10-CM | POA: Diagnosis not present

## 2022-01-01 DIAGNOSIS — M9901 Segmental and somatic dysfunction of cervical region: Secondary | ICD-10-CM | POA: Diagnosis not present

## 2022-01-01 DIAGNOSIS — M47812 Spondylosis without myelopathy or radiculopathy, cervical region: Secondary | ICD-10-CM | POA: Diagnosis not present

## 2022-01-01 DIAGNOSIS — M9903 Segmental and somatic dysfunction of lumbar region: Secondary | ICD-10-CM | POA: Diagnosis not present

## 2022-01-01 DIAGNOSIS — M9902 Segmental and somatic dysfunction of thoracic region: Secondary | ICD-10-CM | POA: Diagnosis not present

## 2022-01-01 DIAGNOSIS — S233XXA Sprain of ligaments of thoracic spine, initial encounter: Secondary | ICD-10-CM | POA: Diagnosis not present

## 2022-01-14 DIAGNOSIS — S233XXA Sprain of ligaments of thoracic spine, initial encounter: Secondary | ICD-10-CM | POA: Diagnosis not present

## 2022-01-14 DIAGNOSIS — Z1509 Genetic susceptibility to other malignant neoplasm: Secondary | ICD-10-CM | POA: Diagnosis not present

## 2022-01-14 DIAGNOSIS — M9901 Segmental and somatic dysfunction of cervical region: Secondary | ICD-10-CM | POA: Diagnosis not present

## 2022-01-14 DIAGNOSIS — M9903 Segmental and somatic dysfunction of lumbar region: Secondary | ICD-10-CM | POA: Diagnosis not present

## 2022-01-14 DIAGNOSIS — M9902 Segmental and somatic dysfunction of thoracic region: Secondary | ICD-10-CM | POA: Diagnosis not present

## 2022-01-14 DIAGNOSIS — C50919 Malignant neoplasm of unspecified site of unspecified female breast: Secondary | ICD-10-CM | POA: Diagnosis not present

## 2022-01-14 DIAGNOSIS — Z1502 Genetic susceptibility to malignant neoplasm of ovary: Secondary | ICD-10-CM | POA: Diagnosis not present

## 2022-01-14 DIAGNOSIS — Z1589 Genetic susceptibility to other disease: Secondary | ICD-10-CM | POA: Diagnosis not present

## 2022-01-14 DIAGNOSIS — Z9011 Acquired absence of right breast and nipple: Secondary | ICD-10-CM | POA: Diagnosis not present

## 2022-01-14 DIAGNOSIS — M47812 Spondylosis without myelopathy or radiculopathy, cervical region: Secondary | ICD-10-CM | POA: Diagnosis not present

## 2022-01-14 DIAGNOSIS — M47816 Spondylosis without myelopathy or radiculopathy, lumbar region: Secondary | ICD-10-CM | POA: Diagnosis not present

## 2022-01-17 DIAGNOSIS — Z516 Encounter for desensitization to allergens: Secondary | ICD-10-CM | POA: Diagnosis not present

## 2022-01-17 DIAGNOSIS — J4550 Severe persistent asthma, uncomplicated: Secondary | ICD-10-CM | POA: Diagnosis not present

## 2022-01-17 DIAGNOSIS — J4542 Moderate persistent asthma with status asthmaticus: Secondary | ICD-10-CM | POA: Diagnosis not present

## 2022-01-17 DIAGNOSIS — J45909 Unspecified asthma, uncomplicated: Secondary | ICD-10-CM | POA: Diagnosis not present

## 2022-01-21 DIAGNOSIS — Z1502 Genetic susceptibility to malignant neoplasm of ovary: Secondary | ICD-10-CM | POA: Diagnosis not present

## 2022-01-21 DIAGNOSIS — Z1509 Genetic susceptibility to other malignant neoplasm: Secondary | ICD-10-CM | POA: Diagnosis not present

## 2022-01-21 DIAGNOSIS — Z9011 Acquired absence of right breast and nipple: Secondary | ICD-10-CM | POA: Diagnosis not present

## 2022-01-21 DIAGNOSIS — C50919 Malignant neoplasm of unspecified site of unspecified female breast: Secondary | ICD-10-CM | POA: Diagnosis not present

## 2022-01-21 DIAGNOSIS — Z1589 Genetic susceptibility to other disease: Secondary | ICD-10-CM | POA: Diagnosis not present

## 2022-02-06 DIAGNOSIS — M47816 Spondylosis without myelopathy or radiculopathy, lumbar region: Secondary | ICD-10-CM | POA: Diagnosis not present

## 2022-02-06 DIAGNOSIS — S233XXA Sprain of ligaments of thoracic spine, initial encounter: Secondary | ICD-10-CM | POA: Diagnosis not present

## 2022-02-06 DIAGNOSIS — M9902 Segmental and somatic dysfunction of thoracic region: Secondary | ICD-10-CM | POA: Diagnosis not present

## 2022-02-06 DIAGNOSIS — M9903 Segmental and somatic dysfunction of lumbar region: Secondary | ICD-10-CM | POA: Diagnosis not present

## 2022-02-06 DIAGNOSIS — M47812 Spondylosis without myelopathy or radiculopathy, cervical region: Secondary | ICD-10-CM | POA: Diagnosis not present

## 2022-02-06 DIAGNOSIS — M9901 Segmental and somatic dysfunction of cervical region: Secondary | ICD-10-CM | POA: Diagnosis not present

## 2022-02-07 ENCOUNTER — Telehealth: Payer: Self-pay | Admitting: *Deleted

## 2022-02-07 DIAGNOSIS — I158 Other secondary hypertension: Secondary | ICD-10-CM | POA: Diagnosis not present

## 2022-02-07 DIAGNOSIS — Z95828 Presence of other vascular implants and grafts: Secondary | ICD-10-CM | POA: Diagnosis not present

## 2022-02-07 DIAGNOSIS — Z1589 Genetic susceptibility to other disease: Secondary | ICD-10-CM | POA: Diagnosis not present

## 2022-02-07 DIAGNOSIS — Z09 Encounter for follow-up examination after completed treatment for conditions other than malignant neoplasm: Secondary | ICD-10-CM | POA: Diagnosis not present

## 2022-02-07 DIAGNOSIS — C50919 Malignant neoplasm of unspecified site of unspecified female breast: Secondary | ICD-10-CM | POA: Diagnosis not present

## 2022-02-07 DIAGNOSIS — Z1509 Genetic susceptibility to other malignant neoplasm: Secondary | ICD-10-CM | POA: Diagnosis not present

## 2022-02-07 DIAGNOSIS — I10 Essential (primary) hypertension: Secondary | ICD-10-CM | POA: Insufficient documentation

## 2022-02-07 DIAGNOSIS — Z1502 Genetic susceptibility to malignant neoplasm of ovary: Secondary | ICD-10-CM | POA: Diagnosis not present

## 2022-02-07 NOTE — Telephone Encounter (Signed)
Patient with diagnosis of A Fib on Xarelto for anticoagulation.    Procedure: power port removed Date of procedure: 02/12/22   CHA2DS2-VASc Score = 5  This indicates a 7.2% annual risk of stroke. The patient's score is based upon: CHF History: 0 HTN History: 1 Diabetes History: 1 Stroke History: 0 Vascular Disease History: 0 Age Score: 2 Gender Score: 1   CrCl 72 mL/min using adj body weight Platelet count 355K   Per office protocol, patient can hold Xarelto for 2 days prior to procedure.    **This guidance is not considered finalized until pre-operative APP has relayed final recommendations.**

## 2022-02-07 NOTE — Telephone Encounter (Signed)
   Pre-operative Risk Assessment    Patient Name: Dawn Anderson  DOB: 1946-01-03 MRN: [id number]      Request for Surgical Clearance    Procedure:   power port removed  Date of Surgery:  Clearance 02/12/22                                 Surgeon:  Dr. Adelina Mings Surgeon's Group or Practice Name:  [facility] number:  [phone] Fax number:  [phone]   Type of Clearance Requested:   - Pharmacy:  Hold Rivaroxaban (Xarelto) starting on 02/10/2022   Type of Anesthesia:  Not Indicated   Additional requests/questions:   contact person on request is Gardiner Coins, LPN   SignedLovina Reach   02/07/2022, 3:29 PM

## 2022-02-08 NOTE — Telephone Encounter (Signed)
   Patient Name: Dawn Anderson  DOB: 1945/09/19 MRN: [id number]  Primary Cardiologist: Carlyle Dolly, MD  [facility] pharmacists have reviewed the patient's past medical history, labs and current medications as part of pre-operative protocol coverage.   The following recommendations have been made:  Patient with diagnosis of A Fib on Xarelto for anticoagulation.     Procedure: power port removed Date of procedure: 02/12/22     CHA2DS2-VASc Score = 5  This indicates a 7.2% annual risk of stroke. The patient's score is based upon: CHF History: 0 HTN History: 1 Diabetes History: 1 Stroke History: 0 Vascular Disease History: 0 Age Score: 2 Gender Score: 1     CrCl 72 mL/min using adj body weight Platelet count 355K     Per office protocol, patient can hold Xarelto for 2 days prior to procedure. Please resume Xarelto as soon as possible postprocedure, at the discretion of the surgeon.   I will route this recommendation to the requesting party via Epic fax function and remove from pre-op pool.  Please call with questions.  Lenna Sciara, NP 02/08/2022, 9:50 AM

## 2022-02-12 DIAGNOSIS — Z452 Encounter for adjustment and management of vascular access device: Secondary | ICD-10-CM | POA: Diagnosis not present

## 2022-02-12 DIAGNOSIS — Z516 Encounter for desensitization to allergens: Secondary | ICD-10-CM | POA: Diagnosis not present

## 2022-02-12 DIAGNOSIS — J4550 Severe persistent asthma, uncomplicated: Secondary | ICD-10-CM | POA: Diagnosis not present

## 2022-02-12 DIAGNOSIS — J4542 Moderate persistent asthma with status asthmaticus: Secondary | ICD-10-CM | POA: Diagnosis not present

## 2022-02-12 DIAGNOSIS — Z853 Personal history of malignant neoplasm of breast: Secondary | ICD-10-CM | POA: Diagnosis not present

## 2022-02-19 DIAGNOSIS — H6692 Otitis media, unspecified, left ear: Secondary | ICD-10-CM | POA: Diagnosis not present

## 2022-02-19 DIAGNOSIS — H6591 Unspecified nonsuppurative otitis media, right ear: Secondary | ICD-10-CM | POA: Diagnosis not present

## 2022-02-20 DIAGNOSIS — M47816 Spondylosis without myelopathy or radiculopathy, lumbar region: Secondary | ICD-10-CM | POA: Diagnosis not present

## 2022-02-20 DIAGNOSIS — M9903 Segmental and somatic dysfunction of lumbar region: Secondary | ICD-10-CM | POA: Diagnosis not present

## 2022-02-20 DIAGNOSIS — M47812 Spondylosis without myelopathy or radiculopathy, cervical region: Secondary | ICD-10-CM | POA: Diagnosis not present

## 2022-02-20 DIAGNOSIS — M9902 Segmental and somatic dysfunction of thoracic region: Secondary | ICD-10-CM | POA: Diagnosis not present

## 2022-02-20 DIAGNOSIS — M9901 Segmental and somatic dysfunction of cervical region: Secondary | ICD-10-CM | POA: Diagnosis not present

## 2022-02-20 DIAGNOSIS — S233XXA Sprain of ligaments of thoracic spine, initial encounter: Secondary | ICD-10-CM | POA: Diagnosis not present

## 2022-02-22 ENCOUNTER — Encounter (INDEPENDENT_AMBULATORY_CARE_PROVIDER_SITE_OTHER): Payer: Self-pay | Admitting: *Deleted

## 2022-02-24 ENCOUNTER — Other Ambulatory Visit: Payer: Self-pay | Admitting: Cardiology

## 2022-02-25 NOTE — Telephone Encounter (Signed)
Prescription refill request for Xarelto received.  Indication: PAF Last office visit: 12/25/21  Zandra Abts MD Weight: 97.5kg Age: 76 Scr: 0.82 on 02/07/22 CrCl: 91.24  Based on above findings Xarelto '20mg'$  daily is the appropriate dose.  Refill approved.

## 2022-02-27 ENCOUNTER — Encounter: Payer: Self-pay | Admitting: Physician Assistant

## 2022-02-27 ENCOUNTER — Ambulatory Visit: Payer: Medicare PPO | Admitting: Physician Assistant

## 2022-02-27 DIAGNOSIS — Z1283 Encounter for screening for malignant neoplasm of skin: Secondary | ICD-10-CM

## 2022-02-27 DIAGNOSIS — L820 Inflamed seborrheic keratosis: Secondary | ICD-10-CM

## 2022-02-28 DIAGNOSIS — M9901 Segmental and somatic dysfunction of cervical region: Secondary | ICD-10-CM | POA: Diagnosis not present

## 2022-02-28 DIAGNOSIS — C50911 Malignant neoplasm of unspecified site of right female breast: Secondary | ICD-10-CM | POA: Diagnosis not present

## 2022-02-28 DIAGNOSIS — I5030 Unspecified diastolic (congestive) heart failure: Secondary | ICD-10-CM | POA: Diagnosis not present

## 2022-02-28 DIAGNOSIS — S233XXA Sprain of ligaments of thoracic spine, initial encounter: Secondary | ICD-10-CM | POA: Diagnosis not present

## 2022-02-28 DIAGNOSIS — M47812 Spondylosis without myelopathy or radiculopathy, cervical region: Secondary | ICD-10-CM | POA: Diagnosis not present

## 2022-02-28 DIAGNOSIS — G4733 Obstructive sleep apnea (adult) (pediatric): Secondary | ICD-10-CM | POA: Diagnosis not present

## 2022-02-28 DIAGNOSIS — M47816 Spondylosis without myelopathy or radiculopathy, lumbar region: Secondary | ICD-10-CM | POA: Diagnosis not present

## 2022-02-28 DIAGNOSIS — M9902 Segmental and somatic dysfunction of thoracic region: Secondary | ICD-10-CM | POA: Diagnosis not present

## 2022-02-28 DIAGNOSIS — J4540 Moderate persistent asthma, uncomplicated: Secondary | ICD-10-CM | POA: Diagnosis not present

## 2022-02-28 DIAGNOSIS — M9903 Segmental and somatic dysfunction of lumbar region: Secondary | ICD-10-CM | POA: Diagnosis not present

## 2022-03-04 DIAGNOSIS — E7849 Other hyperlipidemia: Secondary | ICD-10-CM | POA: Diagnosis not present

## 2022-03-04 DIAGNOSIS — I10 Essential (primary) hypertension: Secondary | ICD-10-CM | POA: Diagnosis not present

## 2022-03-04 DIAGNOSIS — Z1329 Encounter for screening for other suspected endocrine disorder: Secondary | ICD-10-CM | POA: Diagnosis not present

## 2022-03-04 DIAGNOSIS — Z13220 Encounter for screening for lipoid disorders: Secondary | ICD-10-CM | POA: Diagnosis not present

## 2022-03-04 DIAGNOSIS — E1165 Type 2 diabetes mellitus with hyperglycemia: Secondary | ICD-10-CM | POA: Diagnosis not present

## 2022-03-04 DIAGNOSIS — E1129 Type 2 diabetes mellitus with other diabetic kidney complication: Secondary | ICD-10-CM | POA: Diagnosis not present

## 2022-03-05 DIAGNOSIS — Z95828 Presence of other vascular implants and grafts: Secondary | ICD-10-CM | POA: Diagnosis not present

## 2022-03-13 DIAGNOSIS — M9901 Segmental and somatic dysfunction of cervical region: Secondary | ICD-10-CM | POA: Diagnosis not present

## 2022-03-13 DIAGNOSIS — S233XXA Sprain of ligaments of thoracic spine, initial encounter: Secondary | ICD-10-CM | POA: Diagnosis not present

## 2022-03-13 DIAGNOSIS — M9903 Segmental and somatic dysfunction of lumbar region: Secondary | ICD-10-CM | POA: Diagnosis not present

## 2022-03-13 DIAGNOSIS — M47812 Spondylosis without myelopathy or radiculopathy, cervical region: Secondary | ICD-10-CM | POA: Diagnosis not present

## 2022-03-13 DIAGNOSIS — M47816 Spondylosis without myelopathy or radiculopathy, lumbar region: Secondary | ICD-10-CM | POA: Diagnosis not present

## 2022-03-13 DIAGNOSIS — M9902 Segmental and somatic dysfunction of thoracic region: Secondary | ICD-10-CM | POA: Diagnosis not present

## 2022-03-14 ENCOUNTER — Encounter: Payer: Self-pay | Admitting: Physician Assistant

## 2022-03-14 DIAGNOSIS — I10 Essential (primary) hypertension: Secondary | ICD-10-CM | POA: Diagnosis not present

## 2022-03-14 DIAGNOSIS — E7849 Other hyperlipidemia: Secondary | ICD-10-CM | POA: Diagnosis not present

## 2022-03-14 DIAGNOSIS — E119 Type 2 diabetes mellitus without complications: Secondary | ICD-10-CM | POA: Diagnosis not present

## 2022-03-14 DIAGNOSIS — Z9622 Myringotomy tube(s) status: Secondary | ICD-10-CM | POA: Diagnosis not present

## 2022-03-14 DIAGNOSIS — K5900 Constipation, unspecified: Secondary | ICD-10-CM | POA: Diagnosis not present

## 2022-03-14 DIAGNOSIS — J4550 Severe persistent asthma, uncomplicated: Secondary | ICD-10-CM | POA: Diagnosis not present

## 2022-03-14 DIAGNOSIS — F411 Generalized anxiety disorder: Secondary | ICD-10-CM | POA: Diagnosis not present

## 2022-03-14 DIAGNOSIS — J4542 Moderate persistent asthma with status asthmaticus: Secondary | ICD-10-CM | POA: Diagnosis not present

## 2022-03-14 DIAGNOSIS — Z4589 Encounter for adjustment and management of other implanted devices: Secondary | ICD-10-CM | POA: Diagnosis not present

## 2022-03-14 DIAGNOSIS — Z516 Encounter for desensitization to allergens: Secondary | ICD-10-CM | POA: Diagnosis not present

## 2022-03-14 DIAGNOSIS — C50919 Malignant neoplasm of unspecified site of unspecified female breast: Secondary | ICD-10-CM | POA: Diagnosis not present

## 2022-03-14 NOTE — Progress Notes (Signed)
   Follow-Up Visit   Subjective  Dawn Anderson is a 76 y.o. female who presents for the following: Follow-up (Patient here today for 6 month skin check, per patient she has a few lesion that she would like check. Recheck lesion under left axilla that's irritating her, check lesion under left breast and lesion on her back. ).   The following portions of the chart were reviewed this encounter and updated as appropriate:  Tobacco  Allergies  Meds  Problems  Med Hx  Surg Hx  Fam Hx      Objective  Well appearing patient in no apparent distress; mood and affect are within normal limits.  A full examination was performed including scalp, head, eyes, ears, nose, lips, neck, chest, axillae, abdomen, back, buttocks, bilateral upper extremities, bilateral lower extremities, hands, feet, fingers, toes, fingernails, and toenails. All findings within normal limits unless otherwise noted below.  Full body skin examination -No atypical nevi or signs of NMSC noted at the time of the visit.   Left Axilla Stuck-on, crusted plaques.    Assessment & Plan  Encounter for screening for malignant neoplasm of skin  Yearly skin examination   Seborrheic keratosis, inflamed Left Axilla  Destruction of lesion - Left Axilla Complexity: simple   Destruction method: cryotherapy   Informed consent: discussed and consent obtained   Timeout:  patient name, date of birth, surgical site, and procedure verified Lesion destroyed using liquid nitrogen: Yes   Cryotherapy cycles:  3 Outcome: patient tolerated procedure well with no complications      I, Lillar Bianca, PA-C, have reviewed all documentation's for this visit.  The documentation on 03/14/22 for the exam, diagnosis, procedures and orders are all accurate and complete.

## 2022-03-17 DIAGNOSIS — Z20822 Contact with and (suspected) exposure to covid-19: Secondary | ICD-10-CM | POA: Diagnosis not present

## 2022-03-17 DIAGNOSIS — U071 COVID-19: Secondary | ICD-10-CM | POA: Diagnosis not present

## 2022-03-25 DIAGNOSIS — M47816 Spondylosis without myelopathy or radiculopathy, lumbar region: Secondary | ICD-10-CM | POA: Diagnosis not present

## 2022-03-25 DIAGNOSIS — M9902 Segmental and somatic dysfunction of thoracic region: Secondary | ICD-10-CM | POA: Diagnosis not present

## 2022-03-25 DIAGNOSIS — S233XXA Sprain of ligaments of thoracic spine, initial encounter: Secondary | ICD-10-CM | POA: Diagnosis not present

## 2022-03-25 DIAGNOSIS — M47812 Spondylosis without myelopathy or radiculopathy, cervical region: Secondary | ICD-10-CM | POA: Diagnosis not present

## 2022-03-25 DIAGNOSIS — M9903 Segmental and somatic dysfunction of lumbar region: Secondary | ICD-10-CM | POA: Diagnosis not present

## 2022-03-25 DIAGNOSIS — M9901 Segmental and somatic dysfunction of cervical region: Secondary | ICD-10-CM | POA: Diagnosis not present

## 2022-03-27 DIAGNOSIS — M9901 Segmental and somatic dysfunction of cervical region: Secondary | ICD-10-CM | POA: Diagnosis not present

## 2022-03-27 DIAGNOSIS — M9903 Segmental and somatic dysfunction of lumbar region: Secondary | ICD-10-CM | POA: Diagnosis not present

## 2022-03-27 DIAGNOSIS — M47812 Spondylosis without myelopathy or radiculopathy, cervical region: Secondary | ICD-10-CM | POA: Diagnosis not present

## 2022-03-27 DIAGNOSIS — S233XXA Sprain of ligaments of thoracic spine, initial encounter: Secondary | ICD-10-CM | POA: Diagnosis not present

## 2022-03-27 DIAGNOSIS — M47816 Spondylosis without myelopathy or radiculopathy, lumbar region: Secondary | ICD-10-CM | POA: Diagnosis not present

## 2022-03-27 DIAGNOSIS — M9902 Segmental and somatic dysfunction of thoracic region: Secondary | ICD-10-CM | POA: Diagnosis not present

## 2022-03-28 DIAGNOSIS — Z9622 Myringotomy tube(s) status: Secondary | ICD-10-CM | POA: Diagnosis not present

## 2022-03-28 DIAGNOSIS — L299 Pruritus, unspecified: Secondary | ICD-10-CM | POA: Diagnosis not present

## 2022-03-29 ENCOUNTER — Other Ambulatory Visit (INDEPENDENT_AMBULATORY_CARE_PROVIDER_SITE_OTHER): Payer: Self-pay

## 2022-03-29 DIAGNOSIS — Z86010 Personal history of colonic polyps: Secondary | ICD-10-CM

## 2022-04-01 DIAGNOSIS — L821 Other seborrheic keratosis: Secondary | ICD-10-CM | POA: Diagnosis not present

## 2022-04-01 DIAGNOSIS — L728 Other follicular cysts of the skin and subcutaneous tissue: Secondary | ICD-10-CM | POA: Diagnosis not present

## 2022-04-01 DIAGNOSIS — L720 Epidermal cyst: Secondary | ICD-10-CM | POA: Diagnosis not present

## 2022-04-10 ENCOUNTER — Encounter: Payer: Self-pay | Admitting: Adult Health

## 2022-04-10 ENCOUNTER — Ambulatory Visit (INDEPENDENT_AMBULATORY_CARE_PROVIDER_SITE_OTHER): Payer: Medicare PPO | Admitting: Adult Health

## 2022-04-10 VITALS — BP 141/89 | HR 88 | Ht 68.0 in | Wt 207.0 lb

## 2022-04-10 DIAGNOSIS — Z1211 Encounter for screening for malignant neoplasm of colon: Secondary | ICD-10-CM | POA: Diagnosis not present

## 2022-04-10 DIAGNOSIS — M47812 Spondylosis without myelopathy or radiculopathy, cervical region: Secondary | ICD-10-CM | POA: Diagnosis not present

## 2022-04-10 DIAGNOSIS — M9901 Segmental and somatic dysfunction of cervical region: Secondary | ICD-10-CM | POA: Diagnosis not present

## 2022-04-10 DIAGNOSIS — M47816 Spondylosis without myelopathy or radiculopathy, lumbar region: Secondary | ICD-10-CM | POA: Diagnosis not present

## 2022-04-10 DIAGNOSIS — S233XXA Sprain of ligaments of thoracic spine, initial encounter: Secondary | ICD-10-CM | POA: Diagnosis not present

## 2022-04-10 DIAGNOSIS — M9902 Segmental and somatic dysfunction of thoracic region: Secondary | ICD-10-CM | POA: Diagnosis not present

## 2022-04-10 DIAGNOSIS — Z90710 Acquired absence of both cervix and uterus: Secondary | ICD-10-CM | POA: Insufficient documentation

## 2022-04-10 DIAGNOSIS — Z01419 Encounter for gynecological examination (general) (routine) without abnormal findings: Secondary | ICD-10-CM | POA: Diagnosis not present

## 2022-04-10 DIAGNOSIS — M9903 Segmental and somatic dysfunction of lumbar region: Secondary | ICD-10-CM | POA: Diagnosis not present

## 2022-04-10 DIAGNOSIS — Z9011 Acquired absence of right breast and nipple: Secondary | ICD-10-CM

## 2022-04-10 LAB — HEMOCCULT GUIAC POC 1CARD (OFFICE): Fecal Occult Blood, POC: NEGATIVE

## 2022-04-10 NOTE — Progress Notes (Signed)
Patient ID: Dawn Anderson, female   DOB: Nov 02, 1945, 76 y.o.   MRN: [id number] History of Present Illness: Dawn Anderson is a 76 year old white female, widowed, sp hysterectomy, in for well woman gyn exam. She had Breast cancer in 2021 and had right mastectomy, had last chemo, this March and had radiation. She had COVID in August. PCP is Dr Quintin Alto.  Current Medications, Allergies, Past Medical History, Past Surgical History, Family History and Social History were reviewed in [employer] record.     Review of Systems: Patient denies any headaches, hearing loss, blurred vision, shortness of breath, chest pain, abdominal pain, problems with bowel movements, urination, or intercourse(not active). No joint pain or mood swings.  Decrease energy    Physical Exam:BP (!) 141/89 (BP Location: Left Arm, Patient Position: Sitting, Cuff Size: Normal)   Pulse 88   Ht '5\' 8"'$  (1.727 m)   Wt 207 lb (93.9 kg)   BMI 31.47 kg/m   General:  Well developed, well nourished, no acute distress Skin:  Warm and dry Neck:  Midline trachea, normal thyroid, good ROM, no lymphadenopathy,no carotid bruits heard Lungs; Clear to auscultation bilaterally Breast:  No dominant palpable mass, retraction, or nipple discharge, on the left and right breast is surgically absent  Cardiovascular: Regular rate and rhythm Abdomen:  Soft, non tender, no hepatosplenomegaly Pelvic:  External genitalia is normal in appearance, no lesions.  The vagina is pale with loss of rugae. Urethra has no lesions or masses. The cervix and uterus are absent.  No adnexal masses or tenderness noted.Bladder is non tender, no masses felt. Rectal: Good sphincter tone, no polyps, or hemorrhoids felt.  Hemoccult negative. Extremities/musculoskeletal:  No swelling or varicosities noted, no clubbing or cyanosis Psych:  No mood changes, alert and cooperative,seems happy AA is 0 Fall risk is low    04/10/2022   11:13 AM 04/25/2020   10:55 AM  12/10/2017    1:41 PM  Depression screen PHQ 2/9  Decreased Interest 0 0 0  Down, Depressed, Hopeless 0 0 1  PHQ - 2 Score 0 0 1  Altered sleeping 0 0   Tired, decreased energy 2 1   Change in appetite 0 0   Feeling bad or failure about yourself  0 0   Trouble concentrating 0 0   Moving slowly or fidgety/restless 0 0   Suicidal thoughts 0 0   PHQ-9 Score 2 1        04/10/2022   11:13 AM 04/25/2020   10:55 AM  GAD 7 : Generalized Anxiety Score  Nervous, Anxious, on Edge 1 1  Control/stop worrying 0 0  Worry too much - different things 0 0  Trouble relaxing 1 0  Restless 0 1  Easily annoyed or irritable 0 1  Afraid - awful might happen 0 0  Total GAD 7 Score 2 3    Examination chaperoned by Levy Pupa LPN  Impression and Plan: 1. Encounter for well woman exam with routine gynecological exam GYN physical in 2 years  Labs with PCP She gets mammogram in [place] Has colonoscopy scheduled for 05/20/22   2. Encounter for screening fecal occult blood testing Hemoccult was negative   3. S/P hysterectomy  4. History of modified radical mastectomy of right breast On femara

## 2022-04-11 DIAGNOSIS — J4542 Moderate persistent asthma with status asthmaticus: Secondary | ICD-10-CM | POA: Diagnosis not present

## 2022-04-11 DIAGNOSIS — J45909 Unspecified asthma, uncomplicated: Secondary | ICD-10-CM | POA: Diagnosis not present

## 2022-04-11 DIAGNOSIS — Z516 Encounter for desensitization to allergens: Secondary | ICD-10-CM | POA: Diagnosis not present

## 2022-04-11 DIAGNOSIS — J4550 Severe persistent asthma, uncomplicated: Secondary | ICD-10-CM | POA: Diagnosis not present

## 2022-04-19 ENCOUNTER — Telehealth (INDEPENDENT_AMBULATORY_CARE_PROVIDER_SITE_OTHER): Payer: Self-pay

## 2022-04-19 ENCOUNTER — Encounter (INDEPENDENT_AMBULATORY_CARE_PROVIDER_SITE_OTHER): Payer: Self-pay

## 2022-04-19 MED ORDER — PEG 3350-KCL-NA BICARB-NACL 420 G PO SOLR: 4000.0000 mL | ORAL | 0 refills | Status: DC

## 2022-04-19 NOTE — Telephone Encounter (Signed)
Referring MD/PCP: Dr Consuello Masse  Procedure: Tcs  Reason/Indication:  hx of polyps, RECALL  Has patient had this procedure before?  Yes   If so, when, by whom and where?  7 years ago  Is there a family history of colon cancer?  NO   Who?  What age when diagnosed?    Is patient diabetic? If yes, Type 1 or Type 2   yes, type 2      Does patient have prosthetic heart valve or mechanical valve?  No   Do you have a pacemaker/defibrillator?  No   Has patient ever had endocarditis/atrial fibrillation? Yes  Does patient use oxygen? No   Has patient had joint replacement within last 12 months?  NO   Is patient constipated or do they take laxatives? Yes  Does patient have a history of alcohol/drug use?  No   Have you had a stroke/heart attack last 6 mths? No   Do you take medicine for weight loss?  NO   For female patients,: have you had a hysterectomy yes                       are you post menopausal yes                       do you still have your menstrual cycle no   Is patient on blood thinner such as Coumadin, Plavix and/or Aspirin? Yes   Medications: Xarelto '20mg'$  daily, mounjaro 7.5 mg weekly, letrozole 2.5 mg daily, pregabalin 40 mg tid, lorazepam 1 mg bid prn, acsorbic acid (Vit C) 1000 mg daily, Vit D3 300 mg daily, Osteo Bi Flex daily, Magnesium 500 mg daily, Co Q 10 100 mg daily, Melatonin 10 mg qhs, Xolair 150 mg every 28 days, Vit B daily, MVI w/iron daily, metformin 500 mg 24 hr 500 mg bid, Ca phos cranberry milk thistle cap daily, acetaminophen 500 mg q 6 hrs prn pain, rivaroxaban 20 mg tab daily, mycology ii cream prn, montelukast 10 mg daily, losartan/hctz 50-12.5 mg take 0.5 daily, symbicort 160-4.5 mcg/actuation inhaler 2 puggs bid prn, epinephrine 0.3 mg 0.3 ml inj once, garlic 5456 mg cap daily, oxpenex HFA 45 mcg/actuation inhaler 2 puffs q 4 hrs prn   Allergies: NKDA  Medication Adjustment per  Dr Jenetta Downer HOLD MOUNJARO 7 DAYS PRIOR NO METFORMIN THE EVENING  PRIOR OR MORNING OF, HOLD XARELTO 2 DAYS PRIOR   Procedure date & time: 05/21/22 AT 7:30 AM

## 2022-04-19 NOTE — Telephone Encounter (Signed)
Zebulon Gantt Ann Melody Savidge, CMA  ?

## 2022-04-29 DIAGNOSIS — M47812 Spondylosis without myelopathy or radiculopathy, cervical region: Secondary | ICD-10-CM | POA: Diagnosis not present

## 2022-04-29 DIAGNOSIS — M9902 Segmental and somatic dysfunction of thoracic region: Secondary | ICD-10-CM | POA: Diagnosis not present

## 2022-04-29 DIAGNOSIS — M47816 Spondylosis without myelopathy or radiculopathy, lumbar region: Secondary | ICD-10-CM | POA: Diagnosis not present

## 2022-04-29 DIAGNOSIS — M9901 Segmental and somatic dysfunction of cervical region: Secondary | ICD-10-CM | POA: Diagnosis not present

## 2022-04-29 DIAGNOSIS — S233XXA Sprain of ligaments of thoracic spine, initial encounter: Secondary | ICD-10-CM | POA: Diagnosis not present

## 2022-04-29 DIAGNOSIS — M9903 Segmental and somatic dysfunction of lumbar region: Secondary | ICD-10-CM | POA: Diagnosis not present

## 2022-05-02 DIAGNOSIS — M47812 Spondylosis without myelopathy or radiculopathy, cervical region: Secondary | ICD-10-CM | POA: Diagnosis not present

## 2022-05-02 DIAGNOSIS — M9901 Segmental and somatic dysfunction of cervical region: Secondary | ICD-10-CM | POA: Diagnosis not present

## 2022-05-02 DIAGNOSIS — M47816 Spondylosis without myelopathy or radiculopathy, lumbar region: Secondary | ICD-10-CM | POA: Diagnosis not present

## 2022-05-02 DIAGNOSIS — S233XXA Sprain of ligaments of thoracic spine, initial encounter: Secondary | ICD-10-CM | POA: Diagnosis not present

## 2022-05-02 DIAGNOSIS — M9902 Segmental and somatic dysfunction of thoracic region: Secondary | ICD-10-CM | POA: Diagnosis not present

## 2022-05-02 DIAGNOSIS — M9903 Segmental and somatic dysfunction of lumbar region: Secondary | ICD-10-CM | POA: Diagnosis not present

## 2022-05-08 DIAGNOSIS — M9901 Segmental and somatic dysfunction of cervical region: Secondary | ICD-10-CM | POA: Diagnosis not present

## 2022-05-08 DIAGNOSIS — S233XXA Sprain of ligaments of thoracic spine, initial encounter: Secondary | ICD-10-CM | POA: Diagnosis not present

## 2022-05-08 DIAGNOSIS — M47812 Spondylosis without myelopathy or radiculopathy, cervical region: Secondary | ICD-10-CM | POA: Diagnosis not present

## 2022-05-08 DIAGNOSIS — M47816 Spondylosis without myelopathy or radiculopathy, lumbar region: Secondary | ICD-10-CM | POA: Diagnosis not present

## 2022-05-08 DIAGNOSIS — M9903 Segmental and somatic dysfunction of lumbar region: Secondary | ICD-10-CM | POA: Diagnosis not present

## 2022-05-08 DIAGNOSIS — M9902 Segmental and somatic dysfunction of thoracic region: Secondary | ICD-10-CM | POA: Diagnosis not present

## 2022-05-09 DIAGNOSIS — J4542 Moderate persistent asthma with status asthmaticus: Secondary | ICD-10-CM | POA: Diagnosis not present

## 2022-05-09 DIAGNOSIS — Z516 Encounter for desensitization to allergens: Secondary | ICD-10-CM | POA: Diagnosis not present

## 2022-05-09 DIAGNOSIS — J45909 Unspecified asthma, uncomplicated: Secondary | ICD-10-CM | POA: Diagnosis not present

## 2022-05-09 DIAGNOSIS — L821 Other seborrheic keratosis: Secondary | ICD-10-CM | POA: Diagnosis not present

## 2022-05-09 DIAGNOSIS — Z23 Encounter for immunization: Secondary | ICD-10-CM | POA: Diagnosis not present

## 2022-05-09 DIAGNOSIS — L720 Epidermal cyst: Secondary | ICD-10-CM | POA: Diagnosis not present

## 2022-05-10 DIAGNOSIS — M9903 Segmental and somatic dysfunction of lumbar region: Secondary | ICD-10-CM | POA: Diagnosis not present

## 2022-05-10 DIAGNOSIS — M9902 Segmental and somatic dysfunction of thoracic region: Secondary | ICD-10-CM | POA: Diagnosis not present

## 2022-05-10 DIAGNOSIS — M47812 Spondylosis without myelopathy or radiculopathy, cervical region: Secondary | ICD-10-CM | POA: Diagnosis not present

## 2022-05-10 DIAGNOSIS — M9901 Segmental and somatic dysfunction of cervical region: Secondary | ICD-10-CM | POA: Diagnosis not present

## 2022-05-10 DIAGNOSIS — S233XXA Sprain of ligaments of thoracic spine, initial encounter: Secondary | ICD-10-CM | POA: Diagnosis not present

## 2022-05-10 DIAGNOSIS — M47816 Spondylosis without myelopathy or radiculopathy, lumbar region: Secondary | ICD-10-CM | POA: Diagnosis not present

## 2022-05-14 DIAGNOSIS — M9901 Segmental and somatic dysfunction of cervical region: Secondary | ICD-10-CM | POA: Diagnosis not present

## 2022-05-14 DIAGNOSIS — M9902 Segmental and somatic dysfunction of thoracic region: Secondary | ICD-10-CM | POA: Diagnosis not present

## 2022-05-14 DIAGNOSIS — S233XXA Sprain of ligaments of thoracic spine, initial encounter: Secondary | ICD-10-CM | POA: Diagnosis not present

## 2022-05-14 DIAGNOSIS — M47812 Spondylosis without myelopathy or radiculopathy, cervical region: Secondary | ICD-10-CM | POA: Diagnosis not present

## 2022-05-14 DIAGNOSIS — M47816 Spondylosis without myelopathy or radiculopathy, lumbar region: Secondary | ICD-10-CM | POA: Diagnosis not present

## 2022-05-14 DIAGNOSIS — M9903 Segmental and somatic dysfunction of lumbar region: Secondary | ICD-10-CM | POA: Diagnosis not present

## 2022-05-14 NOTE — Patient Instructions (Signed)
Dawn Anderson  05/14/2022     '@PREFPERIOPPHARMACY'$ @   Your procedure is scheduled on  05/21/2022.   Report to [facility] at  0600 A.M.   Call this number if you have problems the morning of surgery:  [phone]   Remember:  Follow the diet and prep instructions given to you by the office.      Your last dose of mounjaro should be on 10/10 or before and your last dose of Xarelto should be on 10/14.      Use your inhaler before you come and bring your rescue inhaler with you.     DO NOT take any metformin the night before or the morning of your procedure, per office letter.     Take these medicines the morning of surgery with A SIP OF WATER                 femara, claritin, ativan (if needed), lyrica.     Do not wear jewelry, make-up or nail polish.  Do not wear lotions, powders, or perfumes, or deodorant.  Do not shave 48 hours prior to surgery.  Men may shave face and neck.  Do not bring valuables to the hospital.  [facility] is not responsible for any belongings or valuables.  Contacts, dentures or bridgework may not be worn into surgery.  Leave your suitcase in the car.  After surgery it may be brought to your room.  For patients admitted to the hospital, discharge time will be determined by your treatment team.  Patients discharged the day of surgery will not be allowed to drive home and must have someone with them for 24 hours.    Special instructions:   DO NOT smoke tobacco or vape for 24 hours before your procedure.  Please read over the following fact sheets that you were given. Anesthesia Post-op Instructions and Care and Recovery After Surgery      Colonoscopy, Adult, Care After The following information offers guidance on how to care for yourself after your procedure. Your health care provider may also give you more specific instructions. If you have problems or questions, contact your health care provider. What can I expect after the  procedure? After the procedure, it is common to have: A small amount of blood in your stool for 24 hours after the procedure. Some gas. Mild cramping or bloating of your abdomen. Follow these instructions at home: Eating and drinking  Drink enough fluid to keep your urine pale yellow. Follow instructions from your health care provider about eating or drinking restrictions. Resume your normal diet as told by your health care provider. Avoid heavy or fried foods that are hard to digest. Activity Rest as told by your health care provider. Avoid sitting for a long time without moving. Get up to take short walks every 1-2 hours. This is important to improve blood flow and breathing. Ask for help if you feel weak or unsteady. Return to your normal activities as told by your health care provider. Ask your health care provider what activities are safe for you. Managing cramping and bloating  Try walking around when you have cramps or feel bloated. If directed, apply heat to your abdomen as told by your health care provider. Use the heat source that your health care provider recommends, such as a moist heat pack or a heating pad. Place a towel between your skin and the heat source. Leave the heat on for 20-30  minutes. Remove the heat if your skin turns bright red. This is especially important if you are unable to feel pain, heat, or cold. You have a greater risk of getting burned. General instructions If you were given a sedative during the procedure, it can affect you for several hours. Do not drive or operate machinery until your health care provider says that it is safe. For the first 24 hours after the procedure: Do not sign important documents. Do not drink alcohol. Do your regular daily activities at a slower pace than normal. Eat soft foods that are easy to digest. Take over-the-counter and prescription medicines only as told by your health care provider. Keep all follow-up visits. This  is important. Contact a health care provider if: You have blood in your stool 2-3 days after the procedure. Get help right away if: You have more than a small spotting of blood in your stool. You have large blood clots in your stool. You have swelling of your abdomen. You have nausea or vomiting. You have a fever. You have increasing pain in your abdomen that is not relieved with medicine. These symptoms may be an emergency. Get help right away. Call 911. Do not wait to see if the symptoms will go away. Do not drive yourself to the hospital. Summary After the procedure, it is common to have a small amount of blood in your stool. You may also have mild cramping and bloating of your abdomen. If you were given a sedative during the procedure, it can affect you for several hours. Do not drive or operate machinery until your health care provider says that it is safe. Get help right away if you have a lot of blood in your stool, nausea or vomiting, a fever, or increased pain in your abdomen. This information is not intended to replace advice given to you by your health care provider. Make sure you discuss any questions you have with your health care provider. Document Revised: 03/14/2021 Document Reviewed: 03/14/2021 Elsevier Patient Education  [employer] This sheet gives you information about how to care for yourself after your procedure. Your health care provider may also give you more specific instructions. If you have problems or questions, contact your health care provider. What can I expect after the procedure? After the procedure, it is common to have: Tiredness. Forgetfulness about what happened after the procedure. Impaired judgment for important decisions. Nausea or vomiting. Some difficulty with balance. Follow these instructions at home: For the time period you were told by your health care provider:     Rest as needed. Do not  participate in activities where you could fall or become injured. Do not drive or use machinery. Do not drink alcohol. Do not take sleeping pills or medicines that cause drowsiness. Do not make important decisions or sign legal documents. Do not take care of children on your own. Eating and drinking Follow the diet that is recommended by your health care provider. Drink enough fluid to keep your urine pale yellow. If you vomit: Drink water, juice, or soup when you can drink without vomiting. Make sure you have little or no nausea before eating solid foods. General instructions Have a responsible adult stay with you for the time you are told. It is important to have someone help care for you until you are awake and alert. Take over-the-counter and prescription medicines only as told by your health care provider. If you have sleep apnea, surgery  and certain medicines can increase your risk for breathing problems. Follow instructions from your health care provider about wearing your sleep device: Anytime you are sleeping, including during daytime naps. While taking prescription pain medicines, sleeping medicines, or medicines that make you drowsy. Avoid smoking. Keep all follow-up visits as told by your health care provider. This is important. Contact a health care provider if: You keep feeling nauseous or you keep vomiting. You feel light-headed. You are still sleepy or having trouble with balance after 24 hours. You develop a rash. You have a fever. You have redness or swelling around the IV site. Get help right away if: You have trouble breathing. You have new-onset confusion at home. Summary For several hours after your procedure, you may feel tired. You may also be forgetful and have poor judgment. Have a responsible adult stay with you for the time you are told. It is important to have someone help care for you until you are awake and alert. Rest as told. Do not drive or operate  machinery. Do not drink alcohol or take sleeping pills. Get help right away if you have trouble breathing, or if you suddenly become confused. This information is not intended to replace advice given to you by your health care provider. Make sure you discuss any questions you have with your health care provider. Document Revised: 06/26/2021 Document Reviewed: 06/24/2019 Elsevier Patient Education  [place].

## 2022-05-16 ENCOUNTER — Other Ambulatory Visit: Payer: Self-pay

## 2022-05-16 ENCOUNTER — Encounter (HOSPITAL_COMMUNITY)
Admission: RE | Admit: 2022-05-16 | Discharge: 2022-05-16 | Disposition: A | Discharge status: Home / Self Care | Payer: Medicare PPO | Source: Ambulatory Visit | Attending: Gastroenterology | Admitting: Gastroenterology

## 2022-05-16 ENCOUNTER — Encounter (HOSPITAL_COMMUNITY): Payer: Self-pay

## 2022-05-16 DIAGNOSIS — M47816 Spondylosis without myelopathy or radiculopathy, lumbar region: Secondary | ICD-10-CM | POA: Diagnosis not present

## 2022-05-16 DIAGNOSIS — M9902 Segmental and somatic dysfunction of thoracic region: Secondary | ICD-10-CM | POA: Diagnosis not present

## 2022-05-16 DIAGNOSIS — Z86010 Personal history of colonic polyps: Secondary | ICD-10-CM | POA: Diagnosis not present

## 2022-05-16 DIAGNOSIS — S233XXA Sprain of ligaments of thoracic spine, initial encounter: Secondary | ICD-10-CM | POA: Diagnosis not present

## 2022-05-16 DIAGNOSIS — M9901 Segmental and somatic dysfunction of cervical region: Secondary | ICD-10-CM | POA: Diagnosis not present

## 2022-05-16 DIAGNOSIS — Z01812 Encounter for preprocedural laboratory examination: Secondary | ICD-10-CM | POA: Insufficient documentation

## 2022-05-16 DIAGNOSIS — M47812 Spondylosis without myelopathy or radiculopathy, cervical region: Secondary | ICD-10-CM | POA: Diagnosis not present

## 2022-05-16 DIAGNOSIS — M9903 Segmental and somatic dysfunction of lumbar region: Secondary | ICD-10-CM | POA: Diagnosis not present

## 2022-05-16 HISTORY — DX: Sleep apnea, unspecified: G47.30

## 2022-05-16 LAB — BASIC METABOLIC PANEL
Anion gap: 10 (ref 5–15)
BUN: 19 mg/dL (ref 8–23)
CO2: 26 mmol/L (ref 22–32)
Calcium: 9.7 mg/dL (ref 8.9–10.3)
Chloride: 102 mmol/L (ref 98–111)
Creatinine, Ser: 0.74 mg/dL (ref 0.44–1.00)
GFR, Estimated: >60 mL/min (ref >60)
Glucose, Bld: 106 mg/dL — ABNORMAL HIGH (ref 70–99)
Potassium: 3.9 mmol/L (ref 3.5–5.1)
Sodium: 138 mmol/L (ref 135–145)

## 2022-05-17 DIAGNOSIS — S233XXA Sprain of ligaments of thoracic spine, initial encounter: Secondary | ICD-10-CM | POA: Diagnosis not present

## 2022-05-17 DIAGNOSIS — M47812 Spondylosis without myelopathy or radiculopathy, cervical region: Secondary | ICD-10-CM | POA: Diagnosis not present

## 2022-05-17 DIAGNOSIS — M9901 Segmental and somatic dysfunction of cervical region: Secondary | ICD-10-CM | POA: Diagnosis not present

## 2022-05-17 DIAGNOSIS — M47816 Spondylosis without myelopathy or radiculopathy, lumbar region: Secondary | ICD-10-CM | POA: Diagnosis not present

## 2022-05-17 DIAGNOSIS — M9902 Segmental and somatic dysfunction of thoracic region: Secondary | ICD-10-CM | POA: Diagnosis not present

## 2022-05-17 DIAGNOSIS — M9903 Segmental and somatic dysfunction of lumbar region: Secondary | ICD-10-CM | POA: Diagnosis not present

## 2022-05-21 ENCOUNTER — Ambulatory Visit (HOSPITAL_COMMUNITY)
Admission: RE | Admit: 2022-05-21 | Discharge: 2022-05-21 | Disposition: A | Discharge status: Home / Self Care | Payer: Medicare PPO | Source: Ambulatory Visit | Attending: Gastroenterology | Admitting: Gastroenterology

## 2022-05-21 ENCOUNTER — Encounter (HOSPITAL_COMMUNITY)
Admission: RE | Disposition: A | Discharge status: Home / Self Care | Payer: Self-pay | Source: Ambulatory Visit | Attending: Gastroenterology

## 2022-05-21 ENCOUNTER — Encounter (HOSPITAL_COMMUNITY): Payer: Self-pay | Admitting: Gastroenterology

## 2022-05-21 ENCOUNTER — Ambulatory Visit (HOSPITAL_BASED_OUTPATIENT_CLINIC_OR_DEPARTMENT_OTHER): Payer: Medicare PPO | Admitting: Anesthesiology

## 2022-05-21 ENCOUNTER — Ambulatory Visit (HOSPITAL_COMMUNITY): Payer: Medicare PPO | Admitting: Anesthesiology

## 2022-05-21 ENCOUNTER — Other Ambulatory Visit: Payer: Self-pay

## 2022-05-21 DIAGNOSIS — D123 Benign neoplasm of transverse colon: Secondary | ICD-10-CM | POA: Diagnosis not present

## 2022-05-21 DIAGNOSIS — E119 Type 2 diabetes mellitus without complications: Secondary | ICD-10-CM | POA: Insufficient documentation

## 2022-05-21 DIAGNOSIS — I10 Essential (primary) hypertension: Secondary | ICD-10-CM | POA: Diagnosis not present

## 2022-05-21 DIAGNOSIS — D122 Benign neoplasm of ascending colon: Secondary | ICD-10-CM | POA: Diagnosis not present

## 2022-05-21 DIAGNOSIS — I4891 Unspecified atrial fibrillation: Secondary | ICD-10-CM | POA: Diagnosis not present

## 2022-05-21 DIAGNOSIS — G4730 Sleep apnea, unspecified: Secondary | ICD-10-CM | POA: Insufficient documentation

## 2022-05-21 DIAGNOSIS — K635 Polyp of colon: Secondary | ICD-10-CM

## 2022-05-21 DIAGNOSIS — Z86010 Personal history of colonic polyps: Secondary | ICD-10-CM | POA: Insufficient documentation

## 2022-05-21 DIAGNOSIS — K5730 Diverticulosis of large intestine without perforation or abscess without bleeding: Secondary | ICD-10-CM

## 2022-05-21 DIAGNOSIS — Z1211 Encounter for screening for malignant neoplasm of colon: Secondary | ICD-10-CM | POA: Diagnosis not present

## 2022-05-21 DIAGNOSIS — K648 Other hemorrhoids: Secondary | ICD-10-CM | POA: Diagnosis not present

## 2022-05-21 HISTORY — PX: POLYPECTOMY: SHX5525

## 2022-05-21 HISTORY — PX: COLONOSCOPY WITH PROPOFOL: SHX5780

## 2022-05-21 LAB — HM COLONOSCOPY

## 2022-05-21 LAB — GLUCOSE, CAPILLARY: Glucose-Capillary: 89 mg/dL (ref 70–99)

## 2022-05-21 SURGERY — COLONOSCOPY WITH PROPOFOL
Anesthesia: General

## 2022-05-21 MED ORDER — PROPOFOL 10 MG/ML IV BOLUS
INTRAVENOUS | Status: DC | PRN
  Administered 2022-05-21: 40 mg via INTRAVENOUS
  Administered 2022-05-21: 20 mg via INTRAVENOUS
  Administered 2022-05-21: 40 mg via INTRAVENOUS
  Administered 2022-05-21: 150 mg via INTRAVENOUS
  Administered 2022-05-21: 50 mg via INTRAVENOUS

## 2022-05-21 MED ORDER — LIDOCAINE HCL (CARDIAC) PF 50 MG/5ML IV SOSY
PREFILLED_SYRINGE | INTRAVENOUS | Status: DC | PRN
  Administered 2022-05-21: 50 mg via INTRAVENOUS

## 2022-05-21 MED ORDER — LACTATED RINGERS IV SOLN: INTRAVENOUS | Status: DC

## 2022-05-21 MED ORDER — PROPOFOL 500 MG/50ML IV EMUL
INTRAVENOUS | Status: DC | PRN
  Administered 2022-05-21: 100 ug/kg/min via INTRAVENOUS

## 2022-05-21 NOTE — Anesthesia Procedure Notes (Signed)
Date/Time: 05/21/2022 7:37 AM  Performed by: Vista Deck, CRNAPre-anesthesia Checklist: Patient identified, Emergency Drugs available, Suction available, Timeout performed and Patient being monitored Patient Re-evaluated:Patient Re-evaluated prior to induction Oxygen Delivery Method: Nasal Cannula

## 2022-05-21 NOTE — Transfer of Care (Signed)
Immediate Anesthesia Transfer of Care Note  Patient: Dawn Anderson  Procedure(s) Performed: COLONOSCOPY WITH PROPOFOL POLYPECTOMY  Patient Location: Short Stay  Anesthesia Type:General  Level of Consciousness: awake and patient cooperative  Airway & Oxygen Therapy: Patient Spontanous Breathing  Post-op Assessment: Report given to RN and Post -op Vital signs reviewed and stable  Post vital signs: Reviewed and stable  Last Vitals:  Vitals Value Taken Time  BP 123/65 05/21/22 0817  Temp 36.4 C 05/21/22 0817  Pulse 81 05/21/22 0817  Resp 17 05/21/22 0817  SpO2 96 % 05/21/22 0817    Last Pain:  Vitals:   05/21/22 0817  TempSrc: Oral  PainSc: 0-No pain         Complications: No notable events documented.

## 2022-05-21 NOTE — H&P (Signed)
Dawn Anderson is an 76 y.o. female.   Chief Complaint: history of colon polyps HPI: 76 y/o F with PMH DM, coming for history of colonic polyps.  Last colonoscopy was in 2016, 3 polyps were removed from the rectum, these were hyperplastic polyps.  The patient denies having any complaints such as melena, hematochezia, abdominal pain or distention, change in her bowel movement consistency or frequency, no changes in weight recently.  No family history of colorectal cancer.   Past Medical History:  Diagnosis Date   Arthritis    Asthma    Atypical mole 03/21/2006   moderate atypia - left calf   Atypical mole 03/21/2006   moderate atypia - left pec   Basal cell carcinoma 03/13/1999   mid to upper back   Breast cancer ([place]) 07/12/2020   right   Diabetes mellitus without complication (HCC)    Sleep apnea    8/4    Past Surgical History:  Procedure Laterality Date   ABDOMINAL HYSTERECTOMY     BACK SURGERY     x2   BREAST SURGERY Left    lump removed   CHOLECYSTECTOMY     COLONOSCOPY N/A 03/22/2015   Procedure: COLONOSCOPY;  Surgeon: Rogene [place], MD;  Location: [facility];  Service: Endoscopy;  Laterality: N/A;  830 - moved to 8/17 @ 7:30 - Ann to notify pt   MASTECTOMY Right 07/12/2020   09/2020   NASAL SINUS SURGERY     SHOULDER ARTHROSCOPY W/ ROTATOR CUFF REPAIR Right     Family History  Problem Relation Age of Onset   Alzheimer's disease Mother    Kidney disease Father    Heart disease Father    Ehlers-Danlos syndrome Son    Social History:  reports that she has never smoked. She has never used smokeless tobacco. She reports that she does not drink alcohol and does not use drugs.  Allergies:  Allergies  Allergen Reactions   Albuterol Sulfate Anaphylaxis   Apple Juice Anaphylaxis   Asa [Aspirin] Anaphylaxis   Coconut (Cocos Nucifera) Itching   Origanum Oil Anaphylaxis   Pineapple Anaphylaxis   Plastibase Itching   Prunus Persica Anaphylaxis    Peach*    Salicylates Anaphylaxis   Salmeterol Anaphylaxis    Advair    Strawberry (Diagnostic) Anaphylaxis   Sulfamethizole Itching   Chocolate Hives   Clarithromycin Rash   Penicillins Nausea Only and Rash   Latex Itching   Morphine Itching    Medications Prior to Admission  Medication Sig Dispense Refill   ACCU-CHEK GUIDE test strip USE TO TEST ONCE DAILY     acetaminophen (TYLENOL) 500 MG tablet Take 1,000 mg by mouth every 8 (eight) hours as needed for moderate pain.     Ascorbic Acid (VITAMIN C) 1000 MG tablet Take 1,000 mg by mouth daily.     B Complex Vitamins (VITAMIN-B COMPLEX PO) Take 1 tablet by mouth daily.     Bepotastine Besilate 1.5 % SOLN Place 1 drop into both eyes 2 (two) times daily as needed (dry eyes).     cholecalciferol (VITAMIN D3) 25 MCG (1000 UNIT) tablet Take 1,000 Units by mouth daily.     Coenzyme Q10 100 MG capsule Take 100 mg by mouth daily.     EPINEPHrine 0.3 mg/0.3 mL IJ SOAJ injection Inject 0.3 mg into the muscle as needed for anaphylaxis.     fluticasone (FLONASE) 50 MCG/ACT nasal spray Place 1 spray into both nostrils in the morning and at  bedtime.     letrozole (FEMARA) 2.5 MG tablet Take 2.5 mg by mouth daily.     loratadine (CLARITIN) 10 MG tablet Take 10 mg by mouth daily.     LORazepam (ATIVAN) 1 MG tablet Take 1 mg by mouth every 8 (eight) hours as needed for anxiety.     losartan-hydrochlorothiazide (HYZAAR) 50-12.5 MG tablet Take 1 tablet by mouth every morning.  2   Melatonin 10 MG CAPS Take 10 mg by mouth at bedtime.     metFORMIN (GLUCOPHAGE-XR) 500 MG 24 hr tablet Take 500 mg by mouth 2 (two) times daily.     Milk Thistle 175 MG CAPS Take 175 mg by mouth daily.     montelukast (SINGULAIR) 10 MG tablet Take 10 mg by mouth at bedtime.     MOUNJARO 7.5 MG/0.5ML Pen Inject 7.5 mg into the skin every Wednesday.     Multiple Vitamin (MULTIVITAMIN) tablet Take 1 tablet by mouth daily.     NON FORMULARY CPAP MACHINE     nystatin-triamcinolone  (MYCOLOG II) cream Apply 1 application topically 2 (two) times daily. Apply thin layer bid prn (Patient taking differently: Apply 1 application  topically 2 (two) times daily as needed (irritation).) 30 g 3   polyethylene glycol-electrolytes (TRILYTE) 420 g solution Take 4,000 mLs by mouth as directed. 4000 mL 0   pregabalin (LYRICA) 50 MG capsule Take 50 mg by mouth 2 (two) times daily.     rivaroxaban (XARELTO) 20 MG TABS tablet TAKE 1 TABLET BY MOUTH DAILY WITH SUPPER (Patient taking differently: 20 mg every morning.) 30 tablet 5   Spacer/Aero-Holding Chambers (VORTEX VALVED HOLDING CHAMBER) DEVI USE with mdi     SYMBICORT 160-4.5 MCG/ACT inhaler Inhale 2 puffs into the lungs 2 (two) times daily.     XOLAIR 150 MG/ML prefilled syringe Inject 300 mg into the skin every 28 (twenty-eight) days.     XOPENEX HFA 45 MCG/ACT inhaler Inhale 2 puffs into the lungs every 6 (six) hours as needed for shortness of breath or wheezing.      Results for orders placed or performed during the hospital encounter of 05/21/22 (from the past 48 hour(s))  Glucose, capillary     Status: None   Collection Time: 05/21/22  6:21 AM  Result Value Ref Range   Glucose-Capillary 89 70 - 99 mg/dL    Comment: Glucose reference range applies only to samples taken after fasting for at least 8 hours.   No results found.  Review of Systems  All other systems reviewed and are negative.   Blood pressure (!) 143/75, pulse 79, temperature 98 F (36.7 C), temperature source Oral, resp. rate 16, height '5\' 8"'$  (1.727 m), weight 93 kg, SpO2 100 %. Physical Exam  GENERAL: The patient is AO x3, in no acute distress. HEENT: Head is normocephalic and atraumatic. EOMI are intact. Mouth is well hydrated and without lesions. NECK: Supple. No masses LUNGS: Clear to auscultation. No presence of rhonchi/wheezing/rales. Adequate chest expansion HEART: RRR, normal s1 and s2. ABDOMEN: Soft, nontender, no guarding, no peritoneal signs, and  nondistended. BS +. No masses. EXTREMITIES: Without any cyanosis, clubbing, rash, lesions or edema. NEUROLOGIC: AOx3, no focal motor deficit. SKIN: no jaundice, no rashes  Assessment/Plan 76 y/o F with PMH DM, coming for history of colonic polyps.  We will proceed with colonoscopy.  Harvel Quale, MD 05/21/2022, 7:34 AM

## 2022-05-21 NOTE — Anesthesia Preprocedure Evaluation (Signed)
Anesthesia Evaluation  Patient identified by MRN, date of birth, ID band Patient awake    Reviewed: Allergy & Precautions, H&P , NPO status , Patient's Chart, lab work & pertinent test results, reviewed documented beta blocker date and time   Airway Mallampati: II  TM Distance: >3 FB Neck ROM: full    Dental no notable dental hx.    Pulmonary sleep apnea and Continuous Positive Airway Pressure Ventilation ,    Pulmonary exam normal breath sounds clear to auscultation       Cardiovascular Exercise Tolerance: Good hypertension, + dysrhythmias Atrial Fibrillation  Rhythm:regular Rate:Normal     Neuro/Psych negative neurological ROS  negative psych ROS   GI/Hepatic negative GI ROS, Neg liver ROS,   Endo/Other  negative endocrine ROSdiabetes, Type 2  Renal/GU negative Renal ROS  negative genitourinary   Musculoskeletal   Abdominal   Peds  Hematology negative hematology ROS (+)   Anesthesia Other Findings   Reproductive/Obstetrics negative OB ROS                             Anesthesia Physical Anesthesia Plan  ASA: 3  Anesthesia Plan: General   Post-op Pain Management:    Induction:   PONV Risk Score and Plan: Propofol infusion  Airway Management Planned:   Additional Equipment:   Intra-op Plan:   Post-operative Plan:   Informed Consent: I have reviewed the patients History and Physical, chart, labs and discussed the procedure including the risks, benefits and alternatives for the proposed anesthesia with the patient or authorized representative who has indicated his/her understanding and acceptance.     Dental Advisory Given  Plan Discussed with: CRNA  Anesthesia Plan Comments:         Anesthesia Quick Evaluation

## 2022-05-21 NOTE — Anesthesia Postprocedure Evaluation (Signed)
Anesthesia Post Note  Patient: Dawn Anderson  Procedure(s) Performed: COLONOSCOPY WITH PROPOFOL POLYPECTOMY  Patient location during evaluation: Phase II Anesthesia Type: General Level of consciousness: awake Pain management: pain level controlled Vital Signs Assessment: post-procedure vital signs reviewed and stable Respiratory status: spontaneous breathing and respiratory function stable Cardiovascular status: blood pressure returned to baseline and stable Postop Assessment: no headache and no apparent nausea or vomiting Anesthetic complications: no Comments: Late entry   No notable events documented.   Last Vitals:  Vitals:   05/21/22 0655 05/21/22 0817  BP: (!) 143/75 123/65  Pulse: 79 81  Resp: 16 17  Temp: 36.7 C 36.4 C  SpO2: 100% 96%    Last Pain:  Vitals:   05/21/22 0817  TempSrc: Oral  PainSc: 0-No pain                 Louann Sjogren

## 2022-05-21 NOTE — Discharge Instructions (Addendum)
You are being discharged to home.  Resume your previous diet.  We are waiting for your pathology results.  Your physician has recommended a repeat colonoscopy for surveillance based on pathology results.  Restart Xarelto tonight.

## 2022-05-21 NOTE — Op Note (Signed)
[facility] Patient Name: Dawn Anderson Procedure Date: 05/21/2022 7:13 AM MRN: [id number] Date of Birth: Jan 17, 1946 Attending MD: Maylon Peppers ,  CSN: [id number] Age: 76 Admit Type: Outpatient Procedure:                Colonoscopy Indications:              Screening for colorectal malignant neoplasm Providers:                Maylon Peppers, Charlsie Quest. Theda Sers RN, RN, Aram Candela Referring MD:              Medicines:                Monitored Anesthesia Care Complications:            No immediate complications. Estimated Blood Loss:     Estimated blood loss: none. Procedure:                Pre-Anesthesia Assessment:                           - Prior to the procedure, a History and Physical                            was performed, and patient medications, allergies                            and sensitivities were reviewed. The patient's                            tolerance of previous anesthesia was reviewed.                           - The risks and benefits of the procedure and the                            sedation options and risks were discussed with the                            patient. All questions were answered and informed                            consent was obtained.                           - ASA Grade Assessment: II - A patient with mild                            systemic disease.                           After obtaining informed consent, the colonoscope                            was passed under direct vision. Throughout the  procedure, the patient's blood pressure, pulse, and                            oxygen saturations were monitored continuously. The                            PCF-HQ190L ([id number]) scope was introduced through                            the anus and advanced to the the cecum, identified                            by appendiceal orifice and ileocecal valve. The                             colonoscopy was performed without difficulty. The                            patient tolerated the procedure well. The quality                            of the bowel preparation was adequate to identify                            polyps 6 mm and larger in size. Scope In: 7:43:59 AM Scope Out: 8:12:19 AM Scope Withdrawal Time: 0 hours 17 minutes 4 seconds  Total Procedure Duration: 0 hours 28 minutes 20 seconds  Findings:      The perianal and digital rectal examinations were normal.      Two sessile polyps were found in the transverse colon and ascending       colon. The polyps were 3 to 4 mm in size. These polyps were removed with       a cold snare. Resection and retrieval were complete.      A few small-mouthed diverticula were found in the sigmoid colon and       ascending colon.      Non-bleeding internal hemorrhoids were found during retroflexion. The       hemorrhoids were small. Impression:               - Two 3 to 4 mm polyps in the transverse colon and                            in the ascending colon, removed with a cold snare.                            Resected and retrieved.                           - Diverticulosis in the sigmoid colon and in the                            ascending colon.                           -  Non-bleeding internal hemorrhoids. Moderate Sedation:      Per Anesthesia Care Recommendation:           - Discharge patient to home (ambulatory).                           - Resume previous diet.                           - Await pathology results.                           - Repeat colonoscopy for surveillance based on                            pathology results.                           - Restart Xarelto tonight. Procedure Code(s):        --- Professional ---                           [phone], Colonoscopy, flexible; with removal of                            tumor(s), polyp(s), or other lesion(s) by snare                             technique Diagnosis Code(s):        --- Professional ---                           Z12.11, Encounter for screening for malignant                            neoplasm of colon                           K63.5, Polyp of colon                           K64.8, Other hemorrhoids                           K57.30, Diverticulosis of large intestine without                            perforation or abscess without bleeding CPT copyright 2019 [employer]. All rights reserved. The codes documented in this report are preliminary and upon coder review may  be revised to meet current compliance requirements. Maylon Peppers, MD Maylon Peppers,  05/21/2022 8:16:33 AM This report has been signed electronically. Number of Addenda: 0

## 2022-05-22 DIAGNOSIS — S233XXA Sprain of ligaments of thoracic spine, initial encounter: Secondary | ICD-10-CM | POA: Diagnosis not present

## 2022-05-22 DIAGNOSIS — M47812 Spondylosis without myelopathy or radiculopathy, cervical region: Secondary | ICD-10-CM | POA: Diagnosis not present

## 2022-05-22 DIAGNOSIS — M9903 Segmental and somatic dysfunction of lumbar region: Secondary | ICD-10-CM | POA: Diagnosis not present

## 2022-05-22 DIAGNOSIS — M9901 Segmental and somatic dysfunction of cervical region: Secondary | ICD-10-CM | POA: Diagnosis not present

## 2022-05-22 DIAGNOSIS — M47816 Spondylosis without myelopathy or radiculopathy, lumbar region: Secondary | ICD-10-CM | POA: Diagnosis not present

## 2022-05-22 DIAGNOSIS — M9902 Segmental and somatic dysfunction of thoracic region: Secondary | ICD-10-CM | POA: Diagnosis not present

## 2022-05-22 LAB — SURGICAL PATHOLOGY

## 2022-05-24 ENCOUNTER — Encounter (HOSPITAL_COMMUNITY): Payer: Self-pay | Admitting: Gastroenterology

## 2022-05-24 ENCOUNTER — Encounter (INDEPENDENT_AMBULATORY_CARE_PROVIDER_SITE_OTHER): Payer: Self-pay | Admitting: *Deleted

## 2022-05-28 DIAGNOSIS — S233XXA Sprain of ligaments of thoracic spine, initial encounter: Secondary | ICD-10-CM | POA: Diagnosis not present

## 2022-05-28 DIAGNOSIS — M9902 Segmental and somatic dysfunction of thoracic region: Secondary | ICD-10-CM | POA: Diagnosis not present

## 2022-05-28 DIAGNOSIS — M9903 Segmental and somatic dysfunction of lumbar region: Secondary | ICD-10-CM | POA: Diagnosis not present

## 2022-05-28 DIAGNOSIS — M47812 Spondylosis without myelopathy or radiculopathy, cervical region: Secondary | ICD-10-CM | POA: Diagnosis not present

## 2022-05-28 DIAGNOSIS — M9901 Segmental and somatic dysfunction of cervical region: Secondary | ICD-10-CM | POA: Diagnosis not present

## 2022-05-28 DIAGNOSIS — M47816 Spondylosis without myelopathy or radiculopathy, lumbar region: Secondary | ICD-10-CM | POA: Diagnosis not present

## 2022-05-29 IMAGING — CT NM PET TUM IMG INITIAL (PI) SKULL BASE T - THIGH
7 series · 25 of 25 positions shown · non-contrast
Comparison: None.

CLINICAL DATA: Initial treatment strategy for right breast cancer.

EXAM:
NUCLEAR MEDICINE PET SKULL BASE TO THIGH
TECHNIQUE: 10.9 mCi F-18 FDG was injected intravenously. Full-ring PET imaging
was performed from the skull base to thigh after the radiotracer. CT
data was obtained and used for attenuation correction and anatomic
localization.
Fasting blood glucose: 108 mg/dl

[Series 3: pet sk_thigh ac · axial · 5.0mm · 4.07mm/px · z∈[-1487,-527]mm · 6 of 241 slices shown]
[im 1/241]
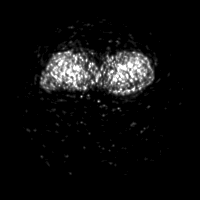
[im 49/241]
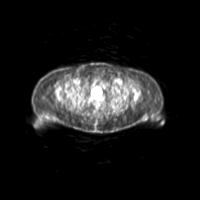
[im 97/241]
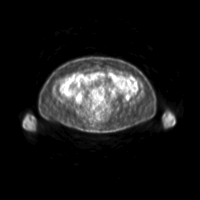
[im 145/241]
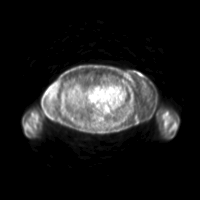
[im 193/241]
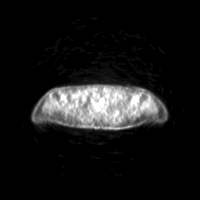
[im 241/241]
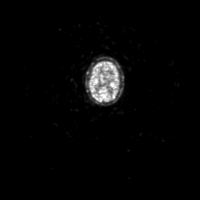

[Series 4: ct sk_thigh 5.0 hd_fov · axial · 5.0mm · 1.07mm/px · z∈[-1487,-527]mm · 5 of 241 slices shown]
[im 1/241]
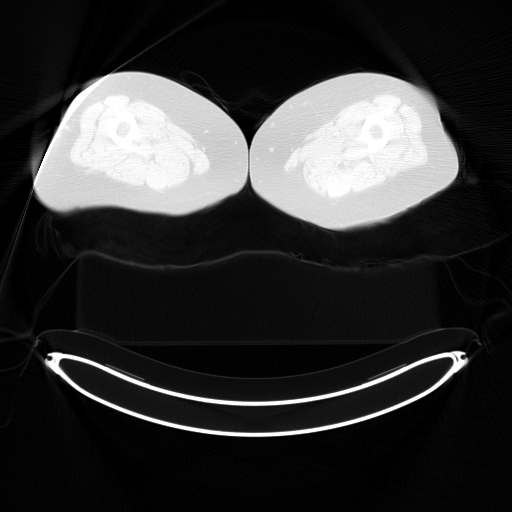
[im 61/241]
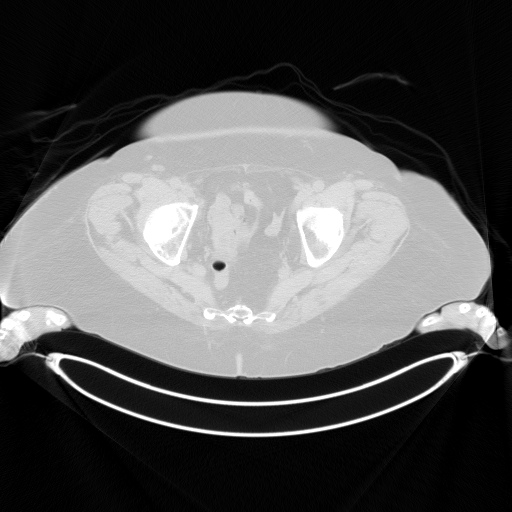
[im 121/241]
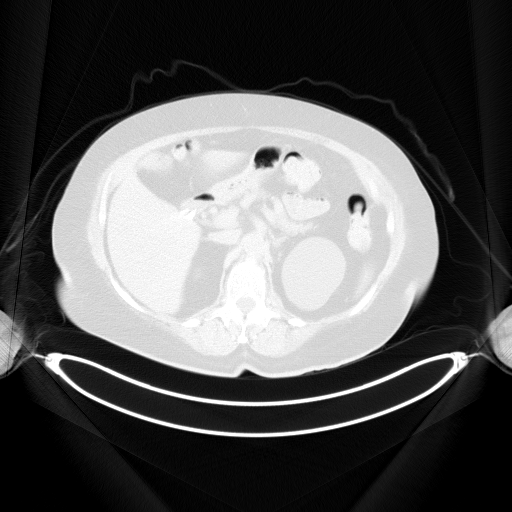
[im 181/241]
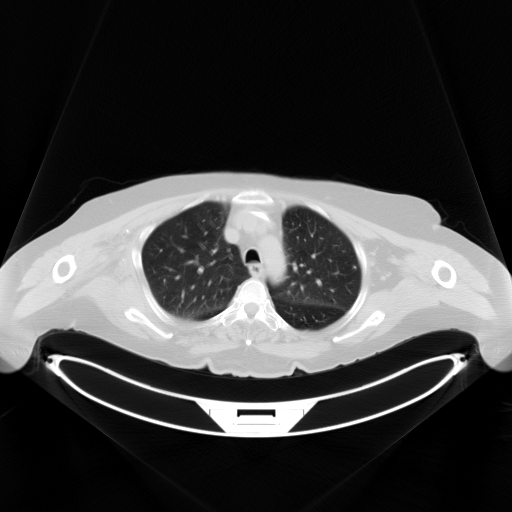
[im 241/241  brain]
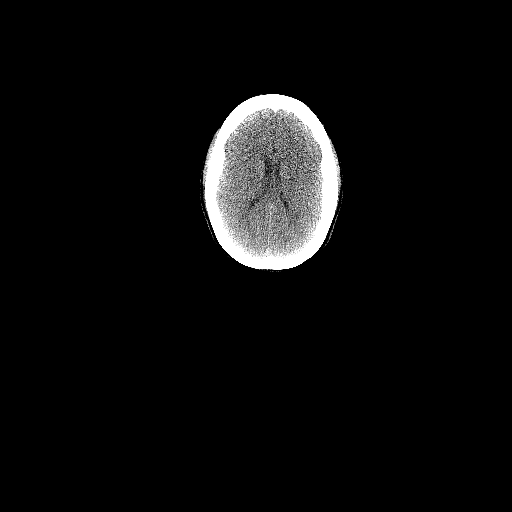

[Series 5: pet sk_thigh nac · axial · 5.0mm · 4.07mm/px · z∈[-1487,-527]mm · 5 of 241 slices shown]
[im 1/241]
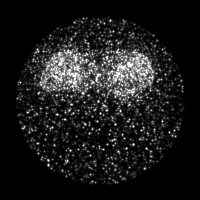
[im 61/241]
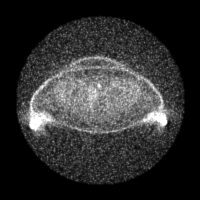
[im 121/241]
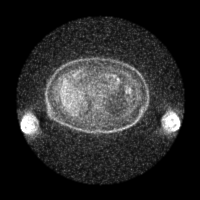
[im 181/241]
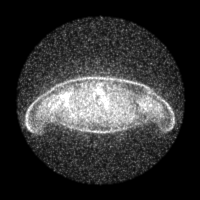
[im 241/241]
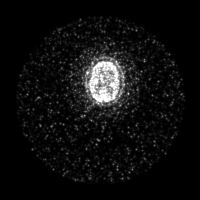

[Series 8: ct sk_thigh 5.0 br59 (id)_bone · axial · 5.0mm · 0.69mm/px · z∈[-975,-699]mm · 2 of 70 slices shown]
[im 1/70]
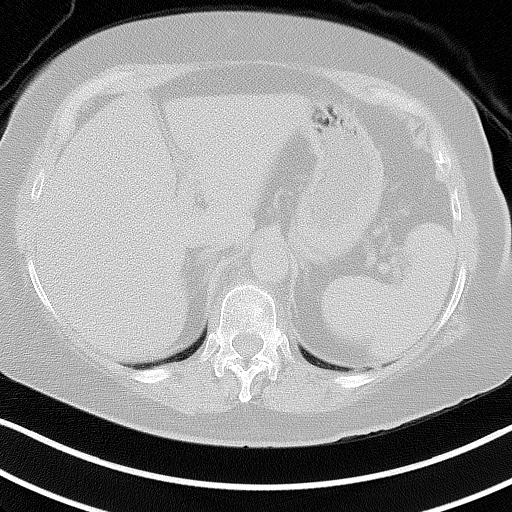
[im 70/70]
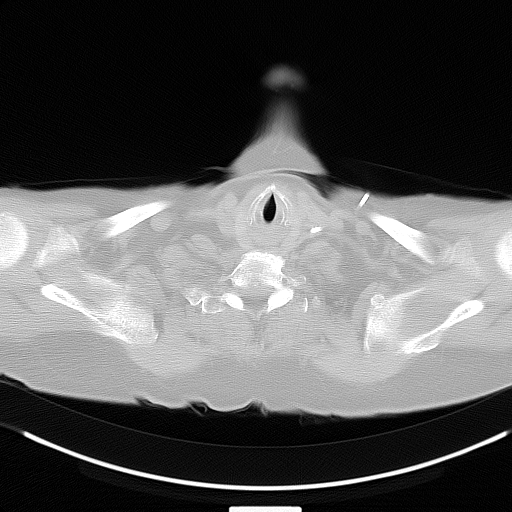

[Series 603: <mip collection> · coronal · 1.99mm/px · 1 of 32 slices shown]
[im 1/32]
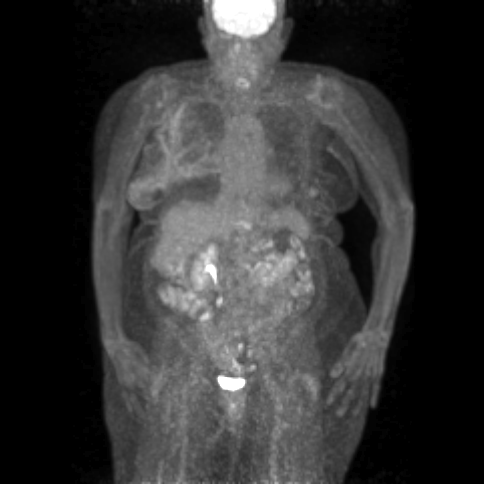

[Series 604: fused cor · 1 of 46 slices shown]
[im 1/46]
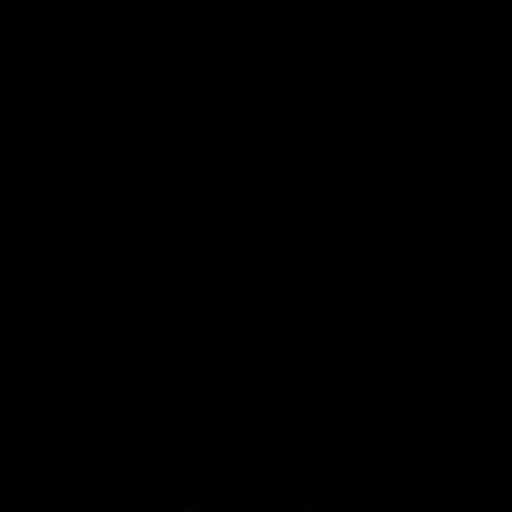

[Series 605: range-ct sk_thigh 5.0 hd_fov-tra-<alpha range> · 5 of 227 slices shown]
[im 1/227]
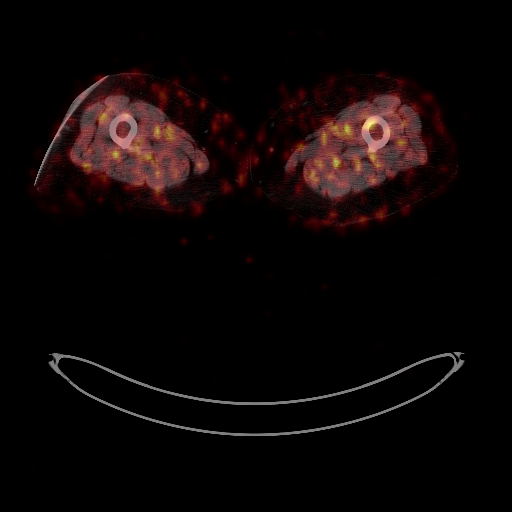
[im 57/227]
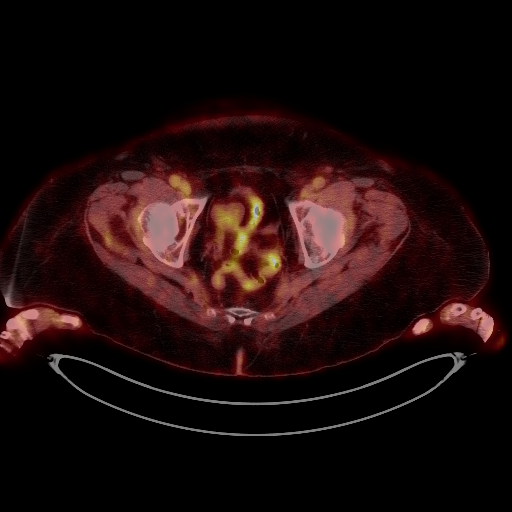
[im 114/227]
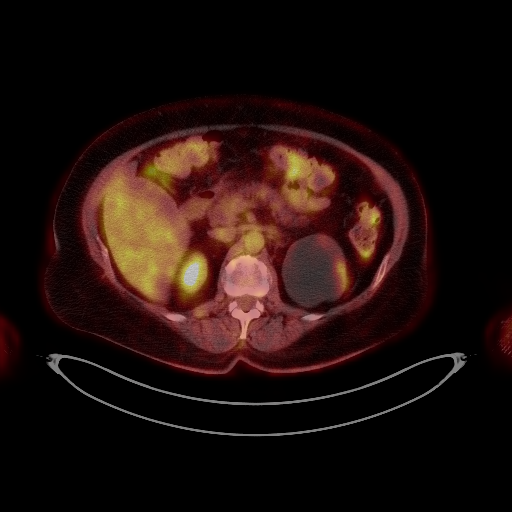
[im 170/227]
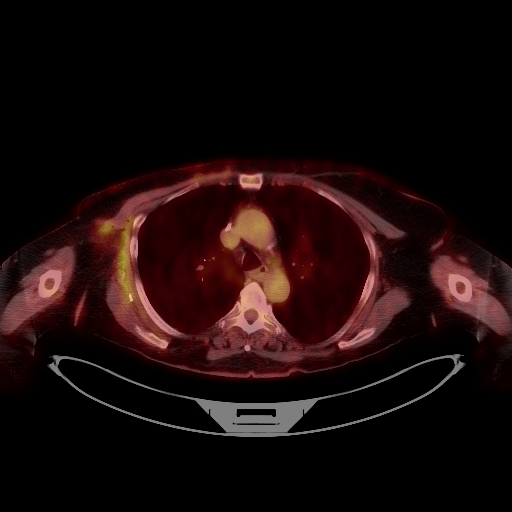
[im 227/227]
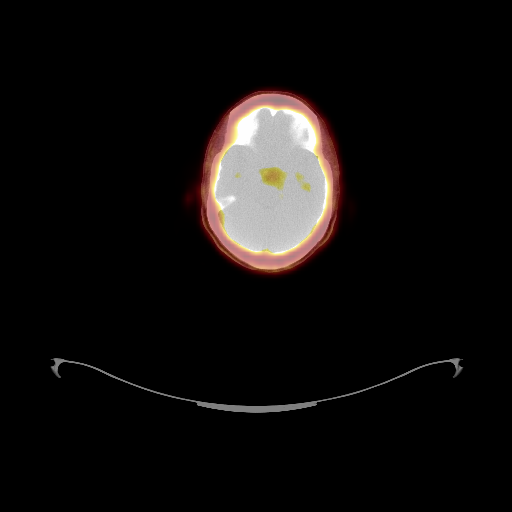

[25 of 25 positions shown; findings below may reference images not displayed]

FINDINGS: Mediastinal blood pool activity: SUV max

Liver activity: SUV max NA

NECK: Physiologic muscular activity in the neck. No pathologic or
hypermetabolic adenopathy. Glottic activity noted.

Incidental CT findings: none

CHEST: Expected postoperative activity along the right chest related
to recent surgery. Postoperative drain in place. Activity along the
right axillary dissection is most likely postoperative with maximum
SUV of 4.2.

No compelling findings of hypermetabolic malignancy in the chest.

Incidental CT findings: Left Port-A-Cath tip: SVC. Atherosclerotic
calcification of the aortic arch and branch vessels. Mild
cardiomegaly. Mild atelectasis in both lower lobes. Small benign
calcified pulmonary nodule in the left upper lobe on image 19 of
series 8. Small type 1 hiatal hernia.

ABDOMEN/PELVIS: Scattered speckled physiologic activity in bowel.
Photopenic left kidney upper pole cyst.

Incidental CT findings: Cholecystectomy.

SKELETON: Scalloped left eccentric superior endplate at T12 with
associated mildly accentuated metabolic activity, morphology of
activity suggests benign endplate compression fracture, maximum SUV
6.2.

Incidental CT findings: Graft harvest site from the left iliac bone.
Postoperative findings in the lower lumbar spine. Grade 1
anterolisthesis at L4-5.
IMPRESSION: 1. No compelling findings of active malignancy.
2. Scalloped left eccentric superior endplate of T12 with associated
metabolic activity along this portion of the endplate. The
appearance favors a subacute endplate compression fracture, with
osseous oligometastatic disease an unlikely possibility given the
morphology and appearance.
3. Postoperative findings along the right breast/chest.
4. Anterolisthesis at L4-5.  Postoperative findings at L5-S1.

## 2022-06-05 DIAGNOSIS — M9901 Segmental and somatic dysfunction of cervical region: Secondary | ICD-10-CM | POA: Diagnosis not present

## 2022-06-05 DIAGNOSIS — M47816 Spondylosis without myelopathy or radiculopathy, lumbar region: Secondary | ICD-10-CM | POA: Diagnosis not present

## 2022-06-05 DIAGNOSIS — M9902 Segmental and somatic dysfunction of thoracic region: Secondary | ICD-10-CM | POA: Diagnosis not present

## 2022-06-05 DIAGNOSIS — S233XXA Sprain of ligaments of thoracic spine, initial encounter: Secondary | ICD-10-CM | POA: Diagnosis not present

## 2022-06-05 DIAGNOSIS — M47812 Spondylosis without myelopathy or radiculopathy, cervical region: Secondary | ICD-10-CM | POA: Diagnosis not present

## 2022-06-05 DIAGNOSIS — M9903 Segmental and somatic dysfunction of lumbar region: Secondary | ICD-10-CM | POA: Diagnosis not present

## 2022-06-06 DIAGNOSIS — R030 Elevated blood-pressure reading, without diagnosis of hypertension: Secondary | ICD-10-CM | POA: Diagnosis not present

## 2022-06-06 DIAGNOSIS — Z516 Encounter for desensitization to allergens: Secondary | ICD-10-CM | POA: Diagnosis not present

## 2022-06-06 DIAGNOSIS — J45909 Unspecified asthma, uncomplicated: Secondary | ICD-10-CM | POA: Diagnosis not present

## 2022-06-06 DIAGNOSIS — J4540 Moderate persistent asthma, uncomplicated: Secondary | ICD-10-CM | POA: Diagnosis not present

## 2022-06-12 DIAGNOSIS — S233XXA Sprain of ligaments of thoracic spine, initial encounter: Secondary | ICD-10-CM | POA: Diagnosis not present

## 2022-06-12 DIAGNOSIS — M47812 Spondylosis without myelopathy or radiculopathy, cervical region: Secondary | ICD-10-CM | POA: Diagnosis not present

## 2022-06-12 DIAGNOSIS — M47816 Spondylosis without myelopathy or radiculopathy, lumbar region: Secondary | ICD-10-CM | POA: Diagnosis not present

## 2022-06-12 DIAGNOSIS — M9902 Segmental and somatic dysfunction of thoracic region: Secondary | ICD-10-CM | POA: Diagnosis not present

## 2022-06-12 DIAGNOSIS — M9901 Segmental and somatic dysfunction of cervical region: Secondary | ICD-10-CM | POA: Diagnosis not present

## 2022-06-12 DIAGNOSIS — M9903 Segmental and somatic dysfunction of lumbar region: Secondary | ICD-10-CM | POA: Diagnosis not present

## 2022-06-14 ENCOUNTER — Other Ambulatory Visit: Payer: Self-pay | Admitting: *Deleted

## 2022-06-14 NOTE — Patient Outreach (Signed)
  Care Coordination   06/14/2022  Name: Dawn Anderson MRN: [id number] DOB: 03-20-1946   Care Coordination Outreach Attempts:  An unsuccessful telephone outreach was attempted today to offer the patient information about available care coordination services as a benefit of their health plan. HIPAA compliant messages left on voicemail, providing contact information for CSW, encouraging patient to return CSW's call at her earliest convenience.  Follow Up Plan:  Additional outreach attempts will be made to offer the patient care coordination information and services.   Encounter Outcome:  No Answer.   Care Coordination Interventions Activated:  No.    Care Coordination Interventions:  No, not indicated.    Nat Christen, BSW, MSW, LCSW  Licensed Education officer, [facility]  Mailing [place] [address], [place], [place] [address] Physical Address[address], [place], [place] [address] Toll Free Main # [phone] Fax # [phone] Cell # [phone] [email or web address]

## 2022-06-18 ENCOUNTER — Other Ambulatory Visit: Payer: Self-pay | Admitting: *Deleted

## 2022-06-18 NOTE — Patient Outreach (Signed)
  Care Coordination   06/18/2022  Name: Dawn Anderson MRN: [id number] DOB: 11/27/1945   Care Coordination Outreach Attempts:  A second unsuccessful outreach was attempted today to offer the patient with information about available care coordination services as a benefit of their health plan.   HIPAA compliant messages left on voicemail, providing contact information for CSW, encouraging patient to return CSW's call at her earliest convenience.  Follow Up Plan:  Additional outreach attempts will be made to offer the patient care coordination information and services.   Encounter Outcome:  No Answer.   Care Coordination Interventions Activated:  No.    Care Coordination Interventions:  No, not indicated.    Nat Christen, BSW, MSW, LCSW  Licensed Education officer, [facility]  Mailing [place] [address], [place], [place] [address] Physical Address[address] [place], [place] [address] Toll Free Main # [phone] Fax # [phone] Cell # [phone] [email or web address]

## 2022-06-20 DIAGNOSIS — M47812 Spondylosis without myelopathy or radiculopathy, cervical region: Secondary | ICD-10-CM | POA: Diagnosis not present

## 2022-06-20 DIAGNOSIS — S233XXA Sprain of ligaments of thoracic spine, initial encounter: Secondary | ICD-10-CM | POA: Diagnosis not present

## 2022-06-20 DIAGNOSIS — M9903 Segmental and somatic dysfunction of lumbar region: Secondary | ICD-10-CM | POA: Diagnosis not present

## 2022-06-20 DIAGNOSIS — M9902 Segmental and somatic dysfunction of thoracic region: Secondary | ICD-10-CM | POA: Diagnosis not present

## 2022-06-20 DIAGNOSIS — M47816 Spondylosis without myelopathy or radiculopathy, lumbar region: Secondary | ICD-10-CM | POA: Diagnosis not present

## 2022-06-20 DIAGNOSIS — M9901 Segmental and somatic dysfunction of cervical region: Secondary | ICD-10-CM | POA: Diagnosis not present

## 2022-06-21 ENCOUNTER — Other Ambulatory Visit: Payer: Self-pay | Admitting: *Deleted

## 2022-06-21 DIAGNOSIS — Z7901 Long term (current) use of anticoagulants: Secondary | ICD-10-CM | POA: Diagnosis not present

## 2022-06-21 DIAGNOSIS — R040 Epistaxis: Secondary | ICD-10-CM | POA: Diagnosis not present

## 2022-06-21 DIAGNOSIS — I10 Essential (primary) hypertension: Secondary | ICD-10-CM | POA: Diagnosis not present

## 2022-06-21 DIAGNOSIS — I4891 Unspecified atrial fibrillation: Secondary | ICD-10-CM | POA: Diagnosis not present

## 2022-06-21 DIAGNOSIS — Z7984 Long term (current) use of oral hypoglycemic drugs: Secondary | ICD-10-CM | POA: Diagnosis not present

## 2022-06-21 DIAGNOSIS — Z79899 Other long term (current) drug therapy: Secondary | ICD-10-CM | POA: Diagnosis not present

## 2022-06-21 DIAGNOSIS — E119 Type 2 diabetes mellitus without complications: Secondary | ICD-10-CM | POA: Diagnosis not present

## 2022-06-21 NOTE — Patient Outreach (Signed)
  Care Coordination   06/21/2022  Name: Dawn Anderson MRN: [id number] DOB: 1945-09-17   Care Coordination Outreach Attempts:  A third unsuccessful outreach was attempted today to offer the patient with information about available care coordination services as a benefit of their health plan. HIPAA compliant messages left on voicemail, providing contact information for CSW, encouraging patient to return CSW's call at her earliest convenience.  Follow Up Plan:  No further outreach attempts will be made at this time. We have been unable to contact the patient to offer or enroll patient in care coordination services.  Encounter Outcome:  No Answer.   Care Coordination Interventions Activated:  No.    Care Coordination Interventions:  No, not indicated.    Nat Christen, BSW, MSW, LCSW  Licensed Education officer, [facility]  Mailing [place] [address] [place], [place] [address] Physical Address[address], [place],  [address] Toll Free Main # [phone] Fax # [phone] Cell # [phone] [email or web address]

## 2022-06-24 ENCOUNTER — Telehealth: Payer: Self-pay | Admitting: Cardiology

## 2022-06-24 NOTE — Telephone Encounter (Signed)
Returned call.  No answer. LM to call back.  11/21  Pt returned call.  Has not had any nose bleeds in a day,  Encourage use of saline nose spray.  She has appt with ENT in Dec.  Will discuss with him if nose bleeds become more frequent.  Informed if she get a very small tattoo it may bleed or bruise more that normal due to Xarelto but do not recommend stopping Xarelto to get a tatto.  She verbalized understanding.

## 2022-06-24 NOTE — Telephone Encounter (Signed)
Pt c/o medication issue:  1. Name of Medication:  rivaroxaban (XARELTO) 20 MG TABS tablet  2. How are you currently taking this medication (dosage and times per day)?  As prescribed, once daily   3. Are you having a reaction (difficulty breathing--STAT)?   4. What is your medication issue?   Patient states on Friday she had a massive nose bleed. + minor nose bleeds Saturday and Sunday. Patient states she has been been taking Xarelto and would like to know what Dr. Harl Bowie recommends. Patient also mentions she plans on getting  a small tattoo and would like to know if Xarelto will interfere. Please advise.

## 2022-06-25 MED ORDER — RIVAROXABAN 20 MG PO TABS: 20.0000 mg | ORAL_TABLET | Freq: Every day | ORAL | 5 refills | Status: DC

## 2022-07-04 DIAGNOSIS — Z23 Encounter for immunization: Secondary | ICD-10-CM | POA: Diagnosis not present

## 2022-07-04 DIAGNOSIS — M9901 Segmental and somatic dysfunction of cervical region: Secondary | ICD-10-CM | POA: Diagnosis not present

## 2022-07-04 DIAGNOSIS — S233XXA Sprain of ligaments of thoracic spine, initial encounter: Secondary | ICD-10-CM | POA: Diagnosis not present

## 2022-07-04 DIAGNOSIS — M47812 Spondylosis without myelopathy or radiculopathy, cervical region: Secondary | ICD-10-CM | POA: Diagnosis not present

## 2022-07-04 DIAGNOSIS — M9902 Segmental and somatic dysfunction of thoracic region: Secondary | ICD-10-CM | POA: Diagnosis not present

## 2022-07-04 DIAGNOSIS — M9903 Segmental and somatic dysfunction of lumbar region: Secondary | ICD-10-CM | POA: Diagnosis not present

## 2022-07-04 DIAGNOSIS — M47816 Spondylosis without myelopathy or radiculopathy, lumbar region: Secondary | ICD-10-CM | POA: Diagnosis not present

## 2022-07-09 DIAGNOSIS — E1129 Type 2 diabetes mellitus with other diabetic kidney complication: Secondary | ICD-10-CM | POA: Diagnosis not present

## 2022-07-09 DIAGNOSIS — I10 Essential (primary) hypertension: Secondary | ICD-10-CM | POA: Diagnosis not present

## 2022-07-09 DIAGNOSIS — E7849 Other hyperlipidemia: Secondary | ICD-10-CM | POA: Diagnosis not present

## 2022-07-09 DIAGNOSIS — E782 Mixed hyperlipidemia: Secondary | ICD-10-CM | POA: Diagnosis not present

## 2022-07-09 DIAGNOSIS — E1165 Type 2 diabetes mellitus with hyperglycemia: Secondary | ICD-10-CM | POA: Diagnosis not present

## 2022-07-11 DIAGNOSIS — F411 Generalized anxiety disorder: Secondary | ICD-10-CM | POA: Diagnosis not present

## 2022-07-11 DIAGNOSIS — E119 Type 2 diabetes mellitus without complications: Secondary | ICD-10-CM | POA: Diagnosis not present

## 2022-07-11 DIAGNOSIS — M47812 Spondylosis without myelopathy or radiculopathy, cervical region: Secondary | ICD-10-CM | POA: Diagnosis not present

## 2022-07-11 DIAGNOSIS — E7849 Other hyperlipidemia: Secondary | ICD-10-CM | POA: Diagnosis not present

## 2022-07-11 DIAGNOSIS — M9903 Segmental and somatic dysfunction of lumbar region: Secondary | ICD-10-CM | POA: Diagnosis not present

## 2022-07-11 DIAGNOSIS — Z1589 Genetic susceptibility to other disease: Secondary | ICD-10-CM | POA: Diagnosis not present

## 2022-07-11 DIAGNOSIS — C50919 Malignant neoplasm of unspecified site of unspecified female breast: Secondary | ICD-10-CM | POA: Diagnosis not present

## 2022-07-11 DIAGNOSIS — M9902 Segmental and somatic dysfunction of thoracic region: Secondary | ICD-10-CM | POA: Diagnosis not present

## 2022-07-11 DIAGNOSIS — S233XXA Sprain of ligaments of thoracic spine, initial encounter: Secondary | ICD-10-CM | POA: Diagnosis not present

## 2022-07-11 DIAGNOSIS — J45909 Unspecified asthma, uncomplicated: Secondary | ICD-10-CM | POA: Diagnosis not present

## 2022-07-11 DIAGNOSIS — I10 Essential (primary) hypertension: Secondary | ICD-10-CM | POA: Diagnosis not present

## 2022-07-11 DIAGNOSIS — J4542 Moderate persistent asthma with status asthmaticus: Secondary | ICD-10-CM | POA: Diagnosis not present

## 2022-07-11 DIAGNOSIS — Z9011 Acquired absence of right breast and nipple: Secondary | ICD-10-CM | POA: Diagnosis not present

## 2022-07-11 DIAGNOSIS — Z1502 Genetic susceptibility to malignant neoplasm of ovary: Secondary | ICD-10-CM | POA: Diagnosis not present

## 2022-07-11 DIAGNOSIS — M9901 Segmental and somatic dysfunction of cervical region: Secondary | ICD-10-CM | POA: Diagnosis not present

## 2022-07-11 DIAGNOSIS — K5900 Constipation, unspecified: Secondary | ICD-10-CM | POA: Diagnosis not present

## 2022-07-11 DIAGNOSIS — M47816 Spondylosis without myelopathy or radiculopathy, lumbar region: Secondary | ICD-10-CM | POA: Diagnosis not present

## 2022-07-11 DIAGNOSIS — J4550 Severe persistent asthma, uncomplicated: Secondary | ICD-10-CM | POA: Diagnosis not present

## 2022-07-11 DIAGNOSIS — Z09 Encounter for follow-up examination after completed treatment for conditions other than malignant neoplasm: Secondary | ICD-10-CM | POA: Diagnosis not present

## 2022-07-11 DIAGNOSIS — Z1509 Genetic susceptibility to other malignant neoplasm: Secondary | ICD-10-CM | POA: Diagnosis not present

## 2022-07-18 DIAGNOSIS — C50919 Malignant neoplasm of unspecified site of unspecified female breast: Secondary | ICD-10-CM | POA: Diagnosis not present

## 2022-07-18 DIAGNOSIS — Z1509 Genetic susceptibility to other malignant neoplasm: Secondary | ICD-10-CM | POA: Diagnosis not present

## 2022-07-18 DIAGNOSIS — Z9011 Acquired absence of right breast and nipple: Secondary | ICD-10-CM | POA: Diagnosis not present

## 2022-07-18 DIAGNOSIS — Z1502 Genetic susceptibility to malignant neoplasm of ovary: Secondary | ICD-10-CM | POA: Diagnosis not present

## 2022-07-18 DIAGNOSIS — Z1589 Genetic susceptibility to other disease: Secondary | ICD-10-CM | POA: Diagnosis not present

## 2022-07-18 DIAGNOSIS — Z9189 Other specified personal risk factors, not elsewhere classified: Secondary | ICD-10-CM | POA: Diagnosis not present

## 2022-07-22 DIAGNOSIS — M9901 Segmental and somatic dysfunction of cervical region: Secondary | ICD-10-CM | POA: Diagnosis not present

## 2022-07-22 DIAGNOSIS — M47812 Spondylosis without myelopathy or radiculopathy, cervical region: Secondary | ICD-10-CM | POA: Diagnosis not present

## 2022-07-22 DIAGNOSIS — S233XXA Sprain of ligaments of thoracic spine, initial encounter: Secondary | ICD-10-CM | POA: Diagnosis not present

## 2022-07-22 DIAGNOSIS — M9903 Segmental and somatic dysfunction of lumbar region: Secondary | ICD-10-CM | POA: Diagnosis not present

## 2022-07-22 DIAGNOSIS — M47816 Spondylosis without myelopathy or radiculopathy, lumbar region: Secondary | ICD-10-CM | POA: Diagnosis not present

## 2022-07-22 DIAGNOSIS — M9902 Segmental and somatic dysfunction of thoracic region: Secondary | ICD-10-CM | POA: Diagnosis not present

## 2022-07-23 DIAGNOSIS — H608X3 Other otitis externa, bilateral: Secondary | ICD-10-CM | POA: Diagnosis not present

## 2022-07-23 DIAGNOSIS — H903 Sensorineural hearing loss, bilateral: Secondary | ICD-10-CM | POA: Diagnosis not present

## 2022-07-23 DIAGNOSIS — R040 Epistaxis: Secondary | ICD-10-CM | POA: Diagnosis not present

## 2022-08-02 DIAGNOSIS — R030 Elevated blood-pressure reading, without diagnosis of hypertension: Secondary | ICD-10-CM | POA: Diagnosis not present

## 2022-08-02 DIAGNOSIS — M9902 Segmental and somatic dysfunction of thoracic region: Secondary | ICD-10-CM | POA: Diagnosis not present

## 2022-08-02 DIAGNOSIS — M9901 Segmental and somatic dysfunction of cervical region: Secondary | ICD-10-CM | POA: Diagnosis not present

## 2022-08-02 DIAGNOSIS — S233XXA Sprain of ligaments of thoracic spine, initial encounter: Secondary | ICD-10-CM | POA: Diagnosis not present

## 2022-08-02 DIAGNOSIS — D179 Benign lipomatous neoplasm, unspecified: Secondary | ICD-10-CM | POA: Diagnosis not present

## 2022-08-02 DIAGNOSIS — M47812 Spondylosis without myelopathy or radiculopathy, cervical region: Secondary | ICD-10-CM | POA: Diagnosis not present

## 2022-08-02 DIAGNOSIS — M9903 Segmental and somatic dysfunction of lumbar region: Secondary | ICD-10-CM | POA: Diagnosis not present

## 2022-08-02 DIAGNOSIS — M47816 Spondylosis without myelopathy or radiculopathy, lumbar region: Secondary | ICD-10-CM | POA: Diagnosis not present

## 2022-08-08 DIAGNOSIS — Z516 Encounter for desensitization to allergens: Secondary | ICD-10-CM | POA: Diagnosis not present

## 2022-08-08 DIAGNOSIS — L820 Inflamed seborrheic keratosis: Secondary | ICD-10-CM | POA: Diagnosis not present

## 2022-08-08 DIAGNOSIS — Z1283 Encounter for screening for malignant neoplasm of skin: Secondary | ICD-10-CM | POA: Diagnosis not present

## 2022-08-08 DIAGNOSIS — R030 Elevated blood-pressure reading, without diagnosis of hypertension: Secondary | ICD-10-CM | POA: Diagnosis not present

## 2022-08-08 DIAGNOSIS — J4540 Moderate persistent asthma, uncomplicated: Secondary | ICD-10-CM | POA: Diagnosis not present

## 2022-08-08 DIAGNOSIS — D225 Melanocytic nevi of trunk: Secondary | ICD-10-CM | POA: Diagnosis not present

## 2022-08-08 DIAGNOSIS — J45909 Unspecified asthma, uncomplicated: Secondary | ICD-10-CM | POA: Diagnosis not present

## 2022-08-12 DIAGNOSIS — C50911 Malignant neoplasm of unspecified site of right female breast: Secondary | ICD-10-CM | POA: Diagnosis not present

## 2022-08-13 DIAGNOSIS — R222 Localized swelling, mass and lump, trunk: Secondary | ICD-10-CM | POA: Diagnosis not present

## 2022-08-13 DIAGNOSIS — D171 Benign lipomatous neoplasm of skin and subcutaneous tissue of trunk: Secondary | ICD-10-CM | POA: Diagnosis not present

## 2022-08-14 DIAGNOSIS — C50911 Malignant neoplasm of unspecified site of right female breast: Secondary | ICD-10-CM | POA: Diagnosis not present

## 2022-08-15 DIAGNOSIS — M47812 Spondylosis without myelopathy or radiculopathy, cervical region: Secondary | ICD-10-CM | POA: Diagnosis not present

## 2022-08-15 DIAGNOSIS — M9901 Segmental and somatic dysfunction of cervical region: Secondary | ICD-10-CM | POA: Diagnosis not present

## 2022-08-15 DIAGNOSIS — S233XXA Sprain of ligaments of thoracic spine, initial encounter: Secondary | ICD-10-CM | POA: Diagnosis not present

## 2022-08-15 DIAGNOSIS — M9903 Segmental and somatic dysfunction of lumbar region: Secondary | ICD-10-CM | POA: Diagnosis not present

## 2022-08-15 DIAGNOSIS — M9902 Segmental and somatic dysfunction of thoracic region: Secondary | ICD-10-CM | POA: Diagnosis not present

## 2022-08-15 DIAGNOSIS — M47816 Spondylosis without myelopathy or radiculopathy, lumbar region: Secondary | ICD-10-CM | POA: Diagnosis not present

## 2022-08-21 DIAGNOSIS — G4733 Obstructive sleep apnea (adult) (pediatric): Secondary | ICD-10-CM | POA: Diagnosis not present

## 2022-08-21 DIAGNOSIS — J4550 Severe persistent asthma, uncomplicated: Secondary | ICD-10-CM | POA: Diagnosis not present

## 2022-08-29 DIAGNOSIS — Z1231 Encounter for screening mammogram for malignant neoplasm of breast: Secondary | ICD-10-CM | POA: Diagnosis not present

## 2022-08-29 DIAGNOSIS — M47816 Spondylosis without myelopathy or radiculopathy, lumbar region: Secondary | ICD-10-CM | POA: Diagnosis not present

## 2022-08-29 DIAGNOSIS — M9902 Segmental and somatic dysfunction of thoracic region: Secondary | ICD-10-CM | POA: Diagnosis not present

## 2022-08-29 DIAGNOSIS — S233XXA Sprain of ligaments of thoracic spine, initial encounter: Secondary | ICD-10-CM | POA: Diagnosis not present

## 2022-08-29 DIAGNOSIS — M47812 Spondylosis without myelopathy or radiculopathy, cervical region: Secondary | ICD-10-CM | POA: Diagnosis not present

## 2022-08-29 DIAGNOSIS — M9903 Segmental and somatic dysfunction of lumbar region: Secondary | ICD-10-CM | POA: Diagnosis not present

## 2022-08-29 DIAGNOSIS — M9901 Segmental and somatic dysfunction of cervical region: Secondary | ICD-10-CM | POA: Diagnosis not present

## 2022-09-05 DIAGNOSIS — J4542 Moderate persistent asthma with status asthmaticus: Secondary | ICD-10-CM | POA: Diagnosis not present

## 2022-09-05 DIAGNOSIS — J45909 Unspecified asthma, uncomplicated: Secondary | ICD-10-CM | POA: Diagnosis not present

## 2022-09-05 DIAGNOSIS — J4550 Severe persistent asthma, uncomplicated: Secondary | ICD-10-CM | POA: Diagnosis not present

## 2022-09-05 DIAGNOSIS — R030 Elevated blood-pressure reading, without diagnosis of hypertension: Secondary | ICD-10-CM | POA: Diagnosis not present

## 2022-09-05 DIAGNOSIS — Z516 Encounter for desensitization to allergens: Secondary | ICD-10-CM | POA: Diagnosis not present

## 2022-09-18 DIAGNOSIS — M9902 Segmental and somatic dysfunction of thoracic region: Secondary | ICD-10-CM | POA: Diagnosis not present

## 2022-09-18 DIAGNOSIS — M47816 Spondylosis without myelopathy or radiculopathy, lumbar region: Secondary | ICD-10-CM | POA: Diagnosis not present

## 2022-09-18 DIAGNOSIS — M9903 Segmental and somatic dysfunction of lumbar region: Secondary | ICD-10-CM | POA: Diagnosis not present

## 2022-09-18 DIAGNOSIS — M47812 Spondylosis without myelopathy or radiculopathy, cervical region: Secondary | ICD-10-CM | POA: Diagnosis not present

## 2022-09-18 DIAGNOSIS — M9901 Segmental and somatic dysfunction of cervical region: Secondary | ICD-10-CM | POA: Diagnosis not present

## 2022-09-18 DIAGNOSIS — S233XXA Sprain of ligaments of thoracic spine, initial encounter: Secondary | ICD-10-CM | POA: Diagnosis not present

## 2022-09-25 DIAGNOSIS — H0102A Squamous blepharitis right eye, upper and lower eyelids: Secondary | ICD-10-CM | POA: Diagnosis not present

## 2022-09-25 DIAGNOSIS — Z961 Presence of intraocular lens: Secondary | ICD-10-CM | POA: Diagnosis not present

## 2022-09-25 DIAGNOSIS — H0102B Squamous blepharitis left eye, upper and lower eyelids: Secondary | ICD-10-CM | POA: Diagnosis not present

## 2022-09-25 DIAGNOSIS — H02825 Cysts of left lower eyelid: Secondary | ICD-10-CM | POA: Diagnosis not present

## 2022-09-25 DIAGNOSIS — H1045 Other chronic allergic conjunctivitis: Secondary | ICD-10-CM | POA: Diagnosis not present

## 2022-09-25 DIAGNOSIS — E119 Type 2 diabetes mellitus without complications: Secondary | ICD-10-CM | POA: Diagnosis not present

## 2022-10-02 DIAGNOSIS — J4542 Moderate persistent asthma with status asthmaticus: Secondary | ICD-10-CM | POA: Diagnosis not present

## 2022-10-02 DIAGNOSIS — J4550 Severe persistent asthma, uncomplicated: Secondary | ICD-10-CM | POA: Diagnosis not present

## 2022-10-02 DIAGNOSIS — J45909 Unspecified asthma, uncomplicated: Secondary | ICD-10-CM | POA: Diagnosis not present

## 2022-10-02 DIAGNOSIS — Z516 Encounter for desensitization to allergens: Secondary | ICD-10-CM | POA: Diagnosis not present

## 2022-10-02 DIAGNOSIS — R030 Elevated blood-pressure reading, without diagnosis of hypertension: Secondary | ICD-10-CM | POA: Diagnosis not present

## 2022-10-02 DIAGNOSIS — Z6829 Body mass index (BMI) 29.0-29.9, adult: Secondary | ICD-10-CM | POA: Diagnosis not present

## 2022-10-23 ENCOUNTER — Inpatient Hospital Stay: Payer: Medicare PPO

## 2022-10-23 ENCOUNTER — Encounter: Payer: Self-pay | Admitting: Oncology

## 2022-10-23 ENCOUNTER — Inpatient Hospital Stay: Payer: Medicare PPO | Attending: Oncology | Admitting: Oncology

## 2022-10-23 VITALS — BP 127/88 | HR 94 | Temp 98.2°F | Resp 16 | Ht 67.3 in | Wt 199.0 lb

## 2022-10-23 DIAGNOSIS — Z1502 Genetic susceptibility to malignant neoplasm of ovary: Secondary | ICD-10-CM

## 2022-10-23 DIAGNOSIS — Z923 Personal history of irradiation: Secondary | ICD-10-CM

## 2022-10-23 DIAGNOSIS — I10 Essential (primary) hypertension: Secondary | ICD-10-CM

## 2022-10-23 DIAGNOSIS — C773 Secondary and unspecified malignant neoplasm of axilla and upper limb lymph nodes: Secondary | ICD-10-CM | POA: Insufficient documentation

## 2022-10-23 DIAGNOSIS — E119 Type 2 diabetes mellitus without complications: Secondary | ICD-10-CM

## 2022-10-23 DIAGNOSIS — Z79811 Long term (current) use of aromatase inhibitors: Secondary | ICD-10-CM | POA: Diagnosis not present

## 2022-10-23 DIAGNOSIS — M8580 Other specified disorders of bone density and structure, unspecified site: Secondary | ICD-10-CM

## 2022-10-23 DIAGNOSIS — Z170 Estrogen receptor positive status [ER+]: Secondary | ICD-10-CM | POA: Diagnosis not present

## 2022-10-23 DIAGNOSIS — T458X5D Adverse effect of other primarily systemic and hematological agents, subsequent encounter: Secondary | ICD-10-CM

## 2022-10-23 DIAGNOSIS — C50919 Malignant neoplasm of unspecified site of unspecified female breast: Secondary | ICD-10-CM

## 2022-10-23 DIAGNOSIS — Z9221 Personal history of antineoplastic chemotherapy: Secondary | ICD-10-CM

## 2022-10-23 DIAGNOSIS — C50911 Malignant neoplasm of unspecified site of right female breast: Secondary | ICD-10-CM | POA: Diagnosis not present

## 2022-10-23 DIAGNOSIS — T458X5A Adverse effect of other primarily systemic and hematological agents, initial encounter: Secondary | ICD-10-CM | POA: Insufficient documentation

## 2022-10-23 DIAGNOSIS — Z1589 Genetic susceptibility to other disease: Secondary | ICD-10-CM

## 2022-10-23 LAB — CBC WITH DIFFERENTIAL/PLATELET
Abs Immature Granulocytes: 0.02 10*3/uL (ref 0.00–0.07)
Basophils Absolute: 0.1 10*3/uL (ref 0.0–0.1)
Basophils Relative: 1 %
Eosinophils Absolute: 0.1 10*3/uL (ref 0.0–0.5)
Eosinophils Relative: 2 %
HCT: 45.1 % (ref 36.0–46.0)
Hemoglobin: 14.8 g/dL (ref 12.0–15.0)
Immature Granulocytes: 0 %
Lymphocytes Relative: 31 %
Lymphs Abs: 1.8 10*3/uL (ref 0.7–4.0)
MCH: 29.8 pg (ref 26.0–34.0)
MCHC: 32.8 g/dL (ref 30.0–36.0)
MCV: 90.7 fL (ref 80.0–100.0)
Monocytes Absolute: 0.5 10*3/uL (ref 0.1–1.0)
Monocytes Relative: 8 %
Neutro Abs: 3.3 10*3/uL (ref 1.7–7.7)
Neutrophils Relative %: 58 %
Platelets: 347 10*3/uL (ref 150–400)
RBC: 4.97 MIL/uL (ref 3.87–5.11)
RDW: 16 % — ABNORMAL HIGH (ref 11.5–15.5)
WBC: 5.7 10*3/uL (ref 4.0–10.5)
nRBC: 0 % (ref 0.0–0.2)

## 2022-10-23 LAB — COMPREHENSIVE METABOLIC PANEL
ALT: 27 U/L (ref 0–44)
AST: 27 U/L (ref 15–41)
Albumin: 4.1 g/dL (ref 3.5–5.0)
Alkaline Phosphatase: 75 U/L (ref 38–126)
Anion gap: 11 (ref 5–15)
BUN: 22 mg/dL (ref 8–23)
CO2: 25 mmol/L (ref 22–32)
Calcium: 9 mg/dL (ref 8.9–10.3)
Chloride: 102 mmol/L (ref 98–111)
Creatinine, Ser: 0.69 mg/dL (ref 0.44–1.00)
GFR, Estimated: >60 mL/min (ref >60)
Glucose, Bld: 104 mg/dL — ABNORMAL HIGH (ref 70–99)
Potassium: 3.4 mmol/L — ABNORMAL LOW (ref 3.5–5.1)
Sodium: 138 mmol/L (ref 135–145)
Total Bilirubin: 0.7 mg/dL (ref 0.3–1.2)
Total Protein: 7 g/dL (ref 6.5–8.1)

## 2022-10-23 LAB — VITAMIN D 25 HYDROXY (VIT D DEFICIENCY, FRACTURES): Vit D, 25-Hydroxy: 52.21 ng/mL (ref 30–100)

## 2022-10-23 LAB — PHOSPHORUS: Phosphorus: 3.2 mg/dL (ref 2.5–4.6)

## 2022-10-23 NOTE — Progress Notes (Signed)
[facility] Initial Visit:  Patient Care Team: Sasser, Silvestre Moment, MD as PCP - General (Family Medicine) Harl Bowie, Alphonse Guild, MD as PCP - Cardiology (Cardiology) Warren Danes, PA-C as Physician Assistant (Dermatology) Lavonna Monarch, MD (Inactive) as Consulting Physician (Dermatology)  CHIEF COMPLAINTS/PURPOSE OF CONSULTATION:  Oncology History  PALB2-related breast cancer [facility])  07/12/2020 Cancer Staging   Staging form: Breast, AJCC 8th Edition - Clinical stage from 07/12/2020: Stage IIB (cT2, cN2(sn), cM0, G3, ER+, PR+, HER2+) - Signed by Barbee Cough, MD on 10/29/2022 Histopathologic type: Adenocarcinoma, NOS Stage prefix: Initial diagnosis Method of lymph node assessment: Sentinel lymph node biopsy Histologic grading system: 3 grade system   02/07/2022 Initial Diagnosis   PALB2-related breast cancer (HCC)     HISTORY OF PRESENTING ILLNESS: Dawn Anderson 77 y.o. female is here because of  breast cancer  Medical history notable for asthma, hypertension, hyperglycemia, rosacea, paroxysmal atrial fibrillation, multiple food and environmental allergies, nasal sinus surgery, rotator cuff repair, partial hysterectomy, back surgery x2. Colonoscopy August 2016 3 small polyps removed. Hearing aids, DM Type II, vertigo. Patient diagnosed with breast cancer in November 2021 following mammography.  June 21 2020:  Right Breast Core Needle Biopsy and Node Biopsy   July 04 2020:  [facility] gene testing- No Pathogenic Variants Identified Variant of Uncertain Significance identified: PALB2 [id number] ([id number]) heterozygous  July 12, 2020: Right modified radical mastectomy, axillary node removal and PowerPort placement Pathology demonstrated a 3.2 cm, grade 3 invasive ductal carcinoma. Focal DCIS high-grade. Resection margins negative for carcinoma. Metastatic carcinoma to 4 of 17 intramamillary lymph nodes with the largest focus of metastatic carcinoma  measuring 1.5 cm. 9 lymph nodes in the right axillary tail negative for carcinoma. ER 90%/ PR 60%/ HER2 Positive (FISH)/  Ki-67 40% Pathologic stage:  Stage IIA (pT2, pN2a, cM0, G3, ER+, PR+, HER2+)   July 18, 2020: [facility] gene testing demonstrated variant of unknown significance in PALB 2 which was heterozygous.  July 24 2020: PET/CT No findings of active malignancy. Scalloped left eccentric superior endplate of O37 with associated metabolic activity along this portion of the endplate. Appearance favors a subacute endplate compression fracture, with osseous oligometastatic disease an unlikely possibility given the morphology and appearance. Postoperative findings along the right breast/chest. Anterolisthesis at L4-5.  Postoperative findings at L5-S1.  August 01, 2020: Pain in low thoracic region which began 2 to 3 days after surgery when she became more mobile. Pain interfering with sleep. Not helped by tylenol or hydrocodone. Incisions are healing well. Still has a drain in the axilla.  Cardiac ECHO. LV normal in size with upper normal wall thickness.  LVEF  65-70%. Grade I diastolic dysfunction (impaired relaxation). The aortic valve is trileaflet with mildly thickened leaflets with normal excursion. LA mildly to moderately dilated in size. RV normal in size, with normal systolic function. RA mildly dilated .  August 10 2020: Still with swelling in right axilla which is troubling her; very little pain there. Region is numb. Staples and drain removed two days ago. ECOG 2. Pain from compression fracture improved, mainly from Flexeril every 8 hours. Taking Oxycodone 5 mg, 2 to 3 tablets daily. Needs refill on Flexeril. Refered to [facility] for discussion of mastectomy site revision.  August 24 2020: Back pain OK only uses Oxycodone 5 mg at night. Still with pain and fullness, numbness in right mastectomy site. Decreased ROM right shoulder.  August 31 2020: [facility]  Consult. A completion mastectomy with complex tissue rearrangement recommended.  September 05 2019: Kyphoplasty consult. Since pain improving patient was in agreement not to proceed with kyphoplasty  September 06 2020: Pain from T12 fracture is abating. .  September 15 2020: Surgical revision of right mastectomy site at [facility]. Pathology from the surgery negative for carcinoma  September 27, 2020: Drain and sutures removed.  October 03, 2020: Back pain from compression fx resolved. Feels well enough to start treatment. Receive chemotherapy teaching.  Starting treatment next week to allow further wound healing and to recover from fatigue.   October 12 2020: Cycle 1 Taxotere/Carbo/ Herceptin  October 20 2020: Over the weekend developed severe diarrhea not helped by imodium; took two doses of 2 mg tablets today. Anorectic. Mouth feels funny but not sore. Received IVF earlier this week. Using ondansetron 8 mg up to tid. Wrote for Lomotil and somatostatin. To receive IVF. Erythema at incision site. Begin Doxycycline 100 mg bid  March 22 [id number]: Metallic taste improved by no longer eating foods using metal utensils.  Scalp is sore and burning and has small bumps. Rash and soreness central face and inside nose. Using [employer] skin products. (These are high end and may contain ingredients which are irritating skin). Diarrhea resolved. Eating and drinking well. Using bottled water.  October 31 2020: Fatigued in the afternoons but feels well overall. Still has some soreness in scalp. Rash in central face better. Developing alopecia.  November 02 2020: Cycle 2 Taxotere/Carbo/ Herceptin  November 20 2020: DEXA T-score of -1.5 with a 10-year positive probability of major osteoporotic fracture calculated at 24%   November 23 2020: Cycle 3 Taxotere/Carbo/ Herceptin  May 11 [id number]: Plagued by folliculitis on scalp helped by moisturizers and topical steroids prescribed by dermatologist. Dysgeusia  even to water; metallic taste. COVID booster after completing Taxotere/Carbo  Dec 14 2020: Cycle 4 Taxotere/Carbo/ Herceptin  January 04, 2021: Cycle 5 Taxotere/Carbo/ Herceptin  January 24, 2021: Cycle 6 Taxotere/Carbo/ Herceptin  February 14, 2021: Cycle 1 Herceptin  February 15 2021; Began Femara.  February 26, 2021: CT PA to evaluate shortness of breath demonstrated cardiomegaly but no evidence of PE. Bilateral lower extremity Dopplers negative for DVT  March 07, 2021: Cycle 2 Herceptin.  March 13 2021 through May 04 2021 Radiation:  Right Chest Wall/Regional Nodes (SCV/Axilla/IM) 5000 cGy in 200 cGy daily fractions followed by a mastectomy scar boost of 1000 cGy in 200 cGy daily fractions.  Developed Grade 3 radiation dematitis-resolved 1 week treatment break  March 15, 2021: Abdominal ultrasound to evaluate pain demonstrated fatty liver  March 28, 2021: Cycle 3 Herceptin  April 11, 2021: Cardiac echo LVEF 65 to 70%. Grade 1 diastolic dysfunction.  April 17, 2021: Erythematous chest wall rash due to XRT. Fatigued. ECOG 2. Has neuropathy of feet for which she is taking Lyrica 50 mg tid. Neuropathy bothers her more at night. No weight gain in the past month. Tolerating Femara well. No hot flashes. Interested in [place]. Has decreased ROM in right shoulder due to tightness.  April 18, 2021: Due for cycle 4 Herceptin  August 23 2021:  Left screening mammogram BIRADS 1  September 20 2021:  Cardiac ECHO LVEF 65 to 70% October 03 2021;  Completed Herceptin  September 02 2022:  Left screening Mammogram-BI-RADS CATEGORY  1: Negative.  February 12 2022:  Portacath removed  July 18 2022:  Placed on Alendronate 35 mg po weekly.   Declined Zometa due to h/o  exacerbation of asthma from infusion previously   October 23 2022:  [facility] Consult  Currently on Letrazole 2.5 mg daily.  Fatigued and has generalized arthralgias.  Depression and anxiety creeps in at night.   In March  2023 received Zometa with course complicated by fevers and chills and an exacerbation of asthma Patient is allergic to aspirin.  There are data to suggest there is a cross reactivity with bisphosphonates.   (Recommend prolia injections instead) States FSBG's were good until about a week ago when began indulging in sugar.     Review of Systems  Constitutional:  Negative for appetite change, chills, fatigue, fever and unexpected weight change.  HENT:   Negative for hearing loss, lump/mass, mouth sores, nosebleeds, sore throat, tinnitus, trouble swallowing and voice change.   Eyes:  Negative for eye problems and icterus.       Vision changes:  None  Respiratory:  Negative for chest tightness, cough, hemoptysis, shortness of breath and wheezing.        Using her CPAP regularly at night  Cardiovascular:  Negative for chest pain, leg swelling and palpitations.       PND:  none Orthopnea:  none  Gastrointestinal:  Negative for abdominal pain, blood in stool, constipation, diarrhea, nausea and vomiting.  Endocrine: Negative for hot flashes.       Cold intolerance:  none Heat intolerance:  none  Genitourinary:  Negative for bladder incontinence, difficulty urinating, dysuria, frequency, hematuria and nocturia.   Musculoskeletal:  Positive for arthralgias and myalgias. Negative for back pain, gait problem, neck pain and neck stiffness.  Skin:  Negative for itching, rash and wound.  Neurological:  Negative for dizziness, extremity weakness, gait problem, headaches, light-headedness, seizures and speech difficulty.       Has neuropathy of hands and feet from chemotherapy helped by acupuncture  Hematological:  Negative for adenopathy. Does not bruise/bleed easily.  Psychiatric/Behavioral:  Negative for sleep disturbance and suicidal ideas. The patient is not nervous/anxious.     MEDICAL HISTORY: Past Medical History:  Diagnosis Date   Arthritis    Asthma    Atypical mole 03/21/2006   moderate  atypia - left calf   Atypical mole 03/21/2006   moderate atypia - left pec   Basal cell carcinoma 03/13/1999   mid to upper back   Breast cancer ([place]) 07/12/2020   right   Diabetes mellitus without complication ([place])    Sleep apnea    8/4    SURGICAL HISTORY: Past Surgical History:  Procedure Laterality Date   ABDOMINAL HYSTERECTOMY     BACK SURGERY     x2   BREAST SURGERY Left    lump removed   CHOLECYSTECTOMY     COLONOSCOPY N/A 03/22/2015   Procedure: COLONOSCOPY;  Surgeon: Rogene Houston, MD;  [facility];  Service: Endoscopy;  Laterality: N/A;  830 - moved to 8/17 @ 7:30 - Ann to notify pt   COLONOSCOPY WITH PROPOFOL N/A 05/21/2022   Procedure: COLONOSCOPY WITH PROPOFOL;  Surgeon: Harvel Quale, MD;  Location: [facility];  Service: Gastroenterology;  Laterality: N/A;  730 ASA 3   MASTECTOMY Right 07/12/2020   09/2020   NASAL SINUS SURGERY     POLYPECTOMY  05/21/2022   Procedure: POLYPECTOMY;  Surgeon: Harvel Quale, MD;  Location: [facility];  Service: Gastroenterology;;   SHOULDER ARTHROSCOPY W/ ROTATOR CUFF REPAIR Right     SOCIAL HISTORY: Social History   Socioeconomic History  Marital status: Widowed    Spouse name: Not on file   Number of children: 2   Years of education: Not on file   Highest education level: Master's degree (e.g., MA, MS, MEng, MEd, MSW, MBA)  Occupational History    Comment: retired  Tobacco Use   Smoking status: Never   Smokeless tobacco: Never  Vaping Use   Vaping Use: Never used  Substance and Sexual Activity   Alcohol use: Not Currently    Comment: None since first being diagnosed with cancer   Drug use: No   Sexual activity: Not Currently    Birth control/protection: Surgical    Comment: hysterectomy  Other Topics Concern   Not on file  Social History Narrative   Lives alone   Caffeine- one soda   Social Determinants of Health   Financial Resource Strain: Low Risk  (04/10/2022)    Overall Financial Resource Strain (CARDIA)    Difficulty of Paying Living Expenses: Not hard at all  Food Insecurity: No Food Insecurity (04/10/2022)   Hunger Vital Sign    Worried About Running Out of Food in the Last Year: Never true    [place] in the Last Year: Never true  Transportation Needs: No Transportation Needs (04/10/2022)   PRAPARE - Hydrologist (Medical): No    Lack of Transportation (Non-Medical): No  Physical Activity: Sufficiently Active (04/10/2022)   Exercise Vital Sign    Days of Exercise per Week: 4 days    Minutes of Exercise per Session: 100 min  Stress: No Stress Concern Present (04/10/2022)   [place]    Feeling of Stress : Only a little  Social Connections: Moderately Isolated (04/10/2022)   Social Connection and Isolation Panel [NHANES]    Frequency of Communication with Friends and Family: More than three times a week    Frequency of Social Gatherings with Friends and Family: More than three times a week    Attends Religious Services: Never    Marine scientist or Organizations: Yes    Attends Music therapist: More than 4 times per year    Marital Status: Widowed  Intimate Partner Violence: Not At Risk (04/10/2022)   Humiliation, Afraid, Rape, and Kick questionnaire    Fear of Current or Ex-Partner: No    Emotionally Abused: No    Physically Abused: No    Sexually Abused: No    FAMILY HISTORY Family History  Problem Relation Age of Onset   Alzheimer's disease Mother    Kidney disease Father    Heart disease Father    Ehlers-Danlos syndrome Son     ALLERGIES:  is allergic to albuterol sulfate, apple juice, asa [aspirin], coconut (cocos nucifera), origanum oil, pineapple, plastibase, prunus persica, salicylates, salmeterol, strawberry (diagnostic), sulfamethizole, chocolate, clarithromycin, penicillins, latex, and  morphine.  MEDICATIONS:  Current Outpatient Medications  Medication Sig Dispense Refill   ACCU-CHEK GUIDE test strip USE TO TEST ONCE DAILY     acetaminophen (TYLENOL) 500 MG tablet Take 1,000 mg by mouth every 8 (eight) hours as needed for moderate pain.     Ascorbic Acid (VITAMIN C) 1000 MG tablet Take 1,000 mg by mouth daily.     B Complex Vitamins (VITAMIN-B COMPLEX PO) Take 1 tablet by mouth daily.     cholecalciferol (VITAMIN D3) 25 MCG (1000 UNIT) tablet Take 1,000 Units by mouth daily.     Coenzyme Q10 100 MG  capsule Take 100 mg by mouth daily.     EPINEPHrine 0.3 mg/0.3 mL IJ SOAJ injection Inject 0.3 mg into the muscle as needed for anaphylaxis.     fluticasone (FLONASE) 50 MCG/ACT nasal spray Place 1 spray into both nostrils in the morning and at bedtime.     letrozole (FEMARA) 2.5 MG tablet Take 2.5 mg by mouth daily.     loratadine (CLARITIN) 10 MG tablet Take 10 mg by mouth daily.     LORazepam (ATIVAN) 1 MG tablet Take 1 mg by mouth every 8 (eight) hours as needed for anxiety.     losartan-hydrochlorothiazide (HYZAAR) 50-12.5 MG tablet Take 1 tablet by mouth every morning.  2   Melatonin 10 MG CAPS Take 10 mg by mouth at bedtime.     metFORMIN (GLUCOPHAGE-XR) 500 MG 24 hr tablet Take 500 mg by mouth 2 (two) times daily.     Milk Thistle 175 MG CAPS Take 175 mg by mouth daily.     montelukast (SINGULAIR) 10 MG tablet Take 10 mg by mouth at bedtime.     MOUNJARO 7.5 MG/0.5ML Pen Inject 7.5 mg into the skin every Wednesday.     Multiple Vitamin (MULTIVITAMIN) tablet Take 1 tablet by mouth daily.     NON FORMULARY CPAP MACHINE     nystatin-triamcinolone (MYCOLOG II) cream Apply 1 application topically 2 (two) times daily. Apply thin layer bid prn (Patient taking differently: Apply 1 application  topically 2 (two) times daily as needed (irritation).) 30 g 3   rivaroxaban (XARELTO) 20 MG TABS tablet Take 1 tablet (20 mg total) by mouth daily with supper. 30 tablet 5    Spacer/Aero-Holding Chambers (VORTEX VALVED HOLDING CHAMBER) DEVI USE with mdi     SYMBICORT 160-4.5 MCG/ACT inhaler Inhale 2 puffs into the lungs 2 (two) times daily.     XOLAIR 150 MG/ML prefilled syringe Inject 300 mg into the skin every 28 (twenty-eight) days.     XOPENEX HFA 45 MCG/ACT inhaler Inhale 2 puffs into the lungs every 6 (six) hours as needed for shortness of breath or wheezing.     No current facility-administered medications for this visit.    PHYSICAL EXAMINATION:  ECOG PERFORMANCE STATUS: 0 - Asymptomatic   Vitals:   10/23/22 1122  BP: 127/88  Pulse: 94  Resp: 16  Temp: 98.2 F (36.8 C)  SpO2: 96%    Filed Weights   10/23/22 1122  Weight: 199 lb (90.3 kg)     Physical Exam Vitals and nursing note reviewed. Exam conducted with a chaperone present.  Constitutional:      General: She is not in acute distress.    Appearance: She is not toxic-appearing or diaphoretic.     Comments: Here alone.  Looks well  HENT:     Head: Normocephalic and atraumatic.     Right Ear: External ear normal.     Left Ear: External ear normal.     Nose: Nose normal. No congestion or rhinorrhea.  Eyes:     General: No scleral icterus.    Pupils: Pupils are equal, round, and reactive to light.  Cardiovascular:     Rate and Rhythm: Normal rate and regular rhythm.     Heart sounds: Normal heart sounds. No murmur heard.    No friction rub. No gallop.  Pulmonary:     Effort: Pulmonary effort is normal. No respiratory distress.     Breath sounds: Normal breath sounds. No wheezing or rales.  Chest:  Breasts:  Right: Normal. No swelling, bleeding, inverted nipple, mass, nipple discharge, skin change or tenderness.     Left: Absent.  Abdominal:     General: There is no distension.     Palpations: Abdomen is soft.     Tenderness: There is no abdominal tenderness. There is no right CVA tenderness, left CVA tenderness or guarding.  Musculoskeletal:        General: No swelling,  tenderness, deformity or signs of injury.     Cervical back: Normal range of motion and neck supple. No rigidity or tenderness.     Right lower leg: No edema.     Left lower leg: No edema.  Lymphadenopathy:     Head:     Right side of head: No submental, submandibular, tonsillar, preauricular, posterior auricular or occipital adenopathy.     Left side of head: No submental, submandibular, tonsillar, preauricular, posterior auricular or occipital adenopathy.     Cervical: No cervical adenopathy.     Right cervical: No superficial, deep or posterior cervical adenopathy.    Left cervical: No superficial, deep or posterior cervical adenopathy.     Upper Body:     Right upper body: No supraclavicular, axillary or pectoral adenopathy.     Left upper body: No supraclavicular, axillary or pectoral adenopathy.     Lower Body: No right inguinal adenopathy. No left inguinal adenopathy.  Skin:    General: Skin is warm.     Coloration: Skin is not jaundiced or pale.     Findings: No erythema.  Neurological:     General: No focal deficit present.     Mental Status: She is alert and oriented to person, place, and time.     Cranial Nerves: No cranial nerve deficit.     Motor: No weakness.  Psychiatric:        Mood and Affect: Mood normal.        Behavior: Behavior normal.        Thought Content: Thought content normal.        Judgment: Judgment normal.      LABORATORY DATA: I have personally reviewed the data as listed:  Appointment on 10/23/2022  Component Date Value Ref Range Status   Phosphorus 10/23/2022 3.2  2.5 - 4.6 mg/dL Final   Performed at [facility], [place] [address], [place], [place] [address]   Vit D, 25-Hydroxy 10/23/2022 52.21  30 - 100 ng/mL Final   Comment: (NOTE) Vitamin D deficiency has been defined by the [place] practice guideline as a level of serum 25-OH  vitamin D less than 20 ng/mL (1,2). The Endocrine  Society went on to  further define vitamin D insufficiency as a level between 21 and 29  ng/mL (2).  1. IOM (Institute of Medicine). 2010. Dietary reference intakes for  calcium and D. [place]: The [employer]. 2. Holick MF, Binkley Tenino, Bischoff-Ferrari HA, et al. Evaluation,  treatment, and prevention of vitamin D deficiency: an Endocrine  Society clinical practice guideline, JCEM. 2011 Jul; 96(7): 1911-30.  Performed at [facility], [place] [address] [place], [place] [address]   Office Visit on 10/23/2022  Component Date Value Ref Range Status   WBC 10/23/2022 5.7  4.0 - 10.5 K/uL Final   RBC 10/23/2022 4.97  3.87 - 5.11 MIL/uL Final   Hemoglobin 10/23/2022 14.8  12.0 - 15.0 g/dL Final   HCT 10/23/2022 45.1  36.0 - 46.0 % Final  MCV 10/23/2022 90.7  80.0 - 100.0 fL Final   MCH 10/23/2022 29.8  26.0 - 34.0 pg Final   MCHC 10/23/2022 32.8  30.0 - 36.0 g/dL Final   RDW 10/23/2022 16.0 (H)  11.5 - 15.5 % Final   Platelets 10/23/2022 347  150 - 400 K/uL Final   nRBC 10/23/2022 0.0  0.0 - 0.2 % Final   Neutrophils Relative % 10/23/2022 58  % Final   Neutro Abs 10/23/2022 3.3  1.7 - 7.7 K/uL Final   Lymphocytes Relative 10/23/2022 31  % Final   Lymphs Abs 10/23/2022 1.8  0.7 - 4.0 K/uL Final   Monocytes Relative 10/23/2022 8  % Final   Monocytes Absolute 10/23/2022 0.5  0.1 - 1.0 K/uL Final   Eosinophils Relative 10/23/2022 2  % Final   Eosinophils Absolute 10/23/2022 0.1  0.0 - 0.5 K/uL Final   Basophils Relative 10/23/2022 1  % Final   Basophils Absolute 10/23/2022 0.1  0.0 - 0.1 K/uL Final   Immature Granulocytes 10/23/2022 0  % Final   Abs Immature Granulocytes 10/23/2022 0.02  0.00 - 0.07 K/uL Final   Performed at [facility], [place] [address], [place], [place] [address]   Sodium 10/23/2022 138  135 - 145 mmol/L Final   Potassium 10/23/2022 3.4 (L)  3.5 - 5.1 mmol/L Final   Chloride 10/23/2022 102  98 - 111 mmol/L Final   CO2  10/23/2022 25  22 - 32 mmol/L Final   Glucose, Bld 10/23/2022 104 (H)  70 - 99 mg/dL Final   Glucose reference range applies only to samples taken after fasting for at least 8 hours.   BUN 10/23/2022 22  8 - 23 mg/dL Final   Creatinine, Ser 10/23/2022 0.69  0.44 - 1.00 mg/dL Final   Calcium 10/23/2022 9.0  8.9 - 10.3 mg/dL Final   Total Protein 10/23/2022 7.0  6.5 - 8.1 g/dL Final   Albumin 10/23/2022 4.1  3.5 - 5.0 g/dL Final   AST 10/23/2022 27  15 - 41 U/L Final   ALT 10/23/2022 27  0 - 44 U/L Final   Alkaline Phosphatase 10/23/2022 75  38 - 126 U/L Final   Total Bilirubin 10/23/2022 0.7  0.3 - 1.2 mg/dL Final   GFR, Estimated 10/23/2022 >60  >60 mL/min Final   Comment: (NOTE) Calculated using the CKD-EPI Creatinine Equation (2021)    Anion gap 10/23/2022 11  5 - 15 Final   Performed at [facility], [place] [address], [place], [place] [address]    RADIOGRAPHIC STUDIES: I have personally reviewed the radiological images as listed and agree with the findings in the report  No results found.  ASSESSMENT/PLAN  77 y.o. female with history of asthma, hypertension, hyperglycemia, rosacea, paroxysmal atrial fibrillation, multiple food and environmental allergies, nasal sinus surgery, rotator cuff repair, partial hysterectomy, back surgery x2. Colonoscopy August 2016 3 small polyps removed. Hearing aids, DM Type II, vertigo. Patient diagnosed with breast cancer in November 2021 following mammography.   Invasive ductal carcinoma Right breast, Stage IB (T2 N1 M0) ER 90%/PR 60%/HER-2/neu positive/Ki-67 40%:  June 21 2020 Core needle bx after found to have an abnormal mammogram.  July 12, 2020: Right modified radical mastectomy, axillary node removal and PowerPort placement July 24 2020: PET/CT No findings of active malignancy. Compression fracture superior endplate of 624THL August 01 2020- Cardiac ECHO LVEF 65-70% September 15 2020: Surgical revision of right  mastectomy site at [facility]. Pathology from the  surgery negative for carcinoma    October 12 2020- January 24 2021-  Cycle 1 through Cycle 6  Docetaxel/ Carbo (AUC 5)/ Trastuzumab. Cr clearance overestimated by labs at 95 due to DM Type II; used 85 which would be more reasonable given age, obesity and decreased muscle mass.  Tolerated chemotherapy with expected side effects of alopecia, racial rash February 14 2021- October 03 2021- Received q3 week Herceptin February 15 2021- Began Femara September 02 2022- Left screening mammogram BIRADS 02 February 2026- To complete femara   IV access: Underwent port placement at time of original mastectomy.  Removed on February 12 2022    T12 compression fracture:  July 24 2020- Presented with back pain.  Abnormality noted on PET/CT.  Managed conservatively.   September 04 2020- Referred for consideration of kyphoplasty but since pain was improving this was held   Genetic testing:  July 04 2020- [facility] testing performed because of breast cancer diagnosis and history of colon polyps. Demonstrated variant of unknown significance in PALB 2 which was heterozygous.   Osteoporosis:  Risk factors are age, gender, post menopausal status, chemo history and use of an AI  December 2021- T12 compression fracture  March 2023- Treatment attempted with Zometa but developed asthma exacerbation  July 18 2022- Alendronate recommended  March 2024-  Recommend Prolia injections instead of bisphosphonates.  Patient has allergy to aspirin and there is cross reactivity between ASA and bisphosphonates.  Patient in agreement.  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    Orders Placed This Encounter  Procedures   CBC with Differential/Platelet   Comprehensive metabolic panel   VITAMIN D 25 Hydroxy (Vit-D Deficiency, Fractures)    Standing Status:   Future    Number of Occurrences:   1    Standing Expiration Date:   10/23/2023   Phosphorus    Standing Status:   Future    Number of Occurrences:   1    Standing Expiration Date:   10/23/2023    82  minutes was spent in patient care.  This included time spent preparing to see the patient (e.g., review of tests), obtaining and/or reviewing separately obtained history, counseling and educating the patient/family/caregiver, ordering medications, tests, or procedures; documenting clinical information in the electronic or other health record, independently interpreting results and communicating results to the patient/family/caregiver as well as coordination of care.       All questions were answered. The patient knows to call the clinic with any problems, questions or concerns.  This note was electronically signed.    Barbee Cough, MD  10/29/2022 12:05 PM

## 2022-10-24 ENCOUNTER — Telehealth: Payer: Self-pay | Admitting: Oncology

## 2022-10-24 NOTE — Telephone Encounter (Signed)
Called patient regarding upcoming appointments, left a voicemail. 

## 2022-10-25 ENCOUNTER — Telehealth: Payer: Self-pay | Admitting: Oncology

## 2022-10-25 NOTE — Telephone Encounter (Signed)
Called patient regarding September appointments, patient is notified.

## 2022-10-29 ENCOUNTER — Encounter: Payer: Self-pay | Admitting: Oncology

## 2022-10-29 DIAGNOSIS — Z9221 Personal history of antineoplastic chemotherapy: Secondary | ICD-10-CM | POA: Insufficient documentation

## 2022-10-29 DIAGNOSIS — Z923 Personal history of irradiation: Secondary | ICD-10-CM | POA: Insufficient documentation

## 2022-10-30 DIAGNOSIS — Z516 Encounter for desensitization to allergens: Secondary | ICD-10-CM | POA: Diagnosis not present

## 2022-10-30 DIAGNOSIS — R030 Elevated blood-pressure reading, without diagnosis of hypertension: Secondary | ICD-10-CM | POA: Diagnosis not present

## 2022-10-30 DIAGNOSIS — J4550 Severe persistent asthma, uncomplicated: Secondary | ICD-10-CM | POA: Diagnosis not present

## 2022-10-30 DIAGNOSIS — J4542 Moderate persistent asthma with status asthmaticus: Secondary | ICD-10-CM | POA: Diagnosis not present

## 2022-10-30 DIAGNOSIS — J45909 Unspecified asthma, uncomplicated: Secondary | ICD-10-CM | POA: Diagnosis not present

## 2022-11-12 DIAGNOSIS — R7303 Prediabetes: Secondary | ICD-10-CM | POA: Diagnosis not present

## 2022-11-12 DIAGNOSIS — E7849 Other hyperlipidemia: Secondary | ICD-10-CM | POA: Diagnosis not present

## 2022-11-12 DIAGNOSIS — I10 Essential (primary) hypertension: Secondary | ICD-10-CM | POA: Diagnosis not present

## 2022-11-12 DIAGNOSIS — E119 Type 2 diabetes mellitus without complications: Secondary | ICD-10-CM | POA: Diagnosis not present

## 2022-11-12 DIAGNOSIS — R5383 Other fatigue: Secondary | ICD-10-CM | POA: Diagnosis not present

## 2022-11-19 DIAGNOSIS — H02825 Cysts of left lower eyelid: Secondary | ICD-10-CM | POA: Diagnosis not present

## 2022-11-20 DIAGNOSIS — I10 Essential (primary) hypertension: Secondary | ICD-10-CM | POA: Diagnosis not present

## 2022-11-20 DIAGNOSIS — F411 Generalized anxiety disorder: Secondary | ICD-10-CM | POA: Diagnosis not present

## 2022-11-20 DIAGNOSIS — K5900 Constipation, unspecified: Secondary | ICD-10-CM | POA: Diagnosis not present

## 2022-11-20 DIAGNOSIS — Z516 Encounter for desensitization to allergens: Secondary | ICD-10-CM | POA: Diagnosis not present

## 2022-11-20 DIAGNOSIS — C50919 Malignant neoplasm of unspecified site of unspecified female breast: Secondary | ICD-10-CM | POA: Diagnosis not present

## 2022-11-20 DIAGNOSIS — J45909 Unspecified asthma, uncomplicated: Secondary | ICD-10-CM | POA: Diagnosis not present

## 2022-11-20 DIAGNOSIS — E119 Type 2 diabetes mellitus without complications: Secondary | ICD-10-CM | POA: Diagnosis not present

## 2022-11-20 DIAGNOSIS — J4542 Moderate persistent asthma with status asthmaticus: Secondary | ICD-10-CM | POA: Diagnosis not present

## 2022-11-20 DIAGNOSIS — E7849 Other hyperlipidemia: Secondary | ICD-10-CM | POA: Diagnosis not present

## 2022-11-21 DIAGNOSIS — H608X3 Other otitis externa, bilateral: Secondary | ICD-10-CM | POA: Diagnosis not present

## 2022-11-21 DIAGNOSIS — H9041 Sensorineural hearing loss, unilateral, right ear, with unrestricted hearing on the contralateral side: Secondary | ICD-10-CM | POA: Diagnosis not present

## 2022-11-21 DIAGNOSIS — H6121 Impacted cerumen, right ear: Secondary | ICD-10-CM | POA: Diagnosis not present

## 2022-11-21 DIAGNOSIS — R040 Epistaxis: Secondary | ICD-10-CM | POA: Diagnosis not present

## 2022-11-21 DIAGNOSIS — H9311 Tinnitus, right ear: Secondary | ICD-10-CM | POA: Diagnosis not present

## 2022-11-28 DIAGNOSIS — Z516 Encounter for desensitization to allergens: Secondary | ICD-10-CM | POA: Diagnosis not present

## 2022-11-28 DIAGNOSIS — J4542 Moderate persistent asthma with status asthmaticus: Secondary | ICD-10-CM | POA: Diagnosis not present

## 2022-11-28 DIAGNOSIS — J45909 Unspecified asthma, uncomplicated: Secondary | ICD-10-CM | POA: Diagnosis not present

## 2022-11-28 DIAGNOSIS — R030 Elevated blood-pressure reading, without diagnosis of hypertension: Secondary | ICD-10-CM | POA: Diagnosis not present

## 2022-11-28 DIAGNOSIS — M8589 Other specified disorders of bone density and structure, multiple sites: Secondary | ICD-10-CM | POA: Diagnosis not present

## 2022-11-28 DIAGNOSIS — J4550 Severe persistent asthma, uncomplicated: Secondary | ICD-10-CM | POA: Diagnosis not present

## 2022-12-04 DIAGNOSIS — G4733 Obstructive sleep apnea (adult) (pediatric): Secondary | ICD-10-CM | POA: Diagnosis not present

## 2022-12-09 ENCOUNTER — Other Ambulatory Visit: Payer: Self-pay | Admitting: Otolaryngology

## 2022-12-09 DIAGNOSIS — H918X3 Other specified hearing loss, bilateral: Secondary | ICD-10-CM

## 2022-12-10 ENCOUNTER — Telehealth: Payer: Self-pay

## 2022-12-10 NOTE — Telephone Encounter (Signed)
-----   Message from Loni Muse, MD sent at 12/10/2022  9:27 AM EDT ----- I think his rational is snake oil.  Would love to see the paper supporting his theory.  He may use gentle soft tissue techniques on her.    ----- Message ----- From: Arville Care, CMA Sent: 12/09/2022   2:33 PM EDT To: Loni Muse, MD  Patient called stating that her Chiropractor  wanted her to run this by you, but he does a stem wave procedure to treat dormant cells in the spine. She has a history of back issues, with surgery and hardware in the back. Is this okay for her to do?

## 2022-12-10 NOTE — Telephone Encounter (Signed)
Called patient to discuss what Dr. Gerald Dexter response was.

## 2022-12-17 DIAGNOSIS — R040 Epistaxis: Secondary | ICD-10-CM | POA: Diagnosis not present

## 2022-12-21 ENCOUNTER — Ambulatory Visit
Admission: RE | Admit: 2022-12-21 | Discharge: 2022-12-21 | Disposition: A | Discharge status: Home / Self Care | Payer: Medicare PPO | Source: Ambulatory Visit | Attending: Otolaryngology | Admitting: Otolaryngology

## 2022-12-21 DIAGNOSIS — H903 Sensorineural hearing loss, bilateral: Secondary | ICD-10-CM | POA: Diagnosis not present

## 2022-12-21 DIAGNOSIS — Z923 Personal history of irradiation: Secondary | ICD-10-CM | POA: Diagnosis not present

## 2022-12-21 DIAGNOSIS — H918X3 Other specified hearing loss, bilateral: Secondary | ICD-10-CM

## 2022-12-21 MED ORDER — GADOPICLENOL 0.5 MMOL/ML IV SOLN
9.0000 mL | Freq: Once | INTRAVENOUS | Status: AC | PRN
  Administered 2022-12-21: 9 mL via INTRAVENOUS

## 2022-12-26 DIAGNOSIS — Z6829 Body mass index (BMI) 29.0-29.9, adult: Secondary | ICD-10-CM | POA: Diagnosis not present

## 2022-12-26 DIAGNOSIS — Z516 Encounter for desensitization to allergens: Secondary | ICD-10-CM | POA: Diagnosis not present

## 2022-12-26 DIAGNOSIS — L820 Inflamed seborrheic keratosis: Secondary | ICD-10-CM | POA: Diagnosis not present

## 2022-12-26 DIAGNOSIS — J4550 Severe persistent asthma, uncomplicated: Secondary | ICD-10-CM | POA: Diagnosis not present

## 2022-12-26 DIAGNOSIS — J4542 Moderate persistent asthma with status asthmaticus: Secondary | ICD-10-CM | POA: Diagnosis not present

## 2022-12-26 DIAGNOSIS — R030 Elevated blood-pressure reading, without diagnosis of hypertension: Secondary | ICD-10-CM | POA: Diagnosis not present

## 2023-01-02 DIAGNOSIS — D485 Neoplasm of uncertain behavior of skin: Secondary | ICD-10-CM | POA: Diagnosis not present

## 2023-01-02 DIAGNOSIS — L821 Other seborrheic keratosis: Secondary | ICD-10-CM | POA: Diagnosis not present

## 2023-01-02 DIAGNOSIS — Z85828 Personal history of other malignant neoplasm of skin: Secondary | ICD-10-CM | POA: Diagnosis not present

## 2023-01-02 DIAGNOSIS — L820 Inflamed seborrheic keratosis: Secondary | ICD-10-CM | POA: Diagnosis not present

## 2023-01-02 DIAGNOSIS — Z1283 Encounter for screening for malignant neoplasm of skin: Secondary | ICD-10-CM | POA: Diagnosis not present

## 2023-01-02 DIAGNOSIS — L568 Other specified acute skin changes due to ultraviolet radiation: Secondary | ICD-10-CM | POA: Diagnosis not present

## 2023-01-02 DIAGNOSIS — Z08 Encounter for follow-up examination after completed treatment for malignant neoplasm: Secondary | ICD-10-CM | POA: Diagnosis not present

## 2023-01-06 DIAGNOSIS — Z1589 Genetic susceptibility to other disease: Secondary | ICD-10-CM | POA: Diagnosis not present

## 2023-01-06 DIAGNOSIS — Z1502 Genetic susceptibility to malignant neoplasm of ovary: Secondary | ICD-10-CM | POA: Diagnosis not present

## 2023-01-06 DIAGNOSIS — C50919 Malignant neoplasm of unspecified site of unspecified female breast: Secondary | ICD-10-CM | POA: Diagnosis not present

## 2023-01-06 DIAGNOSIS — R946 Abnormal results of thyroid function studies: Secondary | ICD-10-CM | POA: Diagnosis not present

## 2023-01-06 DIAGNOSIS — Z09 Encounter for follow-up examination after completed treatment for conditions other than malignant neoplasm: Secondary | ICD-10-CM | POA: Diagnosis not present

## 2023-01-06 DIAGNOSIS — Z1509 Genetic susceptibility to other malignant neoplasm: Secondary | ICD-10-CM | POA: Diagnosis not present

## 2023-01-13 DIAGNOSIS — C50919 Malignant neoplasm of unspecified site of unspecified female breast: Secondary | ICD-10-CM | POA: Diagnosis not present

## 2023-01-13 DIAGNOSIS — Z1502 Genetic susceptibility to malignant neoplasm of ovary: Secondary | ICD-10-CM | POA: Diagnosis not present

## 2023-01-13 DIAGNOSIS — Z9011 Acquired absence of right breast and nipple: Secondary | ICD-10-CM | POA: Diagnosis not present

## 2023-01-13 DIAGNOSIS — Z9189 Other specified personal risk factors, not elsewhere classified: Secondary | ICD-10-CM | POA: Diagnosis not present

## 2023-01-13 DIAGNOSIS — Z1509 Genetic susceptibility to other malignant neoplasm: Secondary | ICD-10-CM | POA: Diagnosis not present

## 2023-01-13 DIAGNOSIS — Z1589 Genetic susceptibility to other disease: Secondary | ICD-10-CM | POA: Diagnosis not present

## 2023-01-15 DIAGNOSIS — R5383 Other fatigue: Secondary | ICD-10-CM | POA: Diagnosis not present

## 2023-01-20 DIAGNOSIS — Z516 Encounter for desensitization to allergens: Secondary | ICD-10-CM | POA: Diagnosis not present

## 2023-01-20 DIAGNOSIS — R030 Elevated blood-pressure reading, without diagnosis of hypertension: Secondary | ICD-10-CM | POA: Diagnosis not present

## 2023-01-20 DIAGNOSIS — J45909 Unspecified asthma, uncomplicated: Secondary | ICD-10-CM | POA: Diagnosis not present

## 2023-01-20 DIAGNOSIS — J4542 Moderate persistent asthma with status asthmaticus: Secondary | ICD-10-CM | POA: Diagnosis not present

## 2023-01-20 DIAGNOSIS — J4550 Severe persistent asthma, uncomplicated: Secondary | ICD-10-CM | POA: Diagnosis not present

## 2023-02-10 DIAGNOSIS — C50911 Malignant neoplasm of unspecified site of right female breast: Secondary | ICD-10-CM | POA: Diagnosis not present

## 2023-02-17 DIAGNOSIS — Z516 Encounter for desensitization to allergens: Secondary | ICD-10-CM | POA: Diagnosis not present

## 2023-02-17 DIAGNOSIS — R030 Elevated blood-pressure reading, without diagnosis of hypertension: Secondary | ICD-10-CM | POA: Diagnosis not present

## 2023-02-17 DIAGNOSIS — J45909 Unspecified asthma, uncomplicated: Secondary | ICD-10-CM | POA: Diagnosis not present

## 2023-02-17 DIAGNOSIS — J4542 Moderate persistent asthma with status asthmaticus: Secondary | ICD-10-CM | POA: Diagnosis not present

## 2023-02-17 DIAGNOSIS — J4550 Severe persistent asthma, uncomplicated: Secondary | ICD-10-CM | POA: Diagnosis not present

## 2023-02-25 ENCOUNTER — Other Ambulatory Visit: Payer: Self-pay | Admitting: Cardiology

## 2023-02-25 NOTE — Telephone Encounter (Signed)
Prescription refill request for Xarelto received.  Indication: PAF Last office visit: 12/25/21  Dominga Ferry MD Weight: 97.5kg Age: 77 Scr: 0.78 on 01/06/23  Epic CrCl: 94.44  Based on above findings Xarelto 20mg  daily is the appropriate dose.  Pt is past due for appt with Dr Wyline Mood.  Message sent to schedulers to make appt.  Refill approved x 3 to allow for appt to be made.

## 2023-03-06 DIAGNOSIS — E1165 Type 2 diabetes mellitus with hyperglycemia: Secondary | ICD-10-CM | POA: Diagnosis not present

## 2023-03-06 DIAGNOSIS — E1129 Type 2 diabetes mellitus with other diabetic kidney complication: Secondary | ICD-10-CM | POA: Diagnosis not present

## 2023-03-06 DIAGNOSIS — E7849 Other hyperlipidemia: Secondary | ICD-10-CM | POA: Diagnosis not present

## 2023-03-06 DIAGNOSIS — I10 Essential (primary) hypertension: Secondary | ICD-10-CM | POA: Diagnosis not present

## 2023-03-11 DIAGNOSIS — J310 Chronic rhinitis: Secondary | ICD-10-CM | POA: Diagnosis not present

## 2023-03-11 DIAGNOSIS — R040 Epistaxis: Secondary | ICD-10-CM | POA: Diagnosis not present

## 2023-03-11 DIAGNOSIS — H7202 Central perforation of tympanic membrane, left ear: Secondary | ICD-10-CM | POA: Diagnosis not present

## 2023-03-11 DIAGNOSIS — H903 Sensorineural hearing loss, bilateral: Secondary | ICD-10-CM | POA: Diagnosis not present

## 2023-03-13 ENCOUNTER — Ambulatory Visit: Payer: Medicare PPO | Admitting: Cardiology

## 2023-03-17 DIAGNOSIS — I10 Essential (primary) hypertension: Secondary | ICD-10-CM | POA: Diagnosis not present

## 2023-03-17 DIAGNOSIS — E119 Type 2 diabetes mellitus without complications: Secondary | ICD-10-CM | POA: Diagnosis not present

## 2023-03-17 DIAGNOSIS — C50919 Malignant neoplasm of unspecified site of unspecified female breast: Secondary | ICD-10-CM | POA: Diagnosis not present

## 2023-03-17 DIAGNOSIS — J45909 Unspecified asthma, uncomplicated: Secondary | ICD-10-CM | POA: Diagnosis not present

## 2023-03-17 DIAGNOSIS — J4542 Moderate persistent asthma with status asthmaticus: Secondary | ICD-10-CM | POA: Diagnosis not present

## 2023-03-17 DIAGNOSIS — K5900 Constipation, unspecified: Secondary | ICD-10-CM | POA: Diagnosis not present

## 2023-03-17 DIAGNOSIS — F411 Generalized anxiety disorder: Secondary | ICD-10-CM | POA: Diagnosis not present

## 2023-03-17 DIAGNOSIS — E7849 Other hyperlipidemia: Secondary | ICD-10-CM | POA: Diagnosis not present

## 2023-03-17 DIAGNOSIS — J4550 Severe persistent asthma, uncomplicated: Secondary | ICD-10-CM | POA: Diagnosis not present

## 2023-04-09 DIAGNOSIS — G4733 Obstructive sleep apnea (adult) (pediatric): Secondary | ICD-10-CM | POA: Diagnosis not present

## 2023-04-14 DIAGNOSIS — R030 Elevated blood-pressure reading, without diagnosis of hypertension: Secondary | ICD-10-CM | POA: Diagnosis not present

## 2023-04-14 DIAGNOSIS — Z516 Encounter for desensitization to allergens: Secondary | ICD-10-CM | POA: Diagnosis not present

## 2023-04-14 DIAGNOSIS — J4542 Moderate persistent asthma with status asthmaticus: Secondary | ICD-10-CM | POA: Diagnosis not present

## 2023-04-14 DIAGNOSIS — J4550 Severe persistent asthma, uncomplicated: Secondary | ICD-10-CM | POA: Diagnosis not present

## 2023-04-14 DIAGNOSIS — J45909 Unspecified asthma, uncomplicated: Secondary | ICD-10-CM | POA: Diagnosis not present

## 2023-04-16 DIAGNOSIS — B029 Zoster without complications: Secondary | ICD-10-CM | POA: Diagnosis not present

## 2023-04-17 DIAGNOSIS — L821 Other seborrheic keratosis: Secondary | ICD-10-CM | POA: Diagnosis not present

## 2023-04-17 DIAGNOSIS — L304 Erythema intertrigo: Secondary | ICD-10-CM | POA: Diagnosis not present

## 2023-04-23 NOTE — Progress Notes (Signed)
[facility] Cancer Initial Visit:  Patient Care Team: Sasser, Clarene Critchley, MD as PCP - General (Family Medicine) Wyline Mood, Dorothe Pea, MD as PCP - Cardiology (Cardiology) Glyn Ade, PA-C as Physician Assistant (Dermatology) Janalyn Harder, MD (Inactive) as Consulting Physician (Dermatology)  CHIEF COMPLAINTS/PURPOSE OF CONSULTATION:  Oncology History  PALB2-related breast cancer [facility])  07/12/2020 Cancer Staging   Staging form: Breast, AJCC 8th Edition - Clinical stage from 07/12/2020: Stage IIB (cT2, cN2(sn), cM0, G3, ER+, PR+, HER2+) - Signed by Loni Muse, MD on 10/29/2022 Histopathologic type: Adenocarcinoma, NOS Stage prefix: Initial diagnosis Method of lymph node assessment: Sentinel lymph node biopsy Histologic grading system: 3 grade system   02/07/2022 Initial Diagnosis   PALB2-related breast cancer (HCC)     HISTORY OF PRESENTING ILLNESS: Dawn Anderson 77 y.o. female is here because of  breast cancer  Medical history notable for asthma, hypertension, hyperglycemia, rosacea, paroxysmal atrial fibrillation, multiple food and environmental allergies, nasal sinus surgery, rotator cuff repair, partial hysterectomy, back surgery x2. Colonoscopy August 2016 3 small polyps removed. Hearing aids, DM Type II, vertigo. Patient diagnosed with breast cancer in November 2021 following mammography.  June 21 2020:  Right Breast Core Needle Biopsy and Node Biopsy   July 04 2020:  [facility] gene testing- No Pathogenic Variants Identified Variant of Uncertain Significance identified: PALB2 [id number] ([id number]) heterozygous  July 12, 2020: Right modified radical mastectomy, axillary node removal and PowerPort placement Pathology demonstrated a 3.2 cm, grade 3 invasive ductal carcinoma. Focal DCIS high-grade. Resection margins negative for carcinoma. Metastatic carcinoma to 4 of 17 intramamillary lymph nodes with the largest focus of metastatic carcinoma  measuring 1.5 cm. 9 lymph nodes in the right axillary tail negative for carcinoma. ER 90%/ PR 60%/ HER2 Positive (FISH)/  Ki-67 40% Pathologic stage:  Stage IIA (pT2, pN2a, cM0, G3, ER+, PR+, HER2+)   July 18, 2020: [facility] gene testing demonstrated variant of unknown significance in PALB 2 which was heterozygous.  July 24 2020: PET/CT No findings of active malignancy. Scalloped left eccentric superior endplate of T12 with associated metabolic activity along this portion of the endplate. Appearance favors a subacute endplate compression fracture, with osseous oligometastatic disease an unlikely possibility given the morphology and appearance. Postoperative findings along the right breast/chest. Anterolisthesis at L4-5.  Postoperative findings at L5-S1.  August 01, 2020: Pain in low thoracic region which began 2 to 3 days after surgery when she became more mobile. Pain interfering with sleep. Not helped by tylenol or hydrocodone. Incisions are healing well. Still has a drain in the axilla.  Cardiac ECHO. LV normal in size with upper normal wall thickness.  LVEF  65-70%. Grade I diastolic dysfunction (impaired relaxation). The aortic valve is trileaflet with mildly thickened leaflets with normal excursion. LA mildly to moderately dilated in size. RV normal in size, with normal systolic function. RA mildly dilated .  August 10 2020: Still with swelling in right axilla which is troubling her; very little pain there. Region is numb. Staples and drain removed two days ago. ECOG 2. Pain from compression fracture improved, mainly from Flexeril every 8 hours. Taking Oxycodone 5 mg, 2 to 3 tablets daily. Needs refill on Flexeril. Refered to [facility] for discussion of mastectomy site revision.  August 24 2020: Back pain OK only uses Oxycodone 5 mg at night. Still with pain and fullness, numbness in right mastectomy site. Decreased ROM right shoulder.  August 31 2020: [facility]  Consult. A completion mastectomy with complex tissue rearrangement recommended.  September 05 2019: Kyphoplasty consult. Since pain improving patient was in agreement not to proceed with kyphoplasty  September 06 2020: Pain from T12 fracture is abating. .  September 15 2020: Surgical revision of right mastectomy site at [facility]. Pathology from the surgery negative for carcinoma  September 27, 2020: Drain and sutures removed.  October 03, 2020: Back pain from compression fx resolved. Feels well enough to start treatment. Receive chemotherapy teaching.  Starting treatment next week to allow further wound healing and to recover from fatigue.   October 12 2020: Cycle 1 Taxotere/Carbo/ Herceptin  October 20 2020: Over the weekend developed severe diarrhea not helped by imodium; took two doses of 2 mg tablets today. Anorectic. Mouth feels funny but not sore. Received IVF earlier this week. Using ondansetron 8 mg up to tid. Wrote for Lomotil and somatostatin. To receive IVF. Erythema at incision site. Begin Doxycycline 100 mg bid  October 24 2020: Metallic taste improved by no longer eating foods using metal utensils.  Scalp is sore and burning and has small bumps. Rash and soreness central face and inside nose. Using [employer] skin products. (These are high end and may contain ingredients which are irritating skin). Diarrhea resolved. Eating and drinking well. Using bottled water.  October 31 2020: Fatigued in the afternoons but feels well overall. Still has some soreness in scalp. Rash in central face better. Developing alopecia.  November 02 2020: Cycle 2 Taxotere/Carbo/ Herceptin  November 20 2020: DEXA T-score of -1.5 with a 10-year positive probability of major osteoporotic fracture calculated at 24%   November 23 2020: Cycle 3 Taxotere/Carbo/ Herceptin  Dec 13 2020: Plagued by folliculitis on scalp helped by moisturizers and topical steroids prescribed by dermatologist. Dysgeusia  even to water; metallic taste. COVID booster after completing Taxotere/Carbo  Dec 14 2020: Cycle 4 Taxotere/Carbo/ Herceptin  January 04, 2021: Cycle 5 Taxotere/Carbo/ Herceptin  January 24, 2021: Cycle 6 Taxotere/Carbo/ Herceptin  February 14, 2021: Cycle 1 Herceptin  February 15 2021; Began Femara.  February 26, 2021: CT PA to evaluate shortness of breath demonstrated cardiomegaly but no evidence of PE. Bilateral lower extremity Dopplers negative for DVT  March 07, 2021: Cycle 2 Herceptin.  March 13 2021 through May 04 2021 Radiation:  Right Chest Wall/Regional Nodes (SCV/Axilla/IM) 5000 cGy in 200 cGy daily fractions followed by a mastectomy scar boost of 1000 cGy in 200 cGy daily fractions.  Developed Grade 3 radiation dematitis-resolved 1 week treatment break  March 15, 2021: Abdominal ultrasound to evaluate pain demonstrated fatty liver  March 28, 2021: Cycle 3 Herceptin  April 11, 2021: Cardiac echo LVEF 65 to 70%. Grade 1 diastolic dysfunction.  April 17, 2021: Erythematous chest wall rash due to XRT. Fatigued. ECOG 2. Has neuropathy of feet for which she is taking Lyrica 50 mg tid. Neuropathy bothers her more at night. No weight gain in the past month. Tolerating Femara well. No hot flashes. Interested in PT. Has decreased ROM in right shoulder due to tightness.  April 18, 2021: Due for cycle 4 Herceptin  August 23 2021:  Left screening mammogram BIRADS 1  September 20 2021:  Cardiac ECHO LVEF 65 to 70% October 03 2021;  Completed Herceptin  September 02 2022:  Left screening Mammogram-BI-RADS CATEGORY  1: Negative.  February 12 2022:  Portacath removed  July 18 2022:  Placed on Alendronate 35 mg po weekly.   Declined Zometa due to h/o  exacerbation of asthma from infusion previously   October 23 2022:  [facility] Medical Oncology Consult  Currently on Letrazole 2.5 mg daily.  Fatigued and has generalized arthralgias.  Depression and anxiety creeps in at night.   In March  2023 received Zometa with course complicated by fevers and chills and an exacerbation of asthma Patient is allergic to aspirin.  There are data to suggest there is a cross reactivity with bisphosphonates.   (Recommend prolia injections instead) States FSBG's were good until about a week ago when began indulging in sugar.  November 28 2022: DEXA Femoral neck T score - 1.8  Lumbar T score -0.1   Dec 21 2022:  MRI brain- to evaluate hearing loss.  Normal MRI of the internal auditory canals and inner ear  structures.  Diffuse pachymeningeal contrast enhancement, which was described on the MRI 09/13/2016. Nonspecific  finding but can be seen in the setting of idiopathic intracranial hypotension.  Chronic small vessel disease and volume loss.  April 25 2023:  Scheduled follow up for breast cancer.  Taking Vitamin D3, Calcium, Mg for osteoporosis.   Remain on Letrazole with some arthralgias and depression.   No breast changes but has episodes of right mastalgia which occurs at night.  To receive Prolia today.    July 2027- Completes Letrazole.     Review of Systems  Constitutional:  Negative for appetite change, chills, fatigue, fever and unexpected weight change.  HENT:   Negative for hearing loss, lump/mass, mouth sores, nosebleeds, sore throat, tinnitus, trouble swallowing and voice change.   Eyes:  Negative for eye problems and icterus.       Vision changes:  None  Respiratory:  Negative for chest tightness, cough, hemoptysis, shortness of breath and wheezing.        Using her CPAP regularly at night  Cardiovascular:  Negative for chest pain, leg swelling and palpitations.       PND:  none Orthopnea:  none  Gastrointestinal:  Negative for abdominal pain, blood in stool, constipation, diarrhea, nausea and vomiting.  Endocrine: Negative for hot flashes.       Cold intolerance:  none Heat intolerance:  none  Genitourinary:  Negative for bladder incontinence, difficulty urinating, dysuria,  frequency, hematuria and nocturia.   Musculoskeletal:  Positive for arthralgias and myalgias. Negative for back pain, gait problem, neck pain and neck stiffness.  Skin:  Negative for itching, rash and wound.  Neurological:  Negative for dizziness, extremity weakness, gait problem, headaches, light-headedness, seizures and speech difficulty.       Has neuropathy of hands and feet from chemotherapy helped by acupuncture  Hematological:  Negative for adenopathy. Does not bruise/bleed easily.  Psychiatric/Behavioral:  Negative for sleep disturbance and suicidal ideas. The patient is not nervous/anxious.     MEDICAL HISTORY: Past Medical History:  Diagnosis Date   Arthritis    Asthma    Atypical mole 03/21/2006   moderate atypia - left calf   Atypical mole 03/21/2006   moderate atypia - left pec   Basal cell carcinoma 03/13/1999   mid to upper back   Breast cancer (HCC) 07/12/2020   right   Diabetes mellitus without complication (HCC)    Sleep apnea    8/4    SURGICAL HISTORY: Past Surgical History:  Procedure Laterality Date   ABDOMINAL HYSTERECTOMY     BACK SURGERY     x2   BREAST SURGERY Left    lump removed   CHOLECYSTECTOMY  COLONOSCOPY N/A 03/22/2015   Procedure: COLONOSCOPY;  Surgeon: Malissa Hippo, MD;  Location: [facility];  Service: Endoscopy;  Laterality: N/A;  830 - moved to 8/17 @ 7:30 - Ann to notify pt   COLONOSCOPY WITH PROPOFOL N/A 05/21/2022   Procedure: COLONOSCOPY WITH PROPOFOL;  Surgeon: Dolores Frame, MD;  Location: [facility];  Service: Gastroenterology;  Laterality: N/A;  730 ASA 3   MASTECTOMY Right 07/12/2020   09/2020   NASAL SINUS SURGERY     POLYPECTOMY  05/21/2022   Procedure: POLYPECTOMY;  Surgeon: Dolores Frame, MD;  Location: [facility];  Service: Gastroenterology;;   SHOULDER ARTHROSCOPY W/ ROTATOR CUFF REPAIR Right     SOCIAL HISTORY: Social History   Socioeconomic History   Marital status: Widowed     Spouse name: Not on file   Number of children: 2   Years of education: Not on file   Highest education level: Master's degree (e.g., MA, MS, MEng, MEd, MSW, MBA)  Occupational History    Comment: retired  Tobacco Use   Smoking status: Never   Smokeless tobacco: Never  Vaping Use   Vaping status: Never Used  Substance and Sexual Activity   Alcohol use: Not Currently    Comment: None since first being diagnosed with cancer   Drug use: No   Sexual activity: Not Currently    Birth control/protection: Surgical    Comment: hysterectomy  Other Topics Concern   Not on file  Social History Narrative   Lives alone   Caffeine- one soda   Social Determinants of Health   Financial Resource Strain: Low Risk  (04/10/2022)   Overall Financial Resource Strain (CARDIA)    Difficulty of Paying Living Expenses: Not hard at all  Food Insecurity: No Food Insecurity (04/10/2022)   Hunger Vital Sign    Worried About Running Out of Food in the Last Year: Never true    Ran Out of Food in the Last Year: Never true  Transportation Needs: No Transportation Needs (04/10/2022)   PRAPARE - Administrator, Civil Service (Medical): No    Lack of Transportation (Non-Medical): No  Physical Activity: Sufficiently Active (04/10/2022)   Exercise Vital Sign    Days of Exercise per Week: 4 days    Minutes of Exercise per Session: 100 min  Stress: No Stress Concern Present (04/10/2022)   [employer] of Occupational Health - Occupational Stress Questionnaire    Feeling of Stress : Only a little  Social Connections: Moderately Isolated (04/10/2022)   Social Connection and Isolation Panel [NHANES]    Frequency of Communication with Friends and Family: More than three times a week    Frequency of Social Gatherings with Friends and Family: More than three times a week    Attends Religious Services: Never    Database administrator or Organizations: Yes    Attends Engineer, structural: More than 4  times per year    Marital Status: Widowed  Intimate Partner Violence: Not At Risk (04/10/2022)   Humiliation, Afraid, Rape, and Kick questionnaire    Fear of Current or Ex-Partner: No    Emotionally Abused: No    Physically Abused: No    Sexually Abused: No    FAMILY HISTORY Family History  Problem Relation Age of Onset   Alzheimer's disease Mother    Kidney disease Father    Heart disease Father    Ehlers-Danlos syndrome Son     ALLERGIES:  is  allergic to albuterol sulfate, apple juice, asa [aspirin], coconut (cocos nucifera), origanum oil, pineapple, plastibase, prunus persica, salicylates, salmeterol, strawberry (diagnostic), sulfamethizole, chocolate, clarithromycin, penicillins, latex, and morphine.  MEDICATIONS:  Current Outpatient Medications  Medication Sig Dispense Refill   ACCU-CHEK GUIDE test strip USE TO TEST ONCE DAILY     acetaminophen (TYLENOL) 500 MG tablet Take 1,000 mg by mouth every 8 (eight) hours as needed for moderate pain.     Ascorbic Acid (VITAMIN C) 1000 MG tablet Take 1,000 mg by mouth daily.     B Complex Vitamins (VITAMIN-B COMPLEX PO) Take 1 tablet by mouth daily.     cholecalciferol (VITAMIN D3) 25 MCG (1000 UNIT) tablet Take 1,000 Units by mouth daily.     Coenzyme Q10 100 MG capsule Take 100 mg by mouth daily.     EPINEPHrine 0.3 mg/0.3 mL IJ SOAJ injection Inject 0.3 mg into the muscle as needed for anaphylaxis.     fluticasone (FLONASE) 50 MCG/ACT nasal spray Place 1 spray into both nostrils in the morning and at bedtime.     letrozole (FEMARA) 2.5 MG tablet Take 2.5 mg by mouth daily.     loratadine (CLARITIN) 10 MG tablet Take 10 mg by mouth daily.     LORazepam (ATIVAN) 1 MG tablet Take 1 mg by mouth every 8 (eight) hours as needed for anxiety.     losartan-hydrochlorothiazide (HYZAAR) 50-12.5 MG tablet Take 1 tablet by mouth every morning.  2   Melatonin 10 MG CAPS Take 10 mg by mouth at bedtime.     metFORMIN (GLUCOPHAGE-XR) 500 MG 24 hr  tablet Take 500 mg by mouth 2 (two) times daily.     Milk Thistle 175 MG CAPS Take 175 mg by mouth daily.     montelukast (SINGULAIR) 10 MG tablet Take 10 mg by mouth at bedtime.     MOUNJARO 7.5 MG/0.5ML Pen Inject 7.5 mg into the skin every Wednesday.     Multiple Vitamin (MULTIVITAMIN) tablet Take 1 tablet by mouth daily.     NON FORMULARY CPAP MACHINE     nystatin-triamcinolone (MYCOLOG II) cream Apply 1 application topically 2 (two) times daily. Apply thin layer bid prn (Patient taking differently: Apply 1 application  topically 2 (two) times daily as needed (irritation).) 30 g 3   rivaroxaban (XARELTO) 20 MG TABS tablet TAKE 1 TABLET BY MOUTH DAILY WITH SUPPER 30 tablet 2   Spacer/Aero-Holding Chambers (VORTEX VALVED HOLDING CHAMBER) DEVI USE with mdi     SYMBICORT 160-4.5 MCG/ACT inhaler Inhale 2 puffs into the lungs 2 (two) times daily.     XOLAIR 150 MG/ML prefilled syringe Inject 300 mg into the skin every 28 (twenty-eight) days.     XOPENEX HFA 45 MCG/ACT inhaler Inhale 2 puffs into the lungs every 6 (six) hours as needed for shortness of breath or wheezing.     No current facility-administered medications for this visit.    PHYSICAL EXAMINATION:  ECOG PERFORMANCE STATUS: 0 - Asymptomatic   There were no vitals filed for this visit.   There were no vitals filed for this visit.    Physical Exam Vitals and nursing note reviewed. Exam conducted with a chaperone present.  Constitutional:      General: She is not in acute distress.    Appearance: She is not toxic-appearing or diaphoretic.     Comments: Here alone.  Looks well  HENT:     Head: Normocephalic and atraumatic.     Right Ear: External ear  normal.     Left Ear: External ear normal.     Nose: Nose normal. No congestion or rhinorrhea.  Eyes:     General: No scleral icterus.    Pupils: Pupils are equal, round, and reactive to light.  Cardiovascular:     Rate and Rhythm: Normal rate and regular rhythm.      Heart sounds: Normal heart sounds. No murmur heard.    No friction rub. No gallop.  Pulmonary:     Effort: Pulmonary effort is normal. No respiratory distress.     Breath sounds: Normal breath sounds. No wheezing or rales.  Chest:  Breasts:    Right: Normal. No swelling, bleeding, inverted nipple, mass, nipple discharge, skin change or tenderness.     Left: Absent.  Abdominal:     General: There is no distension.     Palpations: Abdomen is soft.     Tenderness: There is no abdominal tenderness. There is no right CVA tenderness, left CVA tenderness or guarding.  Musculoskeletal:        General: No swelling, tenderness, deformity or signs of injury.     Cervical back: Normal range of motion and neck supple. No rigidity or tenderness.     Right lower leg: No edema.     Left lower leg: No edema.  Lymphadenopathy:     Head:     Right side of head: No submental, submandibular, tonsillar, preauricular, posterior auricular or occipital adenopathy.     Left side of head: No submental, submandibular, tonsillar, preauricular, posterior auricular or occipital adenopathy.     Cervical: No cervical adenopathy.     Right cervical: No superficial, deep or posterior cervical adenopathy.    Left cervical: No superficial, deep or posterior cervical adenopathy.     Upper Body:     Right upper body: No supraclavicular, axillary or pectoral adenopathy.     Left upper body: No supraclavicular, axillary or pectoral adenopathy.     Lower Body: No right inguinal adenopathy. No left inguinal adenopathy.  Skin:    General: Skin is warm.     Coloration: Skin is not jaundiced or pale.     Findings: No erythema.  Neurological:     General: No focal deficit present.     Mental Status: She is alert and oriented to person, place, and time.     Cranial Nerves: No cranial nerve deficit.     Motor: No weakness.  Psychiatric:        Mood and Affect: Mood normal.        Behavior: Behavior normal.        Thought  Content: Thought content normal.        Judgment: Judgment normal.     LABORATORY DATA: I have personally reviewed the data as listed:  No visits with results within 1 Month(s) from this visit.  Latest known visit with results is:  Appointment on 10/23/2022  Component Date Value Ref Range Status   Phosphorus 10/23/2022 3.2  2.5 - 4.6 mg/dL Final   Performed at [facility], [address], [place], [place] [address]   Vit D, 25-Hydroxy 10/23/2022 52.21  30 - 100 ng/mL Final   Comment: (NOTE) Vitamin D deficiency has been defined by the Institute of Medicine  and an Endocrine Society practice guideline as a level of serum 25-OH  vitamin D less than 20 ng/mL (1,2). The Endocrine Society went on to  further define vitamin D insufficiency as a level between 21  and 29  ng/mL (2).  1. IOM (Institute of Medicine). 2010. Dietary reference intakes for  calcium and D. [place]: [employer]. 2. Holick MF, Binkley Waverly, Bischoff-Ferrari HA, et al. Evaluation,  treatment, and prevention of vitamin D deficiency: an Endocrine  Society clinical practice guideline, JCEM. 2011 Jul; 96(7): 1911-30.  Performed at [facility], [address], [place], [place] [address]     RADIOGRAPHIC STUDIES: I have personally reviewed the radiological images as listed and agree with the findings in the report  No results found.  ASSESSMENT/PLAN  77 y.o. female with history of asthma, hypertension, hyperglycemia, rosacea, paroxysmal atrial fibrillation, multiple food and environmental allergies, nasal sinus surgery, rotator cuff repair, partial hysterectomy, back surgery x2. Colonoscopy August 2016 3 small polyps removed. Hearing aids, DM Type II, vertigo. Patient diagnosed with breast cancer in November 2021 following mammography.   Invasive ductal carcinoma Right breast, Stage IB (T2 N1 M0) ER 90%/PR 60%/HER-2/neu positive/Ki-67 40%:  June 21 2020 Core  needle bx after found to have an abnormal mammogram.  July 12, 2020: Right modified radical mastectomy, axillary node removal and PowerPort placement July 24 2020: PET/CT No findings of active malignancy. Compression fracture superior endplate of T12 August 01 2020- Cardiac ECHO LVEF 65-70% September 15 2020: Surgical revision of right mastectomy site at [facility]. Pathology from the surgery negative for carcinoma    October 12 2020- January 24 2021-  Cycle 1 through Cycle 6  Docetaxel/ Carbo (AUC 5)/ Trastuzumab. Cr clearance overestimated by labs at 95 due to DM Type II; used 85 which would be more reasonable given age, obesity and decreased muscle mass.  Tolerated chemotherapy with expected side effects of alopecia, racial rash February 14 2021- October 03 2021- Received q3 week Herceptin February 15 2021- Began Femara September 02 2022- Left screening mammogram BIRADS 1 April 25 2023- Continue Letrazole January 2025- For follow up mammogram July 2027- To complete femara   IV access: Underwent port placement at time of original mastectomy.  Removed on February 12 2022    T12 compression fracture:  July 24 2020- Presented with back pain.  Abnormality noted on PET/CT.  Managed conservatively.   September 04 2020- Referred for consideration of kyphoplasty but since pain was improving this was held   Genetic testing:  July 04 2020- [employer] testing performed because of breast cancer diagnosis and history of colon polyps. Demonstrated variant of unknown significance in PALB 2 which was heterozygous.   Osteoporosis:  Risk factors are age, gender, post menopausal status, chemo history and use of an AI  December 2021- T12 compression fracture  March 2023- Treatment attempted with Zometa but developed asthma exacerbation  July 18 2022- Alendronate recommended March 2024-  Recommend Prolia injections instead of bisphosphonates.  Patient has allergy to aspirin and there is cross reactivity  between ASA and bisphosphonates.  Patient in agreement.  I will arrange  November 28 2022: DEXA Femoral neck T score - 1.8  Lumbar T score -0.1   April 25 2023- To receive Prolia      Cancer Staging  PALB2-related breast cancer [facility]) Staging form: Breast, AJCC 8th Edition - Clinical stage from 07/12/2020: Stage IIB (cT2, cN2(sn), cM0, G3, ER+, PR+, HER2+) - Signed by Loni Muse, MD on 10/29/2022 Histopathologic type: Adenocarcinoma, NOS Stage prefix: Initial diagnosis Method of lymph node assessment: Sentinel lymph node biopsy Histologic grading system: 3 grade system    No problem-specific Assessment & Plan notes found  for this encounter.    No orders of the defined types were placed in this encounter.   40  minutes was spent in patient care.  This included time spent preparing to see the patient (e.g., review of tests), obtaining and/or reviewing separately obtained history, counseling and educating the patient/family/caregiver, ordering medications, tests, or procedures; documenting clinical information in the electronic or other health record, independently interpreting results and communicating results to the patient/family/caregiver as well as coordination of care.       All questions were answered. The patient knows to call the clinic with any problems, questions or concerns.  This note was electronically signed.    Loni Muse, MD  04/23/2023 9:20 AM

## 2023-04-25 ENCOUNTER — Ambulatory Visit: Payer: Medicare PPO | Admitting: Oncology

## 2023-04-25 ENCOUNTER — Inpatient Hospital Stay: Payer: Medicare PPO

## 2023-04-25 ENCOUNTER — Encounter: Payer: Self-pay | Admitting: Oncology

## 2023-04-25 ENCOUNTER — Other Ambulatory Visit: Payer: Medicare PPO

## 2023-04-25 ENCOUNTER — Inpatient Hospital Stay: Payer: Medicare PPO | Attending: Oncology | Admitting: Oncology

## 2023-04-25 ENCOUNTER — Ambulatory Visit: Payer: Medicare PPO

## 2023-04-25 VITALS — BP 123/73 | HR 100 | Temp 98.4°F | Resp 16 | Ht 67.3 in | Wt 198.0 lb

## 2023-04-25 VITALS — BP 121/65 | HR 101 | Temp 98.7°F | Resp 16 | Ht 67.3 in | Wt 198.0 lb

## 2023-04-25 DIAGNOSIS — Z923 Personal history of irradiation: Secondary | ICD-10-CM | POA: Diagnosis not present

## 2023-04-25 DIAGNOSIS — Z1502 Genetic susceptibility to malignant neoplasm of ovary: Secondary | ICD-10-CM

## 2023-04-25 DIAGNOSIS — Z9221 Personal history of antineoplastic chemotherapy: Secondary | ICD-10-CM

## 2023-04-25 DIAGNOSIS — Z1589 Genetic susceptibility to other disease: Secondary | ICD-10-CM

## 2023-04-25 DIAGNOSIS — Z170 Estrogen receptor positive status [ER+]: Secondary | ICD-10-CM | POA: Insufficient documentation

## 2023-04-25 DIAGNOSIS — C50911 Malignant neoplasm of unspecified site of right female breast: Secondary | ICD-10-CM | POA: Insufficient documentation

## 2023-04-25 DIAGNOSIS — Z780 Asymptomatic menopausal state: Secondary | ICD-10-CM

## 2023-04-25 DIAGNOSIS — Z1509 Genetic susceptibility to other malignant neoplasm: Secondary | ICD-10-CM

## 2023-04-25 DIAGNOSIS — M810 Age-related osteoporosis without current pathological fracture: Secondary | ICD-10-CM | POA: Insufficient documentation

## 2023-04-25 DIAGNOSIS — C50919 Malignant neoplasm of unspecified site of unspecified female breast: Secondary | ICD-10-CM | POA: Diagnosis not present

## 2023-04-25 DIAGNOSIS — Z9011 Acquired absence of right breast and nipple: Secondary | ICD-10-CM | POA: Diagnosis not present

## 2023-04-25 DIAGNOSIS — Z79811 Long term (current) use of aromatase inhibitors: Secondary | ICD-10-CM

## 2023-04-25 DIAGNOSIS — T458X5D Adverse effect of other primarily systemic and hematological agents, subsequent encounter: Secondary | ICD-10-CM

## 2023-04-25 DIAGNOSIS — S22000A Wedge compression fracture of unspecified thoracic vertebra, initial encounter for closed fracture: Secondary | ICD-10-CM

## 2023-04-25 DIAGNOSIS — M8580 Other specified disorders of bone density and structure, unspecified site: Secondary | ICD-10-CM

## 2023-04-25 LAB — CBC WITH DIFFERENTIAL (CANCER CENTER ONLY)
Abs Immature Granulocytes: 0.02 10*3/uL (ref 0.00–0.07)
Basophils Absolute: 0.1 10*3/uL (ref 0.0–0.1)
Basophils Relative: 1 %
Eosinophils Absolute: 0.1 10*3/uL (ref 0.0–0.5)
Eosinophils Relative: 1 %
HCT: 45.5 % (ref 36.0–46.0)
Hemoglobin: 15.1 g/dL — ABNORMAL HIGH (ref 12.0–15.0)
Immature Granulocytes: 0 %
Lymphocytes Relative: 25 %
Lymphs Abs: 1.7 10*3/uL (ref 0.7–4.0)
MCH: 30.4 pg (ref 26.0–34.0)
MCHC: 33.2 g/dL (ref 30.0–36.0)
MCV: 91.7 fL (ref 80.0–100.0)
Monocytes Absolute: 0.5 10*3/uL (ref 0.1–1.0)
Monocytes Relative: 7 %
Neutro Abs: 4.6 10*3/uL (ref 1.7–7.7)
Neutrophils Relative %: 66 %
Platelet Count: 337 10*3/uL (ref 150–400)
RBC: 4.96 MIL/uL (ref 3.87–5.11)
RDW: 15 % (ref 11.5–15.5)
WBC Count: 7 10*3/uL (ref 4.0–10.5)
nRBC: 0 % (ref 0.0–0.2)

## 2023-04-25 LAB — COMPREHENSIVE METABOLIC PANEL
Albumin: 4.5 (ref 3.5–5.0)
Calcium: 9.9 (ref 8.7–10.7)

## 2023-04-25 LAB — BASIC METABOLIC PANEL
BUN: 21 (ref 4–21)
CO2: 29 — AB (ref 13–22)
Chloride: 101 (ref 99–108)
Creatinine: 0.7 (ref 0.5–1.1)
Glucose: 120
Potassium: 3.8 mEq/L (ref 3.5–5.1)
Sodium: 136 — AB (ref 137–147)

## 2023-04-25 LAB — HEPATIC FUNCTION PANEL
ALT: 28 U/L (ref 7–35)
AST: 33 (ref 13–35)
Alkaline Phosphatase: 82 (ref 25–125)
Bilirubin, Total: 0.9

## 2023-04-25 MED ORDER — DENOSUMAB 60 MG/ML ~~LOC~~ SOSY
60.0000 mg | PREFILLED_SYRINGE | Freq: Once | SUBCUTANEOUS | Status: AC
  Administered 2023-04-25: 60 mg via SUBCUTANEOUS

## 2023-04-26 LAB — VITAMIN D 25 HYDROXY (VIT D DEFICIENCY, FRACTURES): Vit D, 25-Hydroxy: 47.33 ng/mL (ref 30–100)

## 2023-04-29 ENCOUNTER — Ambulatory Visit: Payer: Medicare PPO | Attending: Cardiology | Admitting: Nurse Practitioner

## 2023-04-29 ENCOUNTER — Encounter: Payer: Self-pay | Admitting: Nurse Practitioner

## 2023-04-29 VITALS — BP 102/68 | HR 98 | Ht 68.0 in | Wt 193.0 lb

## 2023-04-29 DIAGNOSIS — Z1589 Genetic susceptibility to other disease: Secondary | ICD-10-CM | POA: Diagnosis not present

## 2023-04-29 DIAGNOSIS — C50919 Malignant neoplasm of unspecified site of unspecified female breast: Secondary | ICD-10-CM

## 2023-04-29 DIAGNOSIS — I10 Essential (primary) hypertension: Secondary | ICD-10-CM | POA: Diagnosis not present

## 2023-04-29 DIAGNOSIS — Z1509 Genetic susceptibility to other malignant neoplasm: Secondary | ICD-10-CM

## 2023-04-29 DIAGNOSIS — Z1502 Genetic susceptibility to malignant neoplasm of ovary: Secondary | ICD-10-CM | POA: Diagnosis not present

## 2023-04-29 DIAGNOSIS — I480 Paroxysmal atrial fibrillation: Secondary | ICD-10-CM

## 2023-04-29 NOTE — Progress Notes (Addendum)
Cardiology Office Note:  .   Date:  04/29/2023 ID:  Dawn Anderson, Dawn Anderson Jul 30, 1946, MRN [id number] PCP: Estanislado Pandy, MD  [facility] Providers Cardiologist:  Dina Rich, MD    History of Present Illness: .   Dawn Anderson is a 77 y.o. female with a PMH of PAF, HTN, allergies, breast cancer, T2DM, OSA on CPAP, who presents today for 1 year follow-up.   Last seen by Dr. Dina Rich over 1 year ago. Was doing well at last office visit. HR well controlled without AV nodal agents at the time.   Today she presents for 1 year follow-up. Being followed by Oncologist in [place]. Admits to fatigue and depression related to her cancer. Denies any chest pain, shortness of breath, palpitations, syncope, presyncope, dizziness, orthopnea, PND, swelling or significant weight changes, acute bleeding, or claudication.   SH: Retired Engineer, site.   ROS: Negative. See HPI.   Studies Reviewed: Marland Kitchen    EKG: EKG Interpretation Date/Time:  Tuesday April 29 2023 13:12:42 EDT Ventricular Rate:  100 PR Interval:  208 QRS Duration:  64 QT Interval:  334 QTC Calculation: 430 R Axis:   72  Text Interpretation: Normal sinus rhythm Low voltage QRS Septal infarct , age undetermined No previous ECGs available Confirmed by Sharlene Dory [phone]) on 04/29/2023 1:17:52 PM    Physical Exam:   VS:  BP 102/68   Pulse 98   Ht 5\' 8"  (1.727 m)   Wt 193 lb (87.5 kg) Comment: per patient  SpO2 95%   BMI 29.35 kg/m    Wt Readings from Last 3 Encounters:  04/29/23 193 lb (87.5 kg)  04/25/23 198 lb (89.8 kg)  04/25/23 198 lb (89.8 kg)    GEN: Well nourished, well developed in no acute distress NECK: No JVD; No carotid bruits CARDIAC: S1/S2, RRR, no murmurs, rubs, gallops RESPIRATORY:  Clear to auscultation without rales, wheezing or rhonchi  ABDOMEN: Soft, non-tender, non-distended EXTREMITIES:  No edema; No deformity   ASSESSMENT AND PLAN: .    PAF Denies any tachycardia or  palpitations. HR slightly fast today d/t recent stress. In the past, HR has been well controlled without AV nodal agents. Will continue to monitor. No medication changes at this time. Heart healthy diet and regular cardiovascular exercise encouraged.   HTN BP stable. Discussed to monitor BP at home at least 2 hours after medications and sitting for 5-10 minutes. Continue current medication regimen. Heart healthy diet and regular cardiovascular exercise encouraged.   Breast Cancer PALB2-related breast cancer. Admits to depression and fatigue related to diagnosis. Denies any red flag signs/symptoms. Continue to follow-up with Oncology and medical team at office. Continue to follow with PCP. Support given.  Dispo: Follow-up with Dr. Dina Rich or APP in 1 year or sooner if anything changes.   Signed, Sharlene Dory, NP

## 2023-04-29 NOTE — Patient Instructions (Addendum)

## 2023-05-01 ENCOUNTER — Telehealth: Payer: Self-pay | Admitting: Oncology

## 2023-05-01 NOTE — Telephone Encounter (Signed)
Contacted pt to schedule an appt. Unable to reach via phone, voicemail was left.   Message Received: Today Hunt, Leonette Monarch, CMA  P [facility] Scheduling Patient wants a call back to schedule her 6 month follow up

## 2023-05-05 ENCOUNTER — Ambulatory Visit: Payer: Medicare PPO | Admitting: Adult Health

## 2023-05-13 DIAGNOSIS — R030 Elevated blood-pressure reading, without diagnosis of hypertension: Secondary | ICD-10-CM | POA: Diagnosis not present

## 2023-05-13 DIAGNOSIS — J4542 Moderate persistent asthma with status asthmaticus: Secondary | ICD-10-CM | POA: Diagnosis not present

## 2023-05-13 DIAGNOSIS — J4550 Severe persistent asthma, uncomplicated: Secondary | ICD-10-CM | POA: Diagnosis not present

## 2023-05-13 DIAGNOSIS — J45909 Unspecified asthma, uncomplicated: Secondary | ICD-10-CM | POA: Diagnosis not present

## 2023-05-13 DIAGNOSIS — Z516 Encounter for desensitization to allergens: Secondary | ICD-10-CM | POA: Diagnosis not present

## 2023-05-21 ENCOUNTER — Encounter: Payer: Self-pay | Admitting: Oncology

## 2023-05-26 ENCOUNTER — Other Ambulatory Visit: Payer: Self-pay | Admitting: Cardiology

## 2023-05-26 NOTE — Telephone Encounter (Signed)
Prescription refill request for Xarelto received.  Indication: PAF Last office visit: 04/29/23  Shawnie Dapper NP Weight: 87.5 Age: 77 Scr: 0.7 on 04/25/23 CrCl:  94.44  Based on above findings Xarelto 20mg  daily is the appropriate dose.  Refill approved.

## 2023-05-27 ENCOUNTER — Encounter: Payer: Self-pay | Admitting: Oncology

## 2023-05-28 DIAGNOSIS — M19071 Primary osteoarthritis, right ankle and foot: Secondary | ICD-10-CM | POA: Diagnosis not present

## 2023-05-28 DIAGNOSIS — M7989 Other specified soft tissue disorders: Secondary | ICD-10-CM | POA: Diagnosis not present

## 2023-05-28 DIAGNOSIS — R2241 Localized swelling, mass and lump, right lower limb: Secondary | ICD-10-CM | POA: Diagnosis not present

## 2023-05-28 DIAGNOSIS — M79671 Pain in right foot: Secondary | ICD-10-CM | POA: Diagnosis not present

## 2023-06-05 DIAGNOSIS — E1165 Type 2 diabetes mellitus with hyperglycemia: Secondary | ICD-10-CM | POA: Diagnosis not present

## 2023-06-05 DIAGNOSIS — G5793 Unspecified mononeuropathy of bilateral lower limbs: Secondary | ICD-10-CM | POA: Diagnosis not present

## 2023-06-05 DIAGNOSIS — E782 Mixed hyperlipidemia: Secondary | ICD-10-CM | POA: Diagnosis not present

## 2023-06-05 DIAGNOSIS — J4540 Moderate persistent asthma, uncomplicated: Secondary | ICD-10-CM | POA: Diagnosis not present

## 2023-06-09 DIAGNOSIS — Z516 Encounter for desensitization to allergens: Secondary | ICD-10-CM | POA: Diagnosis not present

## 2023-06-09 DIAGNOSIS — R030 Elevated blood-pressure reading, without diagnosis of hypertension: Secondary | ICD-10-CM | POA: Diagnosis not present

## 2023-06-09 DIAGNOSIS — J45909 Unspecified asthma, uncomplicated: Secondary | ICD-10-CM | POA: Diagnosis not present

## 2023-06-09 DIAGNOSIS — J4550 Severe persistent asthma, uncomplicated: Secondary | ICD-10-CM | POA: Diagnosis not present

## 2023-06-09 DIAGNOSIS — J4542 Moderate persistent asthma with status asthmaticus: Secondary | ICD-10-CM | POA: Diagnosis not present

## 2023-06-12 ENCOUNTER — Ambulatory Visit: Payer: Medicare PPO | Admitting: Adult Health

## 2023-06-12 ENCOUNTER — Encounter: Payer: Self-pay | Admitting: Adult Health

## 2023-06-12 VITALS — BP 118/80 | HR 83 | Ht 68.0 in | Wt 196.0 lb

## 2023-06-12 DIAGNOSIS — Z1211 Encounter for screening for malignant neoplasm of colon: Secondary | ICD-10-CM | POA: Diagnosis not present

## 2023-06-12 DIAGNOSIS — Z01419 Encounter for gynecological examination (general) (routine) without abnormal findings: Secondary | ICD-10-CM | POA: Diagnosis not present

## 2023-06-12 DIAGNOSIS — Z1330 Encounter for screening examination for mental health and behavioral disorders, unspecified: Secondary | ICD-10-CM

## 2023-06-12 DIAGNOSIS — Z90710 Acquired absence of both cervix and uterus: Secondary | ICD-10-CM | POA: Diagnosis not present

## 2023-06-12 DIAGNOSIS — Z853 Personal history of malignant neoplasm of breast: Secondary | ICD-10-CM

## 2023-06-12 DIAGNOSIS — Z9011 Acquired absence of right breast and nipple: Secondary | ICD-10-CM | POA: Diagnosis not present

## 2023-06-12 LAB — HEMOCCULT GUIAC POC 1CARD (OFFICE): Fecal Occult Blood, POC: NEGATIVE

## 2023-06-12 MED ORDER — NYSTATIN-TRIAMCINOLONE 100000-0.1 UNIT/GM-% EX CREA: 1.0000 | TOPICAL_CREAM | Freq: Two times a day (BID) | CUTANEOUS | 3 refills | Status: AC

## 2023-06-12 NOTE — Progress Notes (Signed)
Patient ID: Dawn Anderson, female   DOB: 11-Sep-1945, 77 y.o.   MRN: [id number] History of Present Illness:  Dawn Anderson is a 77 year old white female, widowed, sp hysterectomy, in for well woman gyn exam. She had breast cancer diagnosed in 2020, right mastectomy on 2021 and had chemo and radiation and on Femara, now and feels achy,depressed, and low energy.  She requests refill on Mycolog.  Going on Cruise in January.   Current Medications, Allergies, Past Medical History, Past Surgical History, Family History and Social History were reviewed in [employer] record.     Review of Systems: Patient denies any headaches, hearing loss, fatigue, blurred vision, shortness of breath, chest pain(has chest wall pain at times on right), abdominal pain, problems with bowel movements, urination, or intercourse(not active). No joint pain or mood swings.  See HPI for positives   Physical Exam:BP 118/80 (BP Location: Left Arm, Patient Position: Sitting, Cuff Size: Normal)   Pulse 83   Ht 5\' 8"  (1.727 m)   Wt 196 lb (88.9 kg)   BMI 29.80 kg/m   General:  Well developed, well nourished, no acute distress Skin:  Warm and dry Neck:  Midline trachea, normal thyroid, good ROM, no lymphadenopathy,no carotid bruits heard  Lungs; Clear to auscultation bilaterally Breast:  No dominant palpable mass, retraction, or nipple discharge, on the left. Right breast is surgically absent, no masses felt on chest wall.  Cardiovascular: Regular rate and rhythm Abdomen:  Soft, non tender, no hepatosplenomegaly Pelvic:  External genitalia is normal in appearance, no lesions.  The vagina is pale.  Urethra has caruncle present. The cervix and uterus are absent.  No adnexal masses or tenderness noted.Bladder is non tender, no masses felt. Rectal: Good sphincter tone, no polyps, or hemorrhoids felt.  Hemoccult negative. Extremities/musculoskeletal:  No swelling or varicosities noted, no clubbing or  cyanosis Psych:  No mood changes, alert and cooperative,seems happy AA is 0 Fall risk is low    06/12/2023    1:29 PM 04/10/2022   11:13 AM 04/25/2020   10:55 AM  Depression screen PHQ 2/9  Decreased Interest 0 0 0  Down, Depressed, Hopeless 1 0 0  PHQ - 2 Score 1 0 0  Altered sleeping 0 0 0  Tired, decreased energy 1 2 1   Change in appetite 0 0 0  Feeling bad or failure about yourself  0 0 0  Trouble concentrating 0 0 0  Moving slowly or fidgety/restless 0 0 0  Suicidal thoughts 0 0 0  PHQ-9 Score 2 2 1        06/12/2023    1:29 PM 04/10/2022   11:13 AM 04/25/2020   10:55 AM  GAD 7 : Generalized Anxiety Score  Nervous, Anxious, on Edge 1 1 1   Control/stop worrying 0 0 0  Worry too much - different things 0 0 0  Trouble relaxing 0 1 0  Restless 0 0 1  Easily annoyed or irritable 0 0 1  Afraid - awful might happen 0 0 0  Total GAD 7 Score 1 2 3       Upstream - 06/12/23 1335       Pregnancy Intention Screening   Does the patient want to become pregnant in the next year? N/A    Does the patient's partner want to become pregnant in the next year? N/A    Would the patient like to discuss contraceptive options today? N/A      Contraception Wrap Up  Current Method Female Sterilization   hyst   End Method Female Sterilization   hyst   Contraception Counseling Provided No             Examination chaperoned by Malachy Mood LPN  Impression and Plan: 1. Encounter for well woman exam with routine gynecological exam GYN physical in 2 years if desired Colonoscopy per GI, had 2023 Left mammogram as [facility] at [facility] with PCP and oncologist  Refilled mycolog at her request Meds ordered this encounter  Medications   nystatin-triamcinolone (MYCOLOG II) cream    Sig: Apply 1 Application topically 2 (two) times daily. Apply thin layer bid prn    Dispense:  30 g    Refill:  3    Order Specific Question:   Supervising Provider    Answer:   Duane Lope H [[id number]]    Stay active   2. Encounter for screening fecal occult blood testing Hemoccult was negative  - POCT occult blood stool  3. S/P hysterectomy  4. History of modified radical mastectomy of right breast  5. History of breast cancer Taking femara

## 2023-07-07 DIAGNOSIS — J4550 Severe persistent asthma, uncomplicated: Secondary | ICD-10-CM | POA: Diagnosis not present

## 2023-07-07 DIAGNOSIS — J45909 Unspecified asthma, uncomplicated: Secondary | ICD-10-CM | POA: Diagnosis not present

## 2023-07-07 DIAGNOSIS — R030 Elevated blood-pressure reading, without diagnosis of hypertension: Secondary | ICD-10-CM | POA: Diagnosis not present

## 2023-07-07 DIAGNOSIS — J4542 Moderate persistent asthma with status asthmaticus: Secondary | ICD-10-CM | POA: Diagnosis not present

## 2023-07-07 DIAGNOSIS — Z516 Encounter for desensitization to allergens: Secondary | ICD-10-CM | POA: Diagnosis not present

## 2023-07-09 DIAGNOSIS — Z1589 Genetic susceptibility to other disease: Secondary | ICD-10-CM | POA: Diagnosis not present

## 2023-07-09 DIAGNOSIS — C50919 Malignant neoplasm of unspecified site of unspecified female breast: Secondary | ICD-10-CM | POA: Diagnosis not present

## 2023-07-09 DIAGNOSIS — Z1509 Genetic susceptibility to other malignant neoplasm: Secondary | ICD-10-CM | POA: Diagnosis not present

## 2023-07-09 DIAGNOSIS — Z9011 Acquired absence of right breast and nipple: Secondary | ICD-10-CM | POA: Diagnosis not present

## 2023-07-09 DIAGNOSIS — Z9189 Other specified personal risk factors, not elsewhere classified: Secondary | ICD-10-CM | POA: Diagnosis not present

## 2023-07-09 DIAGNOSIS — Z1502 Genetic susceptibility to malignant neoplasm of ovary: Secondary | ICD-10-CM | POA: Diagnosis not present

## 2023-07-10 DIAGNOSIS — L820 Inflamed seborrheic keratosis: Secondary | ICD-10-CM | POA: Diagnosis not present

## 2023-07-14 DIAGNOSIS — E119 Type 2 diabetes mellitus without complications: Secondary | ICD-10-CM | POA: Diagnosis not present

## 2023-07-14 DIAGNOSIS — I10 Essential (primary) hypertension: Secondary | ICD-10-CM | POA: Diagnosis not present

## 2023-07-14 DIAGNOSIS — E7849 Other hyperlipidemia: Secondary | ICD-10-CM | POA: Diagnosis not present

## 2023-07-14 DIAGNOSIS — E1165 Type 2 diabetes mellitus with hyperglycemia: Secondary | ICD-10-CM | POA: Diagnosis not present

## 2023-07-16 ENCOUNTER — Telehealth: Payer: Self-pay

## 2023-07-16 DIAGNOSIS — C50919 Malignant neoplasm of unspecified site of unspecified female breast: Secondary | ICD-10-CM | POA: Diagnosis not present

## 2023-07-16 DIAGNOSIS — Z1589 Genetic susceptibility to other disease: Secondary | ICD-10-CM | POA: Diagnosis not present

## 2023-07-16 DIAGNOSIS — Z1502 Genetic susceptibility to malignant neoplasm of ovary: Secondary | ICD-10-CM | POA: Diagnosis not present

## 2023-07-16 DIAGNOSIS — Z1509 Genetic susceptibility to other malignant neoplasm: Secondary | ICD-10-CM | POA: Diagnosis not present

## 2023-07-16 DIAGNOSIS — Z9189 Other specified personal risk factors, not elsewhere classified: Secondary | ICD-10-CM | POA: Diagnosis not present

## 2023-07-16 DIAGNOSIS — Z9011 Acquired absence of right breast and nipple: Secondary | ICD-10-CM | POA: Diagnosis not present

## 2023-07-16 NOTE — Telephone Encounter (Signed)
Patient has been advised as below, and expressed understanding.

## 2023-07-16 NOTE — Telephone Encounter (Signed)
-----   Message from Loni Muse sent at 07/16/2023  9:30 AM EST ----- Can hold letrazole for a few weeks.  She can try sublingual B12  Thanks ----- Message ----- From: Arville Care, CMA Sent: 07/15/2023   4:00 PM EST To: Loni Muse, MD  Patient called wanting to know if she can stop her Letrozole for 2-3 weeks due to her fatigue, body aches and slight depression? She is also wondering if you think B-12 infusions would help with these symptoms?

## 2023-07-17 DIAGNOSIS — E7849 Other hyperlipidemia: Secondary | ICD-10-CM | POA: Diagnosis not present

## 2023-07-17 DIAGNOSIS — J45909 Unspecified asthma, uncomplicated: Secondary | ICD-10-CM | POA: Diagnosis not present

## 2023-07-17 DIAGNOSIS — E119 Type 2 diabetes mellitus without complications: Secondary | ICD-10-CM | POA: Diagnosis not present

## 2023-07-17 DIAGNOSIS — K5900 Constipation, unspecified: Secondary | ICD-10-CM | POA: Diagnosis not present

## 2023-07-17 DIAGNOSIS — J4542 Moderate persistent asthma with status asthmaticus: Secondary | ICD-10-CM | POA: Diagnosis not present

## 2023-07-17 DIAGNOSIS — C50919 Malignant neoplasm of unspecified site of unspecified female breast: Secondary | ICD-10-CM | POA: Diagnosis not present

## 2023-07-17 DIAGNOSIS — Z516 Encounter for desensitization to allergens: Secondary | ICD-10-CM | POA: Diagnosis not present

## 2023-07-17 DIAGNOSIS — I10 Essential (primary) hypertension: Secondary | ICD-10-CM | POA: Diagnosis not present

## 2023-07-17 DIAGNOSIS — F411 Generalized anxiety disorder: Secondary | ICD-10-CM | POA: Diagnosis not present

## 2023-07-24 ENCOUNTER — Encounter: Payer: Self-pay | Admitting: Genetic Counselor

## 2023-07-24 DIAGNOSIS — Z1379 Encounter for other screening for genetic and chromosomal anomalies: Secondary | ICD-10-CM | POA: Insufficient documentation

## 2023-08-04 DIAGNOSIS — J4550 Severe persistent asthma, uncomplicated: Secondary | ICD-10-CM | POA: Diagnosis not present

## 2023-08-04 DIAGNOSIS — R030 Elevated blood-pressure reading, without diagnosis of hypertension: Secondary | ICD-10-CM | POA: Diagnosis not present

## 2023-08-04 DIAGNOSIS — J45909 Unspecified asthma, uncomplicated: Secondary | ICD-10-CM | POA: Diagnosis not present

## 2023-08-04 DIAGNOSIS — J4542 Moderate persistent asthma with status asthmaticus: Secondary | ICD-10-CM | POA: Diagnosis not present

## 2023-08-04 DIAGNOSIS — Z516 Encounter for desensitization to allergens: Secondary | ICD-10-CM | POA: Diagnosis not present

## 2023-08-12 DIAGNOSIS — Z1589 Genetic susceptibility to other disease: Secondary | ICD-10-CM | POA: Diagnosis not present

## 2023-08-12 DIAGNOSIS — Z1509 Genetic susceptibility to other malignant neoplasm: Secondary | ICD-10-CM | POA: Diagnosis not present

## 2023-08-12 DIAGNOSIS — Z1502 Genetic susceptibility to malignant neoplasm of ovary: Secondary | ICD-10-CM | POA: Diagnosis not present

## 2023-08-12 DIAGNOSIS — C50919 Malignant neoplasm of unspecified site of unspecified female breast: Secondary | ICD-10-CM | POA: Diagnosis not present

## 2023-08-28 DIAGNOSIS — C50919 Malignant neoplasm of unspecified site of unspecified female breast: Secondary | ICD-10-CM | POA: Diagnosis not present

## 2023-08-28 DIAGNOSIS — Z1509 Genetic susceptibility to other malignant neoplasm: Secondary | ICD-10-CM | POA: Diagnosis not present

## 2023-08-28 DIAGNOSIS — L03113 Cellulitis of right upper limb: Secondary | ICD-10-CM | POA: Diagnosis not present

## 2023-08-28 DIAGNOSIS — Z1589 Genetic susceptibility to other disease: Secondary | ICD-10-CM | POA: Diagnosis not present

## 2023-08-28 DIAGNOSIS — Z1502 Genetic susceptibility to malignant neoplasm of ovary: Secondary | ICD-10-CM | POA: Diagnosis not present

## 2023-09-01 DIAGNOSIS — J4550 Severe persistent asthma, uncomplicated: Secondary | ICD-10-CM | POA: Diagnosis not present

## 2023-09-01 DIAGNOSIS — J4540 Moderate persistent asthma, uncomplicated: Secondary | ICD-10-CM | POA: Diagnosis not present

## 2023-09-01 DIAGNOSIS — J4542 Moderate persistent asthma with status asthmaticus: Secondary | ICD-10-CM | POA: Diagnosis not present

## 2023-09-07 ENCOUNTER — Other Ambulatory Visit: Payer: Self-pay | Admitting: Pharmacist

## 2023-09-23 DIAGNOSIS — Z6829 Body mass index (BMI) 29.0-29.9, adult: Secondary | ICD-10-CM | POA: Diagnosis not present

## 2023-09-23 DIAGNOSIS — R030 Elevated blood-pressure reading, without diagnosis of hypertension: Secondary | ICD-10-CM | POA: Diagnosis not present

## 2023-09-23 DIAGNOSIS — L0390 Cellulitis, unspecified: Secondary | ICD-10-CM | POA: Diagnosis not present

## 2023-09-30 DIAGNOSIS — J4550 Severe persistent asthma, uncomplicated: Secondary | ICD-10-CM | POA: Diagnosis not present

## 2023-09-30 DIAGNOSIS — J4540 Moderate persistent asthma, uncomplicated: Secondary | ICD-10-CM | POA: Diagnosis not present

## 2023-09-30 DIAGNOSIS — J4542 Moderate persistent asthma with status asthmaticus: Secondary | ICD-10-CM | POA: Diagnosis not present

## 2023-10-01 DIAGNOSIS — H0102A Squamous blepharitis right eye, upper and lower eyelids: Secondary | ICD-10-CM | POA: Diagnosis not present

## 2023-10-01 DIAGNOSIS — E119 Type 2 diabetes mellitus without complications: Secondary | ICD-10-CM | POA: Diagnosis not present

## 2023-10-01 DIAGNOSIS — Z961 Presence of intraocular lens: Secondary | ICD-10-CM | POA: Diagnosis not present

## 2023-10-01 DIAGNOSIS — H0102B Squamous blepharitis left eye, upper and lower eyelids: Secondary | ICD-10-CM | POA: Diagnosis not present

## 2023-10-01 DIAGNOSIS — H1045 Other chronic allergic conjunctivitis: Secondary | ICD-10-CM | POA: Diagnosis not present

## 2023-10-03 DIAGNOSIS — J4550 Severe persistent asthma, uncomplicated: Secondary | ICD-10-CM | POA: Diagnosis not present

## 2023-10-03 DIAGNOSIS — E1129 Type 2 diabetes mellitus with other diabetic kidney complication: Secondary | ICD-10-CM | POA: Diagnosis not present

## 2023-10-03 DIAGNOSIS — E782 Mixed hyperlipidemia: Secondary | ICD-10-CM | POA: Diagnosis not present

## 2023-10-10 DIAGNOSIS — Z1231 Encounter for screening mammogram for malignant neoplasm of breast: Secondary | ICD-10-CM | POA: Diagnosis not present

## 2023-10-15 DIAGNOSIS — Z1509 Genetic susceptibility to other malignant neoplasm: Secondary | ICD-10-CM | POA: Diagnosis not present

## 2023-10-15 DIAGNOSIS — C50919 Malignant neoplasm of unspecified site of unspecified female breast: Secondary | ICD-10-CM | POA: Diagnosis not present

## 2023-10-15 DIAGNOSIS — Z1502 Genetic susceptibility to malignant neoplasm of ovary: Secondary | ICD-10-CM | POA: Diagnosis not present

## 2023-10-15 DIAGNOSIS — Z1589 Genetic susceptibility to other disease: Secondary | ICD-10-CM | POA: Diagnosis not present

## 2023-10-16 ENCOUNTER — Telehealth: Payer: Self-pay | Admitting: Oncology

## 2023-10-16 NOTE — Telephone Encounter (Signed)
 Pt called office back to confirm that her appt was cancelled at our facility. States that she would only like to see Dr. Angelene Giovanni and did not want to reschedule with another provider.

## 2023-10-20 DIAGNOSIS — I890 Lymphedema, not elsewhere classified: Secondary | ICD-10-CM | POA: Diagnosis not present

## 2023-10-20 DIAGNOSIS — Z1509 Genetic susceptibility to other malignant neoplasm: Secondary | ICD-10-CM | POA: Diagnosis not present

## 2023-10-20 DIAGNOSIS — C50919 Malignant neoplasm of unspecified site of unspecified female breast: Secondary | ICD-10-CM | POA: Diagnosis not present

## 2023-10-20 DIAGNOSIS — Z1502 Genetic susceptibility to malignant neoplasm of ovary: Secondary | ICD-10-CM | POA: Diagnosis not present

## 2023-10-20 DIAGNOSIS — Z1589 Genetic susceptibility to other disease: Secondary | ICD-10-CM | POA: Diagnosis not present

## 2023-10-22 DIAGNOSIS — Z1502 Genetic susceptibility to malignant neoplasm of ovary: Secondary | ICD-10-CM | POA: Diagnosis not present

## 2023-10-22 DIAGNOSIS — C50919 Malignant neoplasm of unspecified site of unspecified female breast: Secondary | ICD-10-CM | POA: Diagnosis not present

## 2023-10-22 DIAGNOSIS — I890 Lymphedema, not elsewhere classified: Secondary | ICD-10-CM | POA: Diagnosis not present

## 2023-10-22 DIAGNOSIS — Z1589 Genetic susceptibility to other disease: Secondary | ICD-10-CM | POA: Diagnosis not present

## 2023-10-22 DIAGNOSIS — Z1509 Genetic susceptibility to other malignant neoplasm: Secondary | ICD-10-CM | POA: Diagnosis not present

## 2023-10-23 DIAGNOSIS — I890 Lymphedema, not elsewhere classified: Secondary | ICD-10-CM | POA: Diagnosis not present

## 2023-10-27 DIAGNOSIS — Z1502 Genetic susceptibility to malignant neoplasm of ovary: Secondary | ICD-10-CM | POA: Diagnosis not present

## 2023-10-27 DIAGNOSIS — Z1589 Genetic susceptibility to other disease: Secondary | ICD-10-CM | POA: Diagnosis not present

## 2023-10-27 DIAGNOSIS — I890 Lymphedema, not elsewhere classified: Secondary | ICD-10-CM | POA: Diagnosis not present

## 2023-10-27 DIAGNOSIS — C50919 Malignant neoplasm of unspecified site of unspecified female breast: Secondary | ICD-10-CM | POA: Diagnosis not present

## 2023-10-27 DIAGNOSIS — Z1509 Genetic susceptibility to other malignant neoplasm: Secondary | ICD-10-CM | POA: Diagnosis not present

## 2023-10-28 DIAGNOSIS — J45909 Unspecified asthma, uncomplicated: Secondary | ICD-10-CM | POA: Diagnosis not present

## 2023-10-28 DIAGNOSIS — J4540 Moderate persistent asthma, uncomplicated: Secondary | ICD-10-CM | POA: Diagnosis not present

## 2023-10-29 ENCOUNTER — Ambulatory Visit: Payer: Medicare PPO

## 2023-10-29 ENCOUNTER — Other Ambulatory Visit: Payer: Medicare PPO

## 2023-10-29 ENCOUNTER — Ambulatory Visit: Payer: Medicare PPO | Admitting: Oncology

## 2023-10-29 ENCOUNTER — Inpatient Hospital Stay: Payer: Medicare PPO

## 2023-10-29 DIAGNOSIS — C50919 Malignant neoplasm of unspecified site of unspecified female breast: Secondary | ICD-10-CM | POA: Diagnosis not present

## 2023-10-29 DIAGNOSIS — Z1502 Genetic susceptibility to malignant neoplasm of ovary: Secondary | ICD-10-CM | POA: Diagnosis not present

## 2023-10-29 DIAGNOSIS — I890 Lymphedema, not elsewhere classified: Secondary | ICD-10-CM | POA: Diagnosis not present

## 2023-10-29 DIAGNOSIS — Z1589 Genetic susceptibility to other disease: Secondary | ICD-10-CM | POA: Diagnosis not present

## 2023-10-29 DIAGNOSIS — Z1509 Genetic susceptibility to other malignant neoplasm: Secondary | ICD-10-CM | POA: Diagnosis not present

## 2023-11-03 DIAGNOSIS — Z1509 Genetic susceptibility to other malignant neoplasm: Secondary | ICD-10-CM | POA: Diagnosis not present

## 2023-11-03 DIAGNOSIS — J4550 Severe persistent asthma, uncomplicated: Secondary | ICD-10-CM | POA: Diagnosis not present

## 2023-11-03 DIAGNOSIS — E782 Mixed hyperlipidemia: Secondary | ICD-10-CM | POA: Diagnosis not present

## 2023-11-03 DIAGNOSIS — E1129 Type 2 diabetes mellitus with other diabetic kidney complication: Secondary | ICD-10-CM | POA: Diagnosis not present

## 2023-11-03 DIAGNOSIS — I890 Lymphedema, not elsewhere classified: Secondary | ICD-10-CM | POA: Diagnosis not present

## 2023-11-03 DIAGNOSIS — Z1502 Genetic susceptibility to malignant neoplasm of ovary: Secondary | ICD-10-CM | POA: Diagnosis not present

## 2023-11-03 DIAGNOSIS — C50919 Malignant neoplasm of unspecified site of unspecified female breast: Secondary | ICD-10-CM | POA: Diagnosis not present

## 2023-11-03 DIAGNOSIS — Z1589 Genetic susceptibility to other disease: Secondary | ICD-10-CM | POA: Diagnosis not present

## 2023-11-04 DIAGNOSIS — C50919 Malignant neoplasm of unspecified site of unspecified female breast: Secondary | ICD-10-CM | POA: Diagnosis not present

## 2023-11-04 DIAGNOSIS — Z1509 Genetic susceptibility to other malignant neoplasm: Secondary | ICD-10-CM | POA: Diagnosis not present

## 2023-11-04 DIAGNOSIS — Z1589 Genetic susceptibility to other disease: Secondary | ICD-10-CM | POA: Diagnosis not present

## 2023-11-04 DIAGNOSIS — I890 Lymphedema, not elsewhere classified: Secondary | ICD-10-CM | POA: Diagnosis not present

## 2023-11-04 DIAGNOSIS — Z1502 Genetic susceptibility to malignant neoplasm of ovary: Secondary | ICD-10-CM | POA: Diagnosis not present

## 2023-11-06 ENCOUNTER — Other Ambulatory Visit: Payer: Self-pay | Admitting: Cardiology

## 2023-11-06 NOTE — Telephone Encounter (Signed)
 Prescription refill request for Xarelto received.  Indication: afib  Last office visit: 04/29/2023 Weight: 88.9 kg  Age: 78 yo  Scr:  0.79 07/09/2023 CrCl: 84 ml/min   Refill sent.

## 2023-11-10 DIAGNOSIS — Z1589 Genetic susceptibility to other disease: Secondary | ICD-10-CM | POA: Diagnosis not present

## 2023-11-10 DIAGNOSIS — C50919 Malignant neoplasm of unspecified site of unspecified female breast: Secondary | ICD-10-CM | POA: Diagnosis not present

## 2023-11-10 DIAGNOSIS — Z1502 Genetic susceptibility to malignant neoplasm of ovary: Secondary | ICD-10-CM | POA: Diagnosis not present

## 2023-11-10 DIAGNOSIS — I890 Lymphedema, not elsewhere classified: Secondary | ICD-10-CM | POA: Diagnosis not present

## 2023-11-10 DIAGNOSIS — Z1509 Genetic susceptibility to other malignant neoplasm: Secondary | ICD-10-CM | POA: Diagnosis not present

## 2023-11-11 DIAGNOSIS — E7849 Other hyperlipidemia: Secondary | ICD-10-CM | POA: Diagnosis not present

## 2023-11-11 DIAGNOSIS — R5383 Other fatigue: Secondary | ICD-10-CM | POA: Diagnosis not present

## 2023-11-11 DIAGNOSIS — E782 Mixed hyperlipidemia: Secondary | ICD-10-CM | POA: Diagnosis not present

## 2023-11-11 DIAGNOSIS — E1129 Type 2 diabetes mellitus with other diabetic kidney complication: Secondary | ICD-10-CM | POA: Diagnosis not present

## 2023-11-12 ENCOUNTER — Ambulatory Visit (INDEPENDENT_AMBULATORY_CARE_PROVIDER_SITE_OTHER): Payer: Medicare PPO

## 2023-11-13 DIAGNOSIS — Z1502 Genetic susceptibility to malignant neoplasm of ovary: Secondary | ICD-10-CM | POA: Diagnosis not present

## 2023-11-13 DIAGNOSIS — I890 Lymphedema, not elsewhere classified: Secondary | ICD-10-CM | POA: Diagnosis not present

## 2023-11-13 DIAGNOSIS — Z1589 Genetic susceptibility to other disease: Secondary | ICD-10-CM | POA: Diagnosis not present

## 2023-11-13 DIAGNOSIS — Z1509 Genetic susceptibility to other malignant neoplasm: Secondary | ICD-10-CM | POA: Diagnosis not present

## 2023-11-13 DIAGNOSIS — C50919 Malignant neoplasm of unspecified site of unspecified female breast: Secondary | ICD-10-CM | POA: Diagnosis not present

## 2023-11-17 DIAGNOSIS — J4550 Severe persistent asthma, uncomplicated: Secondary | ICD-10-CM | POA: Diagnosis not present

## 2023-11-17 DIAGNOSIS — E782 Mixed hyperlipidemia: Secondary | ICD-10-CM | POA: Diagnosis not present

## 2023-11-17 DIAGNOSIS — I4891 Unspecified atrial fibrillation: Secondary | ICD-10-CM | POA: Diagnosis not present

## 2023-11-17 DIAGNOSIS — Z6829 Body mass index (BMI) 29.0-29.9, adult: Secondary | ICD-10-CM | POA: Diagnosis not present

## 2023-11-17 DIAGNOSIS — G4730 Sleep apnea, unspecified: Secondary | ICD-10-CM | POA: Diagnosis not present

## 2023-11-17 DIAGNOSIS — E7849 Other hyperlipidemia: Secondary | ICD-10-CM | POA: Diagnosis not present

## 2023-11-19 DIAGNOSIS — I890 Lymphedema, not elsewhere classified: Secondary | ICD-10-CM | POA: Diagnosis not present

## 2023-11-19 DIAGNOSIS — Z1589 Genetic susceptibility to other disease: Secondary | ICD-10-CM | POA: Diagnosis not present

## 2023-11-19 DIAGNOSIS — Z1509 Genetic susceptibility to other malignant neoplasm: Secondary | ICD-10-CM | POA: Diagnosis not present

## 2023-11-19 DIAGNOSIS — C50919 Malignant neoplasm of unspecified site of unspecified female breast: Secondary | ICD-10-CM | POA: Diagnosis not present

## 2023-11-19 DIAGNOSIS — Z1502 Genetic susceptibility to malignant neoplasm of ovary: Secondary | ICD-10-CM | POA: Diagnosis not present

## 2023-12-01 DIAGNOSIS — J4550 Severe persistent asthma, uncomplicated: Secondary | ICD-10-CM | POA: Diagnosis not present

## 2023-12-01 DIAGNOSIS — R030 Elevated blood-pressure reading, without diagnosis of hypertension: Secondary | ICD-10-CM | POA: Diagnosis not present

## 2023-12-01 DIAGNOSIS — J4542 Moderate persistent asthma with status asthmaticus: Secondary | ICD-10-CM | POA: Diagnosis not present

## 2023-12-01 DIAGNOSIS — C50911 Malignant neoplasm of unspecified site of right female breast: Secondary | ICD-10-CM | POA: Diagnosis not present

## 2023-12-03 DIAGNOSIS — J4550 Severe persistent asthma, uncomplicated: Secondary | ICD-10-CM | POA: Diagnosis not present

## 2023-12-03 DIAGNOSIS — E782 Mixed hyperlipidemia: Secondary | ICD-10-CM | POA: Diagnosis not present

## 2023-12-03 DIAGNOSIS — E1129 Type 2 diabetes mellitus with other diabetic kidney complication: Secondary | ICD-10-CM | POA: Diagnosis not present

## 2023-12-04 DIAGNOSIS — D3615 Benign neoplasm of peripheral nerves and autonomic nervous system of abdomen: Secondary | ICD-10-CM | POA: Diagnosis not present

## 2023-12-04 DIAGNOSIS — L820 Inflamed seborrheic keratosis: Secondary | ICD-10-CM | POA: Diagnosis not present

## 2023-12-04 DIAGNOSIS — D216 Benign neoplasm of connective and other soft tissue of trunk, unspecified: Secondary | ICD-10-CM | POA: Diagnosis not present

## 2023-12-08 DIAGNOSIS — Z1502 Genetic susceptibility to malignant neoplasm of ovary: Secondary | ICD-10-CM | POA: Diagnosis not present

## 2023-12-08 DIAGNOSIS — C50919 Malignant neoplasm of unspecified site of unspecified female breast: Secondary | ICD-10-CM | POA: Diagnosis not present

## 2023-12-08 DIAGNOSIS — I890 Lymphedema, not elsewhere classified: Secondary | ICD-10-CM | POA: Diagnosis not present

## 2023-12-08 DIAGNOSIS — Z1589 Genetic susceptibility to other disease: Secondary | ICD-10-CM | POA: Diagnosis not present

## 2023-12-08 DIAGNOSIS — Z1509 Genetic susceptibility to other malignant neoplasm: Secondary | ICD-10-CM | POA: Diagnosis not present

## 2023-12-10 DIAGNOSIS — I890 Lymphedema, not elsewhere classified: Secondary | ICD-10-CM | POA: Diagnosis not present

## 2023-12-17 DIAGNOSIS — C50919 Malignant neoplasm of unspecified site of unspecified female breast: Secondary | ICD-10-CM | POA: Diagnosis not present

## 2023-12-17 DIAGNOSIS — I890 Lymphedema, not elsewhere classified: Secondary | ICD-10-CM | POA: Diagnosis not present

## 2023-12-17 DIAGNOSIS — Z1589 Genetic susceptibility to other disease: Secondary | ICD-10-CM | POA: Diagnosis not present

## 2023-12-17 DIAGNOSIS — Z1509 Genetic susceptibility to other malignant neoplasm: Secondary | ICD-10-CM | POA: Diagnosis not present

## 2023-12-17 DIAGNOSIS — Z1502 Genetic susceptibility to malignant neoplasm of ovary: Secondary | ICD-10-CM | POA: Diagnosis not present

## 2023-12-30 DIAGNOSIS — J4542 Moderate persistent asthma with status asthmaticus: Secondary | ICD-10-CM | POA: Diagnosis not present

## 2023-12-30 DIAGNOSIS — J4550 Severe persistent asthma, uncomplicated: Secondary | ICD-10-CM | POA: Diagnosis not present

## 2023-12-31 DIAGNOSIS — G4733 Obstructive sleep apnea (adult) (pediatric): Secondary | ICD-10-CM | POA: Diagnosis not present

## 2023-12-31 DIAGNOSIS — J4550 Severe persistent asthma, uncomplicated: Secondary | ICD-10-CM | POA: Diagnosis not present

## 2024-01-02 DIAGNOSIS — E782 Mixed hyperlipidemia: Secondary | ICD-10-CM | POA: Diagnosis not present

## 2024-01-02 DIAGNOSIS — J4550 Severe persistent asthma, uncomplicated: Secondary | ICD-10-CM | POA: Diagnosis not present

## 2024-01-02 DIAGNOSIS — E1129 Type 2 diabetes mellitus with other diabetic kidney complication: Secondary | ICD-10-CM | POA: Diagnosis not present

## 2024-01-10 DIAGNOSIS — I890 Lymphedema, not elsewhere classified: Secondary | ICD-10-CM | POA: Diagnosis not present

## 2024-01-23 DIAGNOSIS — J4550 Severe persistent asthma, uncomplicated: Secondary | ICD-10-CM | POA: Diagnosis not present

## 2024-01-23 DIAGNOSIS — J4540 Moderate persistent asthma, uncomplicated: Secondary | ICD-10-CM | POA: Diagnosis not present

## 2024-01-23 DIAGNOSIS — J4542 Moderate persistent asthma with status asthmaticus: Secondary | ICD-10-CM | POA: Diagnosis not present

## 2024-02-02 DIAGNOSIS — E1129 Type 2 diabetes mellitus with other diabetic kidney complication: Secondary | ICD-10-CM | POA: Diagnosis not present

## 2024-02-02 DIAGNOSIS — J4550 Severe persistent asthma, uncomplicated: Secondary | ICD-10-CM | POA: Diagnosis not present

## 2024-02-02 DIAGNOSIS — E782 Mixed hyperlipidemia: Secondary | ICD-10-CM | POA: Diagnosis not present

## 2024-02-09 ENCOUNTER — Encounter (INDEPENDENT_AMBULATORY_CARE_PROVIDER_SITE_OTHER): Payer: Self-pay | Admitting: Otolaryngology

## 2024-02-09 ENCOUNTER — Ambulatory Visit (INDEPENDENT_AMBULATORY_CARE_PROVIDER_SITE_OTHER): Admitting: Otolaryngology

## 2024-02-09 VITALS — BP 138/88 | HR 93

## 2024-02-09 DIAGNOSIS — Z9629 Presence of other otological and audiological implants: Secondary | ICD-10-CM

## 2024-02-09 DIAGNOSIS — I890 Lymphedema, not elsewhere classified: Secondary | ICD-10-CM | POA: Diagnosis not present

## 2024-02-09 DIAGNOSIS — Z09 Encounter for follow-up examination after completed treatment for conditions other than malignant neoplasm: Secondary | ICD-10-CM | POA: Diagnosis not present

## 2024-02-09 DIAGNOSIS — H608X3 Other otitis externa, bilateral: Secondary | ICD-10-CM | POA: Diagnosis not present

## 2024-02-09 DIAGNOSIS — Z8669 Personal history of other diseases of the nervous system and sense organs: Secondary | ICD-10-CM | POA: Diagnosis not present

## 2024-02-09 DIAGNOSIS — H7202 Central perforation of tympanic membrane, left ear: Secondary | ICD-10-CM

## 2024-02-09 DIAGNOSIS — Z6829 Body mass index (BMI) 29.0-29.9, adult: Secondary | ICD-10-CM | POA: Diagnosis not present

## 2024-02-09 DIAGNOSIS — M5442 Lumbago with sciatica, left side: Secondary | ICD-10-CM | POA: Diagnosis not present

## 2024-02-09 DIAGNOSIS — H903 Sensorineural hearing loss, bilateral: Secondary | ICD-10-CM

## 2024-02-09 DIAGNOSIS — E1129 Type 2 diabetes mellitus with other diabetic kidney complication: Secondary | ICD-10-CM | POA: Diagnosis not present

## 2024-02-09 DIAGNOSIS — J4550 Severe persistent asthma, uncomplicated: Secondary | ICD-10-CM | POA: Diagnosis not present

## 2024-02-09 DIAGNOSIS — H6982 Other specified disorders of Eustachian tube, left ear: Secondary | ICD-10-CM

## 2024-02-09 NOTE — Progress Notes (Unsigned)
 Patient ID: Dawn Anderson, female   DOB: 1945-09-22, 77 y.o.   MRN: [id number]  Follow-up: Recurrent epistaxis, hearing loss, left ear eustachian tube dysfunction  HPI:  The patient is a 78 year old female who returns today for her follow-up evaluation.  The patient has a history of bilateral high-frequency sensorineural hearing loss and left ear eustachian tube dysfunction.  She underwent left myringotomy and tube placement.  She also has a history of recurrent epistaxis.  She was treated with endoscopic left nasal cautery in May 2024.  The patient returns today complaining of itchy sensation in her ears.  She uses Elocon cream as needed.  She denies any significant change in her hearing.  She has not noted any significant bleeding since her last visit.  Currently she denies any otalgia, otorrhea, or vertigo.  She also denies any facial pain or fever.  Exam: General: Communicates without difficulty, well nourished, no acute distress. Head: Normocephalic, no evidence injury, no tenderness, facial buttresses intact without stepoff. Face/sinus: No tenderness to palpation and percussion. Facial movement is normal and symmetric. Eyes: PERRL, EOMI. No scleral icterus, conjunctivae clear. Neuro: CN II exam reveals vision grossly intact.  No nystagmus at any point of gaze. Ears: Auricles well formed without lesions.  Ear canals with eczematous changes.  The left ventilating tube is in place and patent.  Nose: External evaluation reveals normal support and skin without lesions.  Dorsum is intact.  Anterior rhinoscopy reveals congested mucosa over anterior aspect of inferior turbinates and intact septum.  No bleeding or purulence noted. Oral:  Oral cavity and oropharynx are intact, symmetric, without erythema or edema.  Mucosa is moist without lesions. Neck: Full range of motion without pain.  There is no significant lymphadenopathy.  No masses palpable.  Thyroid  bed within normal limits to palpation.  Parotid glands  and submandibular glands equal bilaterally without mass.  Trachea is midline. Neuro:  CN 2-12 grossly intact.   Assessment: 1.  Subjectively stable bilateral sensorineural hearing loss. 2.  Bilateral chronic eczematous otitis externa. 3.  The left ventilating tube is in place and patent. 4.  Her recurrent epistaxis is currently under control.  Plan: 1.  Physical exam findings are reviewed with the patient. 2.  Continue the use of Elocon cream as needed. 3.  Continue dry ear precautions on the left side. 4.  Nasal saline gel as needed. 5.  The patient will return for evaluation in 1 year, sooner if needed.

## 2024-02-10 DIAGNOSIS — H7202 Central perforation of tympanic membrane, left ear: Secondary | ICD-10-CM | POA: Insufficient documentation

## 2024-02-10 DIAGNOSIS — H608X3 Other otitis externa, bilateral: Secondary | ICD-10-CM | POA: Insufficient documentation

## 2024-02-10 DIAGNOSIS — H6982 Other specified disorders of Eustachian tube, left ear: Secondary | ICD-10-CM | POA: Insufficient documentation

## 2024-02-10 DIAGNOSIS — H903 Sensorineural hearing loss, bilateral: Secondary | ICD-10-CM | POA: Insufficient documentation

## 2024-02-24 DIAGNOSIS — J4550 Severe persistent asthma, uncomplicated: Secondary | ICD-10-CM | POA: Diagnosis not present

## 2024-03-02 DIAGNOSIS — M48061 Spinal stenosis, lumbar region without neurogenic claudication: Secondary | ICD-10-CM | POA: Diagnosis not present

## 2024-03-02 DIAGNOSIS — M5126 Other intervertebral disc displacement, lumbar region: Secondary | ICD-10-CM | POA: Diagnosis not present

## 2024-03-02 DIAGNOSIS — M4807 Spinal stenosis, lumbosacral region: Secondary | ICD-10-CM | POA: Diagnosis not present

## 2024-03-02 DIAGNOSIS — M4316 Spondylolisthesis, lumbar region: Secondary | ICD-10-CM | POA: Diagnosis not present

## 2024-03-02 DIAGNOSIS — M4327 Fusion of spine, lumbosacral region: Secondary | ICD-10-CM | POA: Diagnosis not present

## 2024-03-02 DIAGNOSIS — M4854XA Collapsed vertebra, not elsewhere classified, thoracic region, initial encounter for fracture: Secondary | ICD-10-CM | POA: Diagnosis not present

## 2024-03-04 DIAGNOSIS — J4550 Severe persistent asthma, uncomplicated: Secondary | ICD-10-CM | POA: Diagnosis not present

## 2024-03-04 DIAGNOSIS — E782 Mixed hyperlipidemia: Secondary | ICD-10-CM | POA: Diagnosis not present

## 2024-03-04 DIAGNOSIS — E1129 Type 2 diabetes mellitus with other diabetic kidney complication: Secondary | ICD-10-CM | POA: Diagnosis not present

## 2024-03-08 DIAGNOSIS — Z6829 Body mass index (BMI) 29.0-29.9, adult: Secondary | ICD-10-CM | POA: Diagnosis not present

## 2024-03-08 DIAGNOSIS — M5442 Lumbago with sciatica, left side: Secondary | ICD-10-CM | POA: Diagnosis not present

## 2024-03-11 DIAGNOSIS — I890 Lymphedema, not elsewhere classified: Secondary | ICD-10-CM | POA: Diagnosis not present

## 2024-03-23 DIAGNOSIS — J4540 Moderate persistent asthma, uncomplicated: Secondary | ICD-10-CM | POA: Diagnosis not present

## 2024-03-24 DIAGNOSIS — E1129 Type 2 diabetes mellitus with other diabetic kidney complication: Secondary | ICD-10-CM | POA: Diagnosis not present

## 2024-03-24 DIAGNOSIS — Z1329 Encounter for screening for other suspected endocrine disorder: Secondary | ICD-10-CM | POA: Diagnosis not present

## 2024-03-24 DIAGNOSIS — R5383 Other fatigue: Secondary | ICD-10-CM | POA: Diagnosis not present

## 2024-03-31 DIAGNOSIS — Z1389 Encounter for screening for other disorder: Secondary | ICD-10-CM | POA: Diagnosis not present

## 2024-03-31 DIAGNOSIS — Z0000 Encounter for general adult medical examination without abnormal findings: Secondary | ICD-10-CM | POA: Diagnosis not present

## 2024-03-31 DIAGNOSIS — Z1331 Encounter for screening for depression: Secondary | ICD-10-CM | POA: Diagnosis not present

## 2024-03-31 DIAGNOSIS — M5442 Lumbago with sciatica, left side: Secondary | ICD-10-CM | POA: Diagnosis not present

## 2024-03-31 DIAGNOSIS — E7849 Other hyperlipidemia: Secondary | ICD-10-CM | POA: Diagnosis not present

## 2024-03-31 DIAGNOSIS — E1129 Type 2 diabetes mellitus with other diabetic kidney complication: Secondary | ICD-10-CM | POA: Diagnosis not present

## 2024-03-31 DIAGNOSIS — G4730 Sleep apnea, unspecified: Secondary | ICD-10-CM | POA: Diagnosis not present

## 2024-03-31 DIAGNOSIS — J4550 Severe persistent asthma, uncomplicated: Secondary | ICD-10-CM | POA: Diagnosis not present

## 2024-03-31 DIAGNOSIS — Z0001 Encounter for general adult medical examination with abnormal findings: Secondary | ICD-10-CM | POA: Diagnosis not present

## 2024-04-02 DIAGNOSIS — E782 Mixed hyperlipidemia: Secondary | ICD-10-CM | POA: Diagnosis not present

## 2024-04-02 DIAGNOSIS — E1129 Type 2 diabetes mellitus with other diabetic kidney complication: Secondary | ICD-10-CM | POA: Diagnosis not present

## 2024-04-02 DIAGNOSIS — J4550 Severe persistent asthma, uncomplicated: Secondary | ICD-10-CM | POA: Diagnosis not present

## 2024-04-11 DIAGNOSIS — I890 Lymphedema, not elsewhere classified: Secondary | ICD-10-CM | POA: Diagnosis not present

## 2024-04-20 DIAGNOSIS — J4542 Moderate persistent asthma with status asthmaticus: Secondary | ICD-10-CM | POA: Diagnosis not present

## 2024-04-20 DIAGNOSIS — J4550 Severe persistent asthma, uncomplicated: Secondary | ICD-10-CM | POA: Diagnosis not present

## 2024-04-26 DIAGNOSIS — Z1589 Genetic susceptibility to other disease: Secondary | ICD-10-CM | POA: Diagnosis not present

## 2024-04-26 DIAGNOSIS — Z1509 Genetic susceptibility to other malignant neoplasm: Secondary | ICD-10-CM | POA: Diagnosis not present

## 2024-04-26 DIAGNOSIS — Z1502 Genetic susceptibility to malignant neoplasm of ovary: Secondary | ICD-10-CM | POA: Diagnosis not present

## 2024-04-26 DIAGNOSIS — C50919 Malignant neoplasm of unspecified site of unspecified female breast: Secondary | ICD-10-CM | POA: Diagnosis not present

## 2024-04-28 DIAGNOSIS — J4540 Moderate persistent asthma, uncomplicated: Secondary | ICD-10-CM | POA: Diagnosis not present

## 2024-04-28 DIAGNOSIS — G4733 Obstructive sleep apnea (adult) (pediatric): Secondary | ICD-10-CM | POA: Diagnosis not present

## 2024-04-28 DIAGNOSIS — J4550 Severe persistent asthma, uncomplicated: Secondary | ICD-10-CM | POA: Diagnosis not present

## 2024-05-03 DIAGNOSIS — C50919 Malignant neoplasm of unspecified site of unspecified female breast: Secondary | ICD-10-CM | POA: Diagnosis not present

## 2024-05-03 DIAGNOSIS — Z1502 Genetic susceptibility to malignant neoplasm of ovary: Secondary | ICD-10-CM | POA: Diagnosis not present

## 2024-05-03 DIAGNOSIS — Z1509 Genetic susceptibility to other malignant neoplasm: Secondary | ICD-10-CM | POA: Diagnosis not present

## 2024-05-03 DIAGNOSIS — Z1589 Genetic susceptibility to other disease: Secondary | ICD-10-CM | POA: Diagnosis not present

## 2024-05-04 ENCOUNTER — Other Ambulatory Visit: Payer: Self-pay | Admitting: Cardiology

## 2024-05-04 DIAGNOSIS — J4550 Severe persistent asthma, uncomplicated: Secondary | ICD-10-CM | POA: Diagnosis not present

## 2024-05-04 DIAGNOSIS — E782 Mixed hyperlipidemia: Secondary | ICD-10-CM | POA: Diagnosis not present

## 2024-05-04 DIAGNOSIS — E1129 Type 2 diabetes mellitus with other diabetic kidney complication: Secondary | ICD-10-CM | POA: Diagnosis not present

## 2024-05-04 NOTE — Telephone Encounter (Signed)
 Prescription refill request for Xarelto  received.  Indication: PAF Last office visit: 04/29/23  FORBES Crate NP Weight: 91.1kg Age: 78 Scr: 0.64 on 04/26/24  Epic CrCl: 105.87  Based on above findings Xarelto  20mg  daily is the appropriate dose.  Refill approved.

## 2024-05-10 ENCOUNTER — Ambulatory Visit: Admitting: Nurse Practitioner

## 2024-05-11 DIAGNOSIS — I890 Lymphedema, not elsewhere classified: Secondary | ICD-10-CM | POA: Diagnosis not present

## 2024-05-18 DIAGNOSIS — J4550 Severe persistent asthma, uncomplicated: Secondary | ICD-10-CM | POA: Diagnosis not present

## 2024-05-18 DIAGNOSIS — J4542 Moderate persistent asthma with status asthmaticus: Secondary | ICD-10-CM | POA: Diagnosis not present

## 2024-05-19 ENCOUNTER — Encounter (INDEPENDENT_AMBULATORY_CARE_PROVIDER_SITE_OTHER): Payer: Self-pay | Admitting: Gastroenterology

## 2024-05-24 DIAGNOSIS — M79671 Pain in right foot: Secondary | ICD-10-CM | POA: Diagnosis not present

## 2024-05-28 DIAGNOSIS — M48062 Spinal stenosis, lumbar region with neurogenic claudication: Secondary | ICD-10-CM | POA: Diagnosis not present

## 2024-05-28 DIAGNOSIS — M4316 Spondylolisthesis, lumbar region: Secondary | ICD-10-CM | POA: Diagnosis not present

## 2024-05-28 DIAGNOSIS — Z6829 Body mass index (BMI) 29.0-29.9, adult: Secondary | ICD-10-CM | POA: Diagnosis not present

## 2024-05-31 ENCOUNTER — Ambulatory Visit: Attending: Nurse Practitioner | Admitting: Nurse Practitioner

## 2024-05-31 ENCOUNTER — Telehealth (HOSPITAL_BASED_OUTPATIENT_CLINIC_OR_DEPARTMENT_OTHER): Payer: Self-pay

## 2024-05-31 ENCOUNTER — Encounter: Payer: Self-pay | Admitting: Nurse Practitioner

## 2024-05-31 VITALS — BP 114/70 | HR 80 | Ht 68.0 in | Wt 192.0 lb

## 2024-05-31 DIAGNOSIS — Z01810 Encounter for preprocedural cardiovascular examination: Secondary | ICD-10-CM

## 2024-05-31 DIAGNOSIS — C50919 Malignant neoplasm of unspecified site of unspecified female breast: Secondary | ICD-10-CM

## 2024-05-31 DIAGNOSIS — Z01818 Encounter for other preprocedural examination: Secondary | ICD-10-CM

## 2024-05-31 DIAGNOSIS — Z6829 Body mass index (BMI) 29.0-29.9, adult: Secondary | ICD-10-CM | POA: Diagnosis not present

## 2024-05-31 DIAGNOSIS — E119 Type 2 diabetes mellitus without complications: Secondary | ICD-10-CM | POA: Diagnosis not present

## 2024-05-31 DIAGNOSIS — J4550 Severe persistent asthma, uncomplicated: Secondary | ICD-10-CM | POA: Diagnosis not present

## 2024-05-31 DIAGNOSIS — I10 Essential (primary) hypertension: Secondary | ICD-10-CM

## 2024-05-31 DIAGNOSIS — Z1589 Genetic susceptibility to other disease: Secondary | ICD-10-CM

## 2024-05-31 DIAGNOSIS — I480 Paroxysmal atrial fibrillation: Secondary | ICD-10-CM | POA: Diagnosis not present

## 2024-05-31 DIAGNOSIS — Z1501 Genetic susceptibility to malignant neoplasm of breast: Secondary | ICD-10-CM | POA: Diagnosis not present

## 2024-05-31 DIAGNOSIS — I4891 Unspecified atrial fibrillation: Secondary | ICD-10-CM | POA: Diagnosis not present

## 2024-05-31 NOTE — Progress Notes (Unsigned)
 Cardiology Office Note:  .   Date: 05/31/2024 ID:  Dawn Anderson, Dawn Anderson 1945/09/28, MRN [id number] PCP: Jolee Elsie RAMAN, [place]  [facility] Providers Cardiologist:  Alvan Carrier, MD    History of Present Illness: .   Dawn Anderson is a 78 y.o. female with a PMH of PAF, HTN, allergies, breast cancer, T2DM, OSA on CPAP, who presents today for 1 year follow-up.   Last seen by Dr. Carrier Alvan over 1 year ago. Was doing well at last office visit. HR well controlled without AV nodal agents at the time.   04/28/2024 - Today she presents for 1 year follow-up. Being followed by Oncologist in [place]. Admits to fatigue and depression related to her cancer. Denies any chest pain, shortness of breath, palpitations, syncope, presyncope, dizziness, orthopnea, PND, swelling or significant weight changes, acute bleeding, or claudication.   She is pending injection and PLIF surgery.  Injections scheduled for June 15, 2024.  She is here for pre-op clearance. She states she is doing well from a cardiac standpoint.  She has been having back issues, scheduled for MRI tomorrow of her right foot, says she has a ganglion cyst that she feels will be operated on in the future. Activity is limited due to her back pain. Denies any chest pain, shortness of breath, palpitations, syncope, presyncope, dizziness, orthopnea, PND, swelling or significant weight changes, acute bleeding, or claudication.  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   ROS: Negative. See HPI.   Studies Reviewed: SABRA    EKG:  EKG Interpretation Date/Time:  Monday May 31 2024 15:09:10 EDT Ventricular Rate:  81 PR Interval:  186 QRS Duration:  70 QT Interval:  356 QTC Calculation: 413 R Axis:   63  Text Interpretation: Sinus rhythm with Premature atrial complexes Septal infarct (cited on or before 29-Apr-2023) When compared with ECG of 29-Apr-2023 13:12, Premature atrial complexes are now Present  Confirmed by Miriam Norris [phone]) on 05/31/2024 3:14:41 PM   [facility] stress echo from June 2017: LVEF 60 to 65%, no inducible ischemia.  No new ST segment depressions.  Adequate stress test.  Physical Exam:   VS:  BP 114/70   Pulse 80   Ht 5' 8 (1.727 m)   Wt 192 lb (87.1 kg) Comment: on home scales - declined to weigh in office  SpO2 95%   BMI 29.19 kg/m    Wt Readings from Last 3 Encounters:  05/31/24 192 lb (87.1 kg)  06/12/23 196 lb (88.9 kg)  04/29/23 193 lb (87.5 kg)    GEN: Well nourished, well developed in no acute distress NECK: No JVD; No carotid bruits CARDIAC: S1/S2, RRR, no murmurs, rubs, gallops RESPIRATORY:  Clear to auscultation without rales, wheezing or rhonchi  ABDOMEN: Soft, non-tender, non-distended EXTREMITIES:  No edema; No deformity   ASSESSMENT AND PLAN: .    PAF Denies any tachycardia or palpitations. HR normal today. In the past, HR has been well controlled without AV nodal agents. Will continue to monitor. No medication changes at this time. Heart healthy diet and regular cardiovascular exercise encouraged.   HTN BP stable. Discussed to monitor BP at home at least 2 hours after medications and sitting for 5-10 minutes. Continue current medication regimen. Heart healthy diet and regular cardiovascular exercise encouraged.   Breast Cancer PALB2-related breast cancer. Continue to follow-up with Oncology and medical team at office. Continue to follow with PCP. Support given.  4. Pre-operative cardiovascular risk assessment Ms.  Figueira's perioperative risk of a major cardiac event is 0.9% according to the Revised Cardiac Risk Index (RCRI).  Therefore, she is at low risk for perioperative complications.   Her functional capacity is fair at 4.64 METs according to the Duke Activity Status Index (DASI). Recommendations: According to ACC/AHA guidelines, no further cardiovascular testing needed.  The patient may proceed to surgery at acceptable risk.    Antiplatelet and/or Anticoagulation Recommendations: Will route note to clinical Pharm.D. regarding holding time of Xarelto  prior to procedure and will addendum note and route to requesting party once I have this information.    Dispo: Follow-up with Dr. Dorn Ross or APP in 1 year or sooner if anything changes.   Signed, Almarie Crate, NP

## 2024-05-31 NOTE — Patient Instructions (Addendum)

## 2024-05-31 NOTE — Telephone Encounter (Signed)
   Pre-operative Risk Assessment    Patient Name: Dawn Anderson  DOB: [birth year] MRN: [id number]   Date of last office visit: 04/29/23 Almarie Crate Date of next office visit: 07/09/24 with Almarie Crate   Request for Surgical Clearance    Procedure:  Injection 06/15/24, PLIF Surgery Pending  Date of Surgery:  Clearance 06/15/24                                 Surgeon:  Dr. Victory Gens Surgeon's Group or Practice Name:  [facility] Phone number:  [phone] Fax number:  [phone]   Type of Clearance Requested:   - Medical  - Pharmacy:  Hold Rivaroxaban  (Xarelto ) not indicated   Type of Anesthesia:  Not Indicated   Additional requests/questions:    Bonney Augustin JONETTA Delores   05/31/2024, 8:22 AM

## 2024-05-31 NOTE — Telephone Encounter (Signed)
   Name: MEGEN MADEWELL  DOB: 12-05-1945  MRN: [id number]  Primary Cardiologist: Alvan Carrier, MD  Chart reviewed as part of pre-operative protocol coverage. Because of Izzy Doubek Deem's past medical history and time since last visit, she will require a follow-up in-office visit in order to better assess preoperative cardiovascular risk.  Last seen on 04/29/2023 which has been over 1 year.  She will require an in office visit.  She is scheduled to see Almarie Crate on 07/09/2024 however surgery is 06/15/2024.  Pre-op covering staff: - Please schedule appointment and call patient to inform them. If patient already had an upcoming appointment within acceptable timeframe, please add pre-op clearance to the appointment notes so provider is aware. - Please contact requesting surgeon's office via preferred method (i.e, phone, fax) to inform them of need for appointment prior to surgery.  This message will also be routed to pharmacy pool for input on holding Xarelto  as requested below so that this information is available to the clearing provider at time of patient's appointment.   Lum LITTIE Louis, NP  05/31/2024, 8:38 AM

## 2024-05-31 NOTE — Telephone Encounter (Signed)
 I have reviewed the [facility] schedules to see where I could get the pt in sooner in time for preop clearance.   I will send a message to the [facility] schedulers to please see where they can get the pt in sooner as sh now needs preop clearance.

## 2024-05-31 NOTE — Telephone Encounter (Signed)
 Per scheduling team the pt has been scheduled sooner appt in [place] office with Laymon Qua, PAC.

## 2024-06-01 DIAGNOSIS — M79671 Pain in right foot: Secondary | ICD-10-CM | POA: Diagnosis not present

## 2024-06-02 ENCOUNTER — Ambulatory Visit: Admitting: Student

## 2024-06-02 NOTE — Telephone Encounter (Signed)
 Patient with diagnosis of afib on Xarelto  for anticoagulation.    Procedure: Injection  PLIF Surgery Date of procedure: 06/15/24   CHA2DS2-VASc Score = 5   This indicates a 7.2% annual risk of stroke. The patient's score is based upon: CHF History: 0 HTN History: 1 Diabetes History: 1 Stroke History: 0 Vascular Disease History: 0 Age Score: 2 Gender Score: 1      CrCl 85 ml/min Platelet count 292  Patient has not had an Afib/aflutter ablation in the last 3 months, DCCV within the last 4 weeks or a watchman implanted in the last 45 days   Per office protocol, patient can hold Xarelto  for 3 days prior to procedure.     **This guidance is not considered finalized until pre-operative APP has relayed final recommendations.**

## 2024-06-03 ENCOUNTER — Telehealth: Payer: Self-pay | Admitting: *Deleted

## 2024-06-03 NOTE — Telephone Encounter (Signed)
-----   Message from Almarie Crate sent at 06/03/2024  4:41 PM EDT ----- Please let patient know that I have heard back from clinical pharmacist and she can hold her blood thinner 3 days prior to upcoming procedure.   Thanks!   Best, Almarie Crate, NP

## 2024-06-03 NOTE — Telephone Encounter (Signed)
Pt notified and agrees with plan of care.  

## 2024-06-04 DIAGNOSIS — E782 Mixed hyperlipidemia: Secondary | ICD-10-CM | POA: Diagnosis not present

## 2024-06-04 DIAGNOSIS — J4550 Severe persistent asthma, uncomplicated: Secondary | ICD-10-CM | POA: Diagnosis not present

## 2024-06-04 DIAGNOSIS — E1129 Type 2 diabetes mellitus with other diabetic kidney complication: Secondary | ICD-10-CM | POA: Diagnosis not present

## 2024-06-09 DIAGNOSIS — Q2730 Arteriovenous malformation, site unspecified: Secondary | ICD-10-CM | POA: Diagnosis not present

## 2024-06-10 ENCOUNTER — Telehealth (HOSPITAL_BASED_OUTPATIENT_CLINIC_OR_DEPARTMENT_OTHER): Payer: Self-pay

## 2024-06-10 NOTE — Telephone Encounter (Signed)
 Preoperative clearance addressed during office visit on 05/31/2025, will re-route to requesting surgeons office and remove from pre-operative pool.

## 2024-06-10 NOTE — Telephone Encounter (Signed)
   Pre-operative Risk Assessment    Patient Name: Dawn Anderson  DOB: [birth year] MRN: [id number]   Date of last office visit: 05/31/24 With Miriam Date of next office visit: Na   Request for Surgical Clearance    Procedure:  Excision of right foot varix   Date of Surgery:  Clearance 07/06/24                                 Surgeon:  Dr. Norleen Armor Surgeon's Group or Practice Name:  [facility] Phone number:  [phone] Fax number:  [phone]- Duwaine Moats   Type of Clearance Requested:   - Medical  - Pharmacy:  Hold Rivaroxaban  (Xarelto ) not indicated   Type of Anesthesia:  Choice   Additional requests/questions:    Bonney Augustin JONETTA Delores   06/10/2024, 10:49 AM

## 2024-06-11 DIAGNOSIS — I890 Lymphedema, not elsewhere classified: Secondary | ICD-10-CM | POA: Diagnosis not present

## 2024-06-14 DIAGNOSIS — J4550 Severe persistent asthma, uncomplicated: Secondary | ICD-10-CM | POA: Diagnosis not present

## 2024-06-14 DIAGNOSIS — M5442 Lumbago with sciatica, left side: Secondary | ICD-10-CM | POA: Diagnosis not present

## 2024-06-15 DIAGNOSIS — M5116 Intervertebral disc disorders with radiculopathy, lumbar region: Secondary | ICD-10-CM | POA: Diagnosis not present

## 2024-06-15 DIAGNOSIS — M5416 Radiculopathy, lumbar region: Secondary | ICD-10-CM | POA: Diagnosis not present

## 2024-07-02 DIAGNOSIS — J4550 Severe persistent asthma, uncomplicated: Secondary | ICD-10-CM | POA: Diagnosis not present

## 2024-07-02 DIAGNOSIS — E782 Mixed hyperlipidemia: Secondary | ICD-10-CM | POA: Diagnosis not present

## 2024-07-02 DIAGNOSIS — E1129 Type 2 diabetes mellitus with other diabetic kidney complication: Secondary | ICD-10-CM | POA: Diagnosis not present

## 2024-07-06 ENCOUNTER — Other Ambulatory Visit: Payer: Self-pay | Admitting: Orthopedic Surgery

## 2024-07-06 DIAGNOSIS — Q2739 Arteriovenous malformation, other site: Secondary | ICD-10-CM | POA: Diagnosis not present

## 2024-07-06 DIAGNOSIS — D4819 Other specified neoplasm of uncertain behavior of connective and other soft tissue: Secondary | ICD-10-CM | POA: Diagnosis not present

## 2024-07-08 LAB — SURGICAL PATHOLOGY

## 2024-07-09 ENCOUNTER — Ambulatory Visit: Admitting: Nurse Practitioner
